# Patient Record
Sex: Male | Born: 1950
Health system: Southern US, Community
[De-identification: ages and names within clinical notes are randomized; demographics above are authoritative.]

## PROBLEM LIST (undated history)

## (undated) DIAGNOSIS — K219 Gastro-esophageal reflux disease without esophagitis: Secondary | ICD-10-CM

## (undated) DIAGNOSIS — R918 Other nonspecific abnormal finding of lung field: Secondary | ICD-10-CM

## (undated) DIAGNOSIS — Z77090 Contact with and (suspected) exposure to asbestos: Secondary | ICD-10-CM

## (undated) DIAGNOSIS — I10 Essential (primary) hypertension: Secondary | ICD-10-CM

## (undated) DIAGNOSIS — J449 Chronic obstructive pulmonary disease, unspecified: Secondary | ICD-10-CM

## (undated) HISTORY — DX: Gastro-esophageal reflux disease without esophagitis: K21.9

---

## 1998-04-10 ENCOUNTER — Ambulatory Visit (HOSPITAL_COMMUNITY): Admission: RE | Admit: 1998-04-10 | Discharge: 1998-04-10 | Payer: Self-pay | Admitting: Cardiology

## 1999-01-20 ENCOUNTER — Ambulatory Visit (HOSPITAL_COMMUNITY): Admission: RE | Admit: 1999-01-20 | Discharge: 1999-01-20 | Payer: Self-pay | Admitting: *Deleted

## 1999-01-20 ENCOUNTER — Encounter: Payer: Self-pay | Admitting: Family Medicine

## 1999-03-08 ENCOUNTER — Ambulatory Visit: Admission: RE | Admit: 1999-03-08 | Discharge: 1999-03-08 | Payer: Self-pay | Admitting: Internal Medicine

## 2007-11-20 ENCOUNTER — Ambulatory Visit: Payer: Self-pay | Admitting: Internal Medicine

## 2007-12-03 ENCOUNTER — Encounter: Payer: Self-pay | Admitting: Internal Medicine

## 2007-12-03 ENCOUNTER — Ambulatory Visit: Payer: Self-pay | Admitting: Internal Medicine

## 2010-12-21 ENCOUNTER — Encounter: Payer: Self-pay | Admitting: Internal Medicine

## 2011-01-20 NOTE — Letter (Signed)
Summary: Colonoscopy Letter  [facility]  [address]   [place], [place] [address]   Phone: [phone]  Fax: [phone]      December 21, 2010 MRN: [id number]   ANGELICA WIX [address] [place]  [address]   Dear Mr. SELSOR,   According to your medical record, it is time for you to schedule a Colonoscopy. The American Cancer Society recommends this procedure as a method to detect early colon cancer. Patients with a family history of colon cancer, or a personal history of colon polyps or inflammatory bowel disease are at increased risk.  This letter has been generated based on the recommendations made at the time of your procedure. If you feel that in your particular situation this may no longer apply, please contact our office.  Please call our office at [phone] to schedule this appointment or to update your records at your earliest convenience.  Thank you for cooperating with [place] to provide you with the very best care possible.   Sincerely,   Stan Head, M.D.  [facility] [phone]

## 2012-09-04 ENCOUNTER — Encounter: Payer: Self-pay | Admitting: Internal Medicine

## 2013-05-07 ENCOUNTER — Other Ambulatory Visit: Payer: Self-pay

## 2013-05-07 MED ORDER — OMEPRAZOLE 40 MG PO CPDR: 40.0000 mg | DELAYED_RELEASE_CAPSULE | Freq: Every day | ORAL | Status: DC

## 2013-05-09 ENCOUNTER — Other Ambulatory Visit: Payer: Self-pay

## 2013-05-09 MED ORDER — OMEPRAZOLE 40 MG PO CPDR: 40.0000 mg | DELAYED_RELEASE_CAPSULE | Freq: Every day | ORAL | Status: DC

## 2013-05-09 NOTE — Telephone Encounter (Signed)
Reorder on 05/07/13 for 30 but pharmacy sending back asking for 90 capsules for insurance to pay.  Patient has not been seen since 11/13

## 2013-12-24 ENCOUNTER — Other Ambulatory Visit: Payer: Self-pay | Admitting: Nurse Practitioner

## 2013-12-31 ENCOUNTER — Other Ambulatory Visit: Payer: Self-pay | Admitting: *Deleted

## 2013-12-31 MED ORDER — HYDROCHLOROTHIAZIDE 12.5 MG PO TABS: 12.5000 mg | ORAL_TABLET | Freq: Every day | ORAL | Status: DC

## 2013-12-31 MED ORDER — OMEPRAZOLE 40 MG PO CPDR: 40.0000 mg | DELAYED_RELEASE_CAPSULE | Freq: Every day | ORAL | Status: DC

## 2014-01-27 ENCOUNTER — Encounter: Payer: Self-pay | Admitting: Family Medicine

## 2014-01-27 ENCOUNTER — Ambulatory Visit (INDEPENDENT_AMBULATORY_CARE_PROVIDER_SITE_OTHER): Payer: 59 | Admitting: Family Medicine

## 2014-01-27 VITALS — BP 137/87 | HR 68 | Temp 97.3°F | Ht 69.5 in | Wt 168.0 lb

## 2014-01-27 DIAGNOSIS — I10 Essential (primary) hypertension: Secondary | ICD-10-CM

## 2014-01-27 LAB — POCT CBC
Granulocyte percent: 58.5 %G (ref 37–80)
HCT, POC: 45.3 % (ref 43.5–53.7)
Hemoglobin: 14.2 g/dL (ref 14.1–18.1)
Lymph, poc: 2.2 (ref 0.6–3.4)
MCH, POC: 26.2 pg — AB (ref 27–31.2)
MCHC: 31.4 g/dL — AB (ref 31.8–35.4)
MCV: 83.3 fL (ref 80–97)
MPV: 9 fL (ref 0–99.8)
POC Granulocyte: 3.2 (ref 2–6.9)
POC LYMPH PERCENT: 39.7 %L (ref 10–50)
Platelet Count, POC: 164 10*3/uL (ref 142–424)
RBC: 5.4 M/uL (ref 4.69–6.13)
RDW, POC: 14 %
WBC: 5.5 10*3/uL (ref 4.6–10.2)

## 2014-01-27 MED ORDER — OMEPRAZOLE 40 MG PO CPDR: 40.0000 mg | DELAYED_RELEASE_CAPSULE | Freq: Every day | ORAL | Status: DC

## 2014-01-27 MED ORDER — HYDROCHLOROTHIAZIDE 12.5 MG PO TABS: 12.5000 mg | ORAL_TABLET | Freq: Every day | ORAL | Status: DC

## 2014-01-27 NOTE — Patient Instructions (Signed)
c Hypertension As your heart beats, it forces blood through your arteries. This force is your blood pressure. If the pressure is too high, it is called hypertension (HTN) or high blood pressure. HTN is dangerous because you may have it and not know it. High blood pressure may mean that your heart has to work harder to pump blood. Your arteries may be narrow or stiff. The extra work puts you at risk for heart disease, stroke, and other problems.  Blood pressure consists of two numbers, a higher number over a lower, 110/72, for example. It is stated as "110 over 72." The ideal is below 120 for the top number (systolic) and under 80 for the bottom (diastolic). Write down your blood pressure today. You should pay close attention to your blood pressure if you have certain conditions such as:  Heart failure.  Prior heart attack.  Diabetes  Chronic kidney disease.  Prior stroke.  Multiple risk factors for heart disease. To see if you have HTN, your blood pressure should be measured while you are seated with your arm held at the level of the heart. It should be measured at least twice. A one-time elevated blood pressure reading (especially in the Emergency Department) does not mean that you need treatment. There may be conditions in which the blood pressure is different between your right and left arms. It is important to see your caregiver soon for a recheck. Most people have essential hypertension which means that there is not a specific cause. This type of high blood pressure may be lowered by changing lifestyle factors such as:  Stress.  Smoking.  Lack of exercise.  Excessive weight.  Drug/tobacco/alcohol use.  Eating less salt. Most people do not have symptoms from high blood pressure until it has caused damage to the body. Effective treatment can often prevent, delay or reduce that damage. TREATMENT  When a cause has been identified, treatment for high blood pressure is directed at the  cause. There are a large number of medications to treat HTN. These fall into several categories, and your caregiver will help you select the medicines that are best for you. Medications may have side effects. You should review side effects with your caregiver. If your blood pressure stays high after you have made lifestyle changes or started on medicines,   Your medication(s) may need to be changed.  Other problems may need to be addressed.  Be certain you understand your prescriptions, and know how and when to take your medicine.  Be sure to follow up with your caregiver within the time frame advised (usually within two weeks) to have your blood pressure rechecked and to review your medications.  If you are taking more than one medicine to lower your blood pressure, make sure you know how and at what times they should be taken. Taking two medicines at the same time can result in blood pressure that is too low. SEEK IMMEDIATE MEDICAL CARE IF:  You develop a severe headache, blurred or changing vision, or confusion.  You have unusual weakness or numbness, or a faint feeling.  You have severe chest or abdominal pain, vomiting, or breathing problems. MAKE SURE YOU:   Understand these instructions.  Will watch your condition.  Will get help right away if you are not doing well or get worse. Document Released: 12/05/2005 Document Revised: 02/27/2012 Document Reviewed: 07/25/2008 [facility] Patient Information 2014 [place], [place].

## 2014-01-27 NOTE — Progress Notes (Signed)
   Subjective:    Patient ID: JACOBEY GURA, male    DOB: [birth year], 63 y.o.   MRN: [id number]  HPI  This 63 y.o. male presents for evaluation of needing refill on meds.  He has hx of GERD and hypertensoin.  Review of Systems No chest pain, SOB, HA, dizziness, vision change, N/V, diarrhea, constipation, dysuria, urinary urgency or frequency, myalgias, arthralgias or rash.     Objective:   Physical Exam  Vital signs noted  Well developed well nourished male.  HEENT - Head atraumatic Normocephalic                Eyes - PERRLA, Conjuctiva - clear Sclera- Clear EOMI                Ears - EAC's Wnl TM's Wnl Gross Hearing WNL                Nose - Nares patent                 Throat - oropharanx wnl Respiratory - Lungs CTA bilateral Cardiac - RRR S1 and S2 without murmur GI - Abdomen soft Nontender and bowel sounds active x 4 Extremities - No edema. Neuro - Grossly intact.      Assessment & Plan:  Essential hypertension, benign - Plan: POCT CBC, CMP14+EGFR, TSH, PSA, total and free, Lipid panel  Lysbeth Penner FNP

## 2014-01-28 LAB — CMP14+EGFR
ALT: 16 IU/L (ref 0–44)
AST: 19 IU/L (ref 0–40)
Albumin/Globulin Ratio: 1.3 (ref 1.1–2.5)
Albumin: 4 g/dL (ref 3.6–4.8)
Alkaline Phosphatase: 72 IU/L (ref 39–117)
BUN/Creatinine Ratio: 10 (ref 10–22)
BUN: 12 mg/dL (ref 8–27)
CO2: 23 mmol/L (ref 18–29)
Calcium: 9.6 mg/dL (ref 8.6–10.2)
Chloride: 100 mmol/L (ref 97–108)
Creatinine, Ser: 1.24 mg/dL (ref 0.76–1.27)
GFR calc Af Amer: 71 mL/min/{1.73_m2} (ref >59)
GFR calc non Af Amer: 61 mL/min/{1.73_m2} (ref >59)
Globulin, Total: 3.1 g/dL (ref 1.5–4.5)
Glucose: 99 mg/dL (ref 65–99)
Potassium: 4.6 mmol/L (ref 3.5–5.2)
Sodium: 141 mmol/L (ref 134–144)
Total Bilirubin: 0.2 mg/dL (ref 0.0–1.2)
Total Protein: 7.1 g/dL (ref 6.0–8.5)

## 2014-01-28 LAB — LIPID PANEL
Chol/HDL Ratio: 3.9 ratio units (ref 0.0–5.0)
Cholesterol, Total: 185 mg/dL (ref 100–199)
HDL: 47 mg/dL (ref >39)
LDL Calculated: 128 mg/dL — ABNORMAL HIGH (ref 0–99)
Triglycerides: 50 mg/dL (ref 0–149)
VLDL Cholesterol Cal: 10 mg/dL (ref 5–40)

## 2014-01-28 LAB — PSA, TOTAL AND FREE
PSA, Free Pct: 31.9 %
PSA, Free: 0.51 ng/mL
PSA: 1.6 ng/mL (ref 0.0–4.0)

## 2014-01-28 LAB — TSH: TSH: 0.852 u[IU]/mL (ref 0.450–4.500)

## 2014-03-19 ENCOUNTER — Other Ambulatory Visit: Payer: Self-pay | Admitting: *Deleted

## 2014-03-19 MED ORDER — HYDROCHLOROTHIAZIDE 12.5 MG PO TABS: 12.5000 mg | ORAL_TABLET | Freq: Every day | ORAL | Status: DC

## 2014-04-26 ENCOUNTER — Other Ambulatory Visit: Payer: Self-pay | Admitting: Family Medicine

## 2014-08-15 ENCOUNTER — Other Ambulatory Visit: Payer: Self-pay | Admitting: Family Medicine

## 2014-11-12 ENCOUNTER — Other Ambulatory Visit: Payer: Self-pay | Admitting: Family Medicine

## 2014-11-12 MED ORDER — OMEPRAZOLE 40 MG PO CPDR
40.0000 mg | DELAYED_RELEASE_CAPSULE | Freq: Every day | ORAL | Status: DC
Start: 2014-11-12 — End: 2015-01-14

## 2014-11-12 MED ORDER — HYDROCHLOROTHIAZIDE 12.5 MG PO TABS: 12.5000 mg | ORAL_TABLET | Freq: Every day | ORAL | Status: DC

## 2014-11-12 NOTE — Telephone Encounter (Signed)
done

## 2015-01-14 ENCOUNTER — Other Ambulatory Visit: Payer: Self-pay | Admitting: *Deleted

## 2015-01-14 MED ORDER — OMEPRAZOLE 40 MG PO CPDR: 40.0000 mg | DELAYED_RELEASE_CAPSULE | Freq: Every day | ORAL | Status: DC

## 2015-01-27 ENCOUNTER — Ambulatory Visit (INDEPENDENT_AMBULATORY_CARE_PROVIDER_SITE_OTHER): Payer: 59 | Admitting: Family Medicine

## 2015-01-27 ENCOUNTER — Encounter: Payer: Self-pay | Admitting: Family Medicine

## 2015-01-27 VITALS — BP 151/90 | HR 104 | Temp 97.8°F | Ht 69.5 in | Wt 169.0 lb

## 2015-01-27 DIAGNOSIS — K2100 Gastro-esophageal reflux disease with esophagitis, without bleeding: Secondary | ICD-10-CM

## 2015-01-27 DIAGNOSIS — I10 Essential (primary) hypertension: Secondary | ICD-10-CM

## 2015-01-27 NOTE — Progress Notes (Signed)
Subjective:  Patient ID: Zachary [name], male    DOB: 1951-07-26  Age: 64 y.o. MRN: [id number]  CC: Hypertension and Gastrophageal Reflux   HPI Zachary [name] presents for Patient in for follow-up of hypertension. Patient has no history of headache chest pain or shortness of breath or recent cough. Patient also denies symptoms of TIA such as numbness weakness lateralizing. Patient checks  blood pressure at home and has not had any elevated readings recently. Patient denies side effects from his medication. States taking it regularly. Patient in for follow-up of GERD. He is currently asymptomatic taking his PPI daily. There is no chest pain or heartburn. He is taking the medication regularly. No dysphagia or choking. Patient apparently has history of elevated cholesterol currently not taking medication. He is not fasting.  History Vicki has no past medical history on file.   He has no past surgical history on file.   His family history is not on file.He reports that he has been smoking Cigarettes.  He started smoking about 41 years ago. He has been smoking about 0.10 packs per day. He does not have any smokeless tobacco history on file. He reports that he does not drink alcohol or use illicit drugs.  Current Outpatient Prescriptions on File Prior to Visit  Medication Sig Dispense Refill  . omeprazole (PRILOSEC) 40 MG capsule Take 1 capsule (40 mg total) by mouth daily. 30 capsule 0   No current facility-administered medications on file prior to visit.    ROS Review of Systems  Constitutional: Negative for fever, chills, diaphoresis and unexpected weight change.  HENT: Negative for congestion, hearing loss, rhinorrhea, sore throat and trouble swallowing.   Respiratory: Negative for cough, chest tightness, shortness of breath and wheezing.   Gastrointestinal: Negative for nausea, vomiting, abdominal pain, diarrhea, constipation and abdominal distention.  Endocrine: Negative for  cold intolerance and heat intolerance.  Genitourinary: Negative for dysuria, hematuria and flank pain.  Musculoskeletal: Negative for joint swelling and arthralgias.  Skin: Negative for rash.  Neurological: Negative for dizziness and headaches.  Psychiatric/Behavioral: Negative for dysphoric mood, decreased concentration and agitation. The patient is not nervous/anxious.     Objective:  BP 151/90 mmHg  Pulse 104  Temp(Src) 97.8 F (36.6 C) (Oral)  Ht 5' 9.5" (1.765 m)  Wt 169 lb (76.658 kg)  BMI 24.61 kg/m2  BP Readings from Last 3 Encounters:  01/27/15 151/90  01/27/14 137/87    Wt Readings from Last 3 Encounters:  01/27/15 169 lb (76.658 kg)  01/27/14 168 lb (76.204 kg)     Physical Exam  Constitutional: He is oriented to person, place, and time. He appears well-developed and well-nourished. No distress.  HENT:  Head: Normocephalic and atraumatic.  Right Ear: External ear normal.  Left Ear: External ear normal.  Nose: Nose normal.  Mouth/Throat: Oropharynx is clear and moist.  Eyes: Conjunctivae and EOM are normal. Pupils are equal, round, and reactive to light.  Neck: Normal range of motion. Neck supple. No thyromegaly present.  Cardiovascular: Normal rate, regular rhythm and normal heart sounds.   No murmur heard. Pulmonary/Chest: Effort normal and breath sounds normal. No respiratory distress. He has no wheezes. He has no rales.  Abdominal: Soft. Bowel sounds are normal. He exhibits no distension. There is no tenderness.  Lymphadenopathy:    He has no cervical adenopathy.  Neurological: He is alert and oriented to person, place, and time. He has normal reflexes.  Skin: Skin is warm and dry.  Psychiatric:  He has a normal mood and affect. His behavior is normal. Judgment and thought content normal.    No results found for: HGBA1C  Lab Results  Component Value Date   WBC 5.5 01/27/2014   HGB 14.2 01/27/2014   HCT 45.3 01/27/2014   GLUCOSE 99 01/27/2014    TRIG 50 01/27/2014   HDL 47 01/27/2014   LDLCALC 128* 01/27/2014   ALT 16 01/27/2014   AST 19 01/27/2014   NA 141 01/27/2014   K 4.6 01/27/2014   CL 100 01/27/2014   CREATININE 1.24 01/27/2014   BUN 12 01/27/2014   CO2 23 01/27/2014   TSH 0.852 01/27/2014   PSA 1.6 01/27/2014    No results found.  Assessment & Plan:   Sohail was seen today for hypertension and gastrophageal reflux.  Diagnoses and associated orders for this visit:  Essential hypertension, benign - CMP14+EGFR  Gastroesophageal reflux disease with esophagitis - CMP14+EGFR   I have discontinued Mr. Patella's hydrochlorothiazide. I am also having him maintain his omeprazole and hydrochlorothiazide.  Meds ordered this encounter  Medications  . hydrochlorothiazide (MICROZIDE) 12.5 MG capsule    Sig: Take 12.5 mg by mouth daily.    Refill:  0    Comments: Continue meds as is. Patient encouraged to follow DASH eating plan. Instructions printed. Monitor blood pressure closely at home. Log the results and bring them back to your follow-up in 6 months.  Follow-up: Return in about 6 months (around 07/28/2015) for CPE, cholesterol.  Claretta Fraise, M.D.

## 2015-01-27 NOTE — Patient Instructions (Signed)
DASH Eating Plan °DASH stands for "Dietary Approaches to Stop Hypertension." The DASH eating plan is a healthy eating plan that has been shown to reduce high blood pressure (hypertension). Additional health benefits may include reducing the risk of type 2 diabetes mellitus, heart disease, and stroke. The DASH eating plan may also help with weight loss. °WHAT DO I NEED TO KNOW ABOUT THE DASH EATING PLAN? °For the DASH eating plan, you will follow these general guidelines: °· Choose foods with a percent daily value for sodium of less than 5% (as listed on the food label). °· Use salt-free seasonings or herbs instead of table salt or sea salt. °· Check with your health care provider or pharmacist before using salt substitutes. °· Eat lower-sodium products, often labeled as "lower sodium" or "no salt added." °· Eat fresh foods. °· Eat more vegetables, fruits, and low-fat dairy products. °· Choose whole grains. Look for the word "whole" as the first word in the ingredient list. °· Choose fish and skinless chicken or turkey more often than red meat. Limit fish, poultry, and meat to 6 oz (170 g) each day. °· Limit sweets, desserts, sugars, and sugary drinks. °· Choose heart-healthy fats. °· Limit cheese to 1 oz (28 g) per day. °· Eat more home-cooked food and less restaurant, buffet, and fast food. °· Limit fried foods. °· Cook foods using methods other than frying. °· Limit canned vegetables. If you do use them, rinse them well to decrease the sodium. °· When eating at a restaurant, ask that your food be prepared with less salt, or no salt if possible. °WHAT FOODS CAN I EAT? °Seek help from a dietitian for individual calorie needs. °Grains °Whole grain or whole wheat bread. Brown rice. Whole grain or whole wheat pasta. Quinoa, bulgur, and whole grain cereals. Low-sodium cereals. Corn or whole wheat flour tortillas. Whole grain cornbread. Whole grain crackers. Low-sodium crackers. °Vegetables °Fresh or frozen vegetables  (raw, steamed, roasted, or grilled). Low-sodium or reduced-sodium tomato and vegetable juices. Low-sodium or reduced-sodium tomato sauce and paste. Low-sodium or reduced-sodium canned vegetables.  °Fruits °All fresh, canned (in natural juice), or frozen fruits. °Meat and Other Protein Products °Ground beef (85% or leaner), grass-fed beef, or beef trimmed of fat. Skinless chicken or turkey. Ground chicken or turkey. Pork trimmed of fat. All fish and seafood. Eggs. Dried beans, peas, or lentils. Unsalted nuts and seeds. Unsalted canned beans. °Dairy °Low-fat dairy products, such as skim or 1% milk, 2% or reduced-fat cheeses, low-fat ricotta or cottage cheese, or plain low-fat yogurt. Low-sodium or reduced-sodium cheeses. °Fats and Oils °Tub margarines without trans fats. Light or reduced-fat mayonnaise and salad dressings (reduced sodium). Avocado. Safflower, olive, or canola oils. Natural peanut or almond butter. °Other °Unsalted popcorn and pretzels. °The items listed above may not be a complete list of recommended foods or beverages. Contact your dietitian for more options. °WHAT FOODS ARE NOT RECOMMENDED? °Grains °White bread. White pasta. White rice. Refined cornbread. Bagels and croissants. Crackers that contain trans fat. °Vegetables °Creamed or fried vegetables. Vegetables in a cheese sauce. Regular canned vegetables. Regular canned tomato sauce and paste. Regular tomato and vegetable juices. °Fruits °Dried fruits. Canned fruit in light or heavy syrup. Fruit juice. °Meat and Other Protein Products °Fatty cuts of meat. Ribs, chicken wings, bacon, sausage, bologna, salami, chitterlings, fatback, hot dogs, bratwurst, and packaged luncheon meats. Salted nuts and seeds. Canned beans with salt. °Dairy °Whole or 2% milk, cream, half-and-half, and cream cheese. Whole-fat or sweetened yogurt. Full-fat   cheeses or blue cheese. Nondairy creamers and whipped toppings. Processed cheese, cheese spreads, or cheese  curds. °Condiments °Onion and garlic salt, seasoned salt, table salt, and sea salt. Canned and packaged gravies. Worcestershire sauce. Tartar sauce. Barbecue sauce. Teriyaki sauce. Soy sauce, including reduced sodium. Steak sauce. Fish sauce. Oyster sauce. Cocktail sauce. Horseradish. Ketchup and mustard. Meat flavorings and tenderizers. Bouillon cubes. Hot sauce. Tabasco sauce. Marinades. Taco seasonings. Relishes. °Fats and Oils °Butter, stick margarine, lard, shortening, ghee, and bacon fat. Coconut, palm kernel, or palm oils. Regular salad dressings. °Other °Pickles and olives. Salted popcorn and pretzels. °The items listed above may not be a complete list of foods and beverages to avoid. Contact your dietitian for more information. °WHERE CAN I FIND MORE INFORMATION? °National Heart, [employer], and [employer] Institute: www.nhlbi.nih.gov/health/health-topics/topics/dash/ °Document Released: 11/24/2011 Document Revised: 04/21/2014 Document Reviewed: 10/09/2013 °ExitCare® Patient Information ©2015 [employer]. This information is not intended to replace advice given to you by your health care provider. Make sure you discuss any questions you have with your health care provider. ° °

## 2015-01-28 LAB — CMP14+EGFR
A/G RATIO: 1.1 (ref 1.1–2.5)
ALT: 12 IU/L (ref 0–44)
AST: 16 IU/L (ref 0–40)
Albumin: 4.1 g/dL (ref 3.6–4.8)
Alkaline Phosphatase: 79 IU/L (ref 39–117)
BILIRUBIN TOTAL: 0.4 mg/dL (ref 0.0–1.2)
BUN / CREAT RATIO: 10 (ref 10–22)
BUN: 13 mg/dL (ref 8–27)
CO2: 26 mmol/L (ref 18–29)
Calcium: 9.5 mg/dL (ref 8.6–10.2)
Chloride: 100 mmol/L (ref 97–108)
Creatinine, Ser: 1.25 mg/dL (ref 0.76–1.27)
GFR calc non Af Amer: 60 mL/min/{1.73_m2} (ref >59)
GFR, EST AFRICAN AMERICAN: 70 mL/min/{1.73_m2} (ref >59)
Globulin, Total: 3.7 g/dL (ref 1.5–4.5)
Glucose: 75 mg/dL (ref 65–99)
Potassium: 4.4 mmol/L (ref 3.5–5.2)
SODIUM: 141 mmol/L (ref 134–144)
TOTAL PROTEIN: 7.8 g/dL (ref 6.0–8.5)

## 2015-01-30 ENCOUNTER — Encounter: Payer: Self-pay | Admitting: Family Medicine

## 2015-01-30 ENCOUNTER — Telehealth: Payer: Self-pay | Admitting: *Deleted

## 2015-01-30 NOTE — Telephone Encounter (Signed)
-----   Message from Mechele Claude, MD sent at 01/29/2015  2:22 PM EST ----- Zachary [name],    Your lab result is normal.Some minor variations that are not significant are commonly marked abnormal, but do not represent any medical problem for you.  Best regards, Mechele Claude, M.D.

## 2015-01-30 NOTE — Telephone Encounter (Signed)
Called pt to go over test results. 

## 2015-02-11 ENCOUNTER — Other Ambulatory Visit: Payer: Self-pay | Admitting: Family Medicine

## 2015-04-02 ENCOUNTER — Other Ambulatory Visit: Payer: Self-pay | Admitting: *Deleted

## 2015-04-02 MED ORDER — OMEPRAZOLE 40 MG PO CPDR: 40.0000 mg | DELAYED_RELEASE_CAPSULE | Freq: Every day | ORAL | Status: DC

## 2015-04-23 ENCOUNTER — Other Ambulatory Visit: Payer: Self-pay

## 2015-04-23 MED ORDER — OMEPRAZOLE 40 MG PO CPDR: 40.0000 mg | DELAYED_RELEASE_CAPSULE | Freq: Every day | ORAL | Status: DC

## 2015-04-28 ENCOUNTER — Other Ambulatory Visit: Payer: Self-pay | Admitting: Family Medicine

## 2015-07-29 ENCOUNTER — Other Ambulatory Visit: Payer: Self-pay | Admitting: Family Medicine

## 2015-09-29 ENCOUNTER — Other Ambulatory Visit: Payer: Self-pay | Admitting: *Deleted

## 2015-09-29 MED ORDER — HYDROCHLOROTHIAZIDE 12.5 MG PO CAPS: 12.5000 mg | ORAL_CAPSULE | Freq: Every day | ORAL | Status: DC

## 2015-09-29 NOTE — Telephone Encounter (Signed)
Last seen 01/2015 

## 2015-10-07 ENCOUNTER — Telehealth: Payer: Self-pay | Admitting: Family Medicine

## 2015-10-28 ENCOUNTER — Encounter: Payer: 59 | Admitting: Family Medicine

## 2015-10-29 ENCOUNTER — Other Ambulatory Visit: Payer: Self-pay | Admitting: Family Medicine

## 2015-11-03 ENCOUNTER — Encounter: Payer: Self-pay | Admitting: Family Medicine

## 2015-11-03 ENCOUNTER — Ambulatory Visit (INDEPENDENT_AMBULATORY_CARE_PROVIDER_SITE_OTHER): Payer: 59 | Admitting: Family Medicine

## 2015-11-03 VITALS — BP 125/87 | HR 87 | Temp 97.1°F | Ht 69.5 in | Wt 159.8 lb

## 2015-11-03 DIAGNOSIS — I10 Essential (primary) hypertension: Secondary | ICD-10-CM

## 2015-11-03 DIAGNOSIS — Z0000 Encounter for general adult medical examination without abnormal findings: Secondary | ICD-10-CM

## 2015-11-03 DIAGNOSIS — K2100 Gastro-esophageal reflux disease with esophagitis, without bleeding: Secondary | ICD-10-CM

## 2015-11-03 MED ORDER — HYDROCHLOROTHIAZIDE 12.5 MG PO CAPS: 12.5000 mg | ORAL_CAPSULE | Freq: Every day | ORAL | Status: DC

## 2015-11-03 MED ORDER — OMEPRAZOLE 40 MG PO CPDR: DELAYED_RELEASE_CAPSULE | ORAL | Status: DC

## 2015-11-03 NOTE — Patient Instructions (Signed)
Your physical condition was excellent on today's exam.  Final evaluation of course is deferred until you're lab work has come back. You should get a phone call in the next few days.

## 2015-11-03 NOTE — Progress Notes (Signed)
Subjective:  Patient ID: Zachary [name], male    DOB: [birth year]  Age: 64 y.o. MRN: [id number]  CC: Annual Exam  Patient in for follow-up of GERD. Currently asymptomatic taking  PPI daily. There is no chest pain or heartburn. No hematemesis and no melena. No dysphagia or choking. Onset is remote. Progression is stable. Complicating factors, none.  follow-up of hypertension. Patient has no history of headache chest pain or shortness of breath or recent cough. Patient also denies symptoms of TIA such as numbness weakness lateralizing. Patient checks  blood pressure at home and has not had any elevated readings recently. Patient denies side effects from his medication. States taking it regularly.   HPI Zachary [name] presents for annual physical exam.  History Zachary [name] has a past medical history of Hypertension and GERD (gastroesophageal reflux disease).   He denies having had any surgery   His family history includes Heart disease in his father.He reports that he has been smoking Cigarettes.  He started smoking about 41 years ago. He has been smoking about 0.10 packs per day. He does not have any smokeless tobacco history on file. He reports that he does not drink alcohol or use illicit drugs.  Outpatient Prescriptions Prior to Visit  Medication Sig Dispense Refill  . hydrochlorothiazide (MICROZIDE) 12.5 MG capsule Take 1 capsule (12.5 mg total) by mouth daily. 30 capsule 0  . omeprazole (PRILOSEC) 40 MG capsule TAKE 1 CAPSULE (40 MG TOTAL) BY MOUTH DAILY. 90 capsule 0   No facility-administered medications prior to visit.    ROS Review of Systems  Constitutional: Negative for fever, chills, diaphoresis, activity change, appetite change, fatigue and unexpected weight change.  HENT: Negative for congestion, ear pain, hearing loss, postnasal drip, rhinorrhea, sore throat, tinnitus and trouble swallowing.   Eyes: Negative for photophobia, pain, discharge and redness.  Respiratory:  Negative for apnea, cough, choking, chest tightness, shortness of breath, wheezing and stridor.   Cardiovascular: Negative for chest pain, palpitations and leg swelling.  Gastrointestinal: Negative for nausea, vomiting, abdominal pain, diarrhea, constipation, blood in stool and abdominal distention.  Endocrine: Negative for cold intolerance, heat intolerance, polydipsia, polyphagia and polyuria.  Genitourinary: Negative for dysuria, urgency, frequency, hematuria, flank pain, enuresis, difficulty urinating and genital sores.  Musculoskeletal: Negative for joint swelling and arthralgias.  Skin: Negative for color change, rash and wound.  Allergic/Immunologic: Negative for immunocompromised state.  Neurological: Negative for dizziness, tremors, seizures, syncope, facial asymmetry, speech difficulty, weakness, light-headedness, numbness and headaches.  Hematological: Does not bruise/bleed easily.  Psychiatric/Behavioral: Negative for suicidal ideas, hallucinations, behavioral problems, confusion, sleep disturbance, dysphoric mood, decreased concentration and agitation. The patient is not nervous/anxious and is not hyperactive.     Objective:  BP 125/87 mmHg  Pulse 87  Temp(Src) 97.1 F (36.2 C) (Oral)  Ht 5' 9.5" (1.765 m)  Wt 159 lb 12.8 oz (72.485 kg)  BMI 23.27 kg/m2  SpO2 95%  BP Readings from Last 3 Encounters:  11/03/15 125/87  01/27/15 151/90  01/27/14 137/87    Wt Readings from Last 3 Encounters:  11/03/15 159 lb 12.8 oz (72.485 kg)  01/27/15 169 lb (76.658 kg)  01/27/14 168 lb (76.204 kg)     Physical Exam  Constitutional: He is oriented to person, place, and time. He appears well-developed and well-nourished.  HENT:  Head: Normocephalic and atraumatic.  Mouth/Throat: Oropharynx is clear and moist.  Eyes: EOM are normal. Pupils are equal, round, and reactive to light.  Neck: Normal range of motion.  No tracheal deviation present. No thyromegaly present.  Cardiovascular:  Normal rate, regular rhythm and normal heart sounds.  Exam reveals no gallop and no friction rub.   No murmur heard. Pulmonary/Chest: Breath sounds normal. He has no wheezes. He has no rales.  Abdominal: Soft. He exhibits no mass. There is no tenderness.  Genitourinary: Rectum normal, prostate normal and penis normal. No penile tenderness.  Musculoskeletal: Normal range of motion. He exhibits no edema.  Neurological: He is alert and oriented to person, place, and time. He has normal reflexes. No cranial nerve deficit. He exhibits normal muscle tone.  Skin: Skin is warm and dry.  Psychiatric: He has a normal mood and affect.    No results found for: HGBA1C  Lab Results  Component Value Date   WBC 4.6 11/03/2015   HGB 14.2 01/27/2014   HCT 44.2 11/03/2015   GLUCOSE 74 11/03/2015   CHOL 192 11/03/2015   TRIG 96 11/03/2015   HDL 43 11/03/2015   LDLCALC 130* 11/03/2015   ALT 14 11/03/2015   AST 19 11/03/2015   NA 140 11/03/2015   K 4.4 11/03/2015   CL 98 11/03/2015   CREATININE 1.19 11/03/2015   BUN 13 11/03/2015   CO2 26 11/03/2015   TSH 1.180 11/03/2015   PSA 2.6 11/03/2015    No results found.  Assessment & Plan:   Zachary [name] was seen today for annual exam.  Diagnoses and all orders for this visit:  Routine general medical examination at a health care facility -     CBC with Differential/Platelet -     Lipid panel -     PSA, total and free -     CMP14+EGFR -     TSH -     VITAMIN D 25 Hydroxy (Vit-D Deficiency, Fractures) -     Cancel: T4, Free  Gastroesophageal reflux disease with esophagitis  Essential hypertension, benign  Other orders -     hydrochlorothiazide (MICROZIDE) 12.5 MG capsule; Take 1 capsule (12.5 mg total) by mouth daily. -     omeprazole (PRILOSEC) 40 MG capsule; TAKE 1 CAPSULE (40 MG TOTAL) BY MOUTH DAILY.   I am having Mr. Azpeitia maintain his hydrochlorothiazide and omeprazole.  Meds ordered this encounter  Medications  .  hydrochlorothiazide (MICROZIDE) 12.5 MG capsule    Sig: Take 1 capsule (12.5 mg total) by mouth daily.    Dispense:  90 capsule    Refill:  4  . omeprazole (PRILOSEC) 40 MG capsule    Sig: TAKE 1 CAPSULE (40 MG TOTAL) BY MOUTH DAILY.    Dispense:  90 capsule    Refill:  4   Patient was counseled on appropriate diet. Low carbohydrate, low sodium, high protein approach. AVoiding alcohol, drugs reviewed. Discussed smoking cessation.  Immunizations - recommended zostavax, pneumovax, prevnar and TDaP  Regular exercise benefit with mix of cardio and resistance training was reviewed.  Reminded to wear seat belt when driving or a passenger.   Follow-up: Return in about 1 year (around 11/02/2016) for CPE.  Claretta Fraise, M.D.

## 2015-11-04 ENCOUNTER — Other Ambulatory Visit: Payer: Self-pay | Admitting: Family Medicine

## 2015-11-04 LAB — CBC WITH DIFFERENTIAL/PLATELET
BASOS ABS: 0 10*3/uL (ref 0.0–0.2)
Basos: 0 %
EOS (ABSOLUTE): 0.2 10*3/uL (ref 0.0–0.4)
Eos: 3 %
Hematocrit: 44.2 % (ref 37.5–51.0)
Hemoglobin: 15.1 g/dL (ref 12.6–17.7)
Immature Grans (Abs): 0 10*3/uL (ref 0.0–0.1)
Immature Granulocytes: 0 %
LYMPHS ABS: 1.9 10*3/uL (ref 0.7–3.1)
Lymphs: 42 %
MCH: 28.7 pg (ref 26.6–33.0)
MCHC: 34.2 g/dL (ref 31.5–35.7)
MCV: 84 fL (ref 79–97)
Monocytes Absolute: 0.4 10*3/uL (ref 0.1–0.9)
Monocytes: 8 %
Neutrophils Absolute: 2.1 10*3/uL (ref 1.4–7.0)
Neutrophils: 47 %
Platelets: 216 10*3/uL (ref 150–379)
RBC: 5.26 x10E6/uL (ref 4.14–5.80)
RDW: 13.7 % (ref 12.3–15.4)
WBC: 4.6 10*3/uL (ref 3.4–10.8)

## 2015-11-04 LAB — CMP14+EGFR
ALT: 14 IU/L (ref 0–44)
AST: 19 IU/L (ref 0–40)
Albumin/Globulin Ratio: 1 — ABNORMAL LOW (ref 1.1–2.5)
Albumin: 4.1 g/dL (ref 3.6–4.8)
Alkaline Phosphatase: 82 IU/L (ref 39–117)
BUN/Creatinine Ratio: 11 (ref 10–22)
BUN: 13 mg/dL (ref 8–27)
Bilirubin Total: 0.4 mg/dL (ref 0.0–1.2)
CALCIUM: 9.7 mg/dL (ref 8.6–10.2)
CO2: 26 mmol/L (ref 18–29)
CREATININE: 1.19 mg/dL (ref 0.76–1.27)
Chloride: 98 mmol/L (ref 97–106)
GFR calc Af Amer: 74 mL/min/{1.73_m2} (ref >59)
GFR, EST NON AFRICAN AMERICAN: 64 mL/min/{1.73_m2} (ref >59)
GLOBULIN, TOTAL: 4.2 g/dL (ref 1.5–4.5)
Glucose: 74 mg/dL (ref 65–99)
Potassium: 4.4 mmol/L (ref 3.5–5.2)
Sodium: 140 mmol/L (ref 136–144)
Total Protein: 8.3 g/dL (ref 6.0–8.5)

## 2015-11-04 LAB — LIPID PANEL
CHOLESTEROL TOTAL: 192 mg/dL (ref 100–199)
Chol/HDL Ratio: 4.5 ratio units (ref 0.0–5.0)
HDL: 43 mg/dL (ref >39)
LDL Calculated: 130 mg/dL — ABNORMAL HIGH (ref 0–99)
Triglycerides: 96 mg/dL (ref 0–149)
VLDL CHOLESTEROL CAL: 19 mg/dL (ref 5–40)

## 2015-11-04 LAB — VITAMIN D 25 HYDROXY (VIT D DEFICIENCY, FRACTURES): VIT D 25 HYDROXY: 22.2 ng/mL — AB (ref 30.0–100.0)

## 2015-11-04 LAB — PSA, TOTAL AND FREE
PSA FREE: 0.67 ng/mL
PSA, Free Pct: 25.8 %
Prostate Specific Ag, Serum: 2.6 ng/mL (ref 0.0–4.0)

## 2015-11-04 LAB — TSH: TSH: 1.18 u[IU]/mL (ref 0.450–4.500)

## 2015-11-05 ENCOUNTER — Other Ambulatory Visit: Payer: Self-pay | Admitting: Family Medicine

## 2015-11-05 MED ORDER — VITAMIN D (ERGOCALCIFEROL) 1.25 MG (50000 UNIT) PO CAPS: 50000.0000 [IU] | ORAL_CAPSULE | ORAL | Status: DC

## 2015-11-08 ENCOUNTER — Encounter: Payer: Self-pay | Admitting: Family Medicine

## 2016-05-21 ENCOUNTER — Ambulatory Visit (INDEPENDENT_AMBULATORY_CARE_PROVIDER_SITE_OTHER): Payer: Self-pay | Admitting: Family

## 2016-05-21 ENCOUNTER — Encounter: Payer: Self-pay | Admitting: Family

## 2016-05-21 VITALS — BP 143/87 | HR 116 | Temp 97.1°F | Ht 70.0 in | Wt 141.4 lb

## 2016-05-21 DIAGNOSIS — J209 Acute bronchitis, unspecified: Secondary | ICD-10-CM

## 2016-05-21 DIAGNOSIS — F17200 Nicotine dependence, unspecified, uncomplicated: Secondary | ICD-10-CM

## 2016-05-21 DIAGNOSIS — Z720 Tobacco use: Secondary | ICD-10-CM

## 2016-05-21 DIAGNOSIS — R062 Wheezing: Secondary | ICD-10-CM

## 2016-05-21 MED ORDER — FLUTICASONE FUROATE-VILANTEROL 100-25 MCG/INH IN AEPB: 1.0000 | INHALATION_SPRAY | Freq: Every day | RESPIRATORY_TRACT | Status: DC

## 2016-05-21 MED ORDER — DOXYCYCLINE HYCLATE 100 MG PO TABS: 100.0000 mg | ORAL_TABLET | Freq: Two times a day (BID) | ORAL | Status: DC

## 2016-05-21 MED ORDER — BENZONATATE 200 MG PO CAPS: 200.0000 mg | ORAL_CAPSULE | Freq: Three times a day (TID) | ORAL | Status: DC | PRN

## 2016-05-21 MED ORDER — PREDNISONE 10 MG (21) PO TBPK: 10.0000 mg | ORAL_TABLET | Freq: Every day | ORAL | Status: DC

## 2016-05-21 NOTE — Patient Instructions (Signed)
Acute Bronchitis Bronchitis is inflammation of the airways that extend from the windpipe into the lungs (bronchi). The inflammation often causes mucus to develop. This leads to a cough, which is the most common symptom of bronchitis.  In acute bronchitis, the condition usually develops suddenly and goes away over time, usually in a couple weeks. Smoking, allergies, and asthma can make bronchitis worse. Repeated episodes of bronchitis may cause further lung problems.  CAUSES Acute bronchitis is most often caused by the same virus that causes a cold. The virus can spread from person to person (contagious) through coughing, sneezing, and touching contaminated objects. SIGNS AND SYMPTOMS   Cough.   Fever.   Coughing up mucus.   Body aches.   Chest congestion.   Chills.   Shortness of breath.   Sore throat.  DIAGNOSIS  Acute bronchitis is usually diagnosed through a physical exam. Your health care provider will also ask you questions about your medical history. Tests, such as chest X-rays, are sometimes done to rule out other conditions.  TREATMENT  Acute bronchitis usually goes away in a couple weeks. Oftentimes, no medical treatment is necessary. Medicines are sometimes given for relief of fever or cough. Antibiotic medicines are usually not needed but may be prescribed in certain situations. In some cases, an inhaler may be recommended to help reduce shortness of breath and control the cough. A cool mist vaporizer may also be used to help thin bronchial secretions and make it easier to clear the chest.  HOME CARE INSTRUCTIONS  Get plenty of rest.   Drink enough fluids to keep your urine clear or pale yellow (unless you have a medical condition that requires fluid restriction). Increasing fluids may help thin your respiratory secretions (sputum) and reduce chest congestion, and it will prevent dehydration.   Take medicines only as directed by your health care provider.  If  you were prescribed an antibiotic medicine, finish it all even if you start to feel better.  Avoid smoking and secondhand smoke. Exposure to cigarette smoke or irritating chemicals will make bronchitis worse. If you are a smoker, consider using nicotine gum or skin patches to help control withdrawal symptoms. Quitting smoking will help your lungs heal faster.   Reduce the chances of another bout of acute bronchitis by washing your hands frequently, avoiding people with cold symptoms, and trying not to touch your hands to your mouth, nose, or eyes.   Keep all follow-up visits as directed by your health care provider.  SEEK MEDICAL CARE IF: Your symptoms do not improve after 1 week of treatment.  SEEK IMMEDIATE MEDICAL CARE IF:  You develop an increased fever or chills.   You have chest pain.   You have severe shortness of breath.  You have bloody sputum.   You develop dehydration.  You faint or repeatedly feel like you are going to pass out.  You develop repeated vomiting.  You develop a severe headache. MAKE SURE YOU:   Understand these instructions.  Will watch your condition.  Will get help right away if you are not doing well or get worse.   This information is not intended to replace advice given to you by your health care provider. Make sure you discuss any questions you have with your health care provider.   Document Released: 01/12/2005 Document Revised: 12/26/2014 Document Reviewed: 05/28/2013 Elsevier Interactive Patient Education 2016 Elsevier Inc.  - Take meds as prescribed - Use a cool mist humidifier  -Use saline nose sprays frequently -Saline   irrigations of the nose can be very helpful if done frequently.  * 4X daily for 1 week*  * Use of a nettie pot can be helpful with this. Follow directions with this* -Force fluids -For any cough or congestion  Use plain Mucinex- regular strength or max strength is fine   * Children- consult with Pharmacist for  dosing -For fever or aces or pains- take tylenol or ibuprofen appropriate for age and weight.  * for fevers greater than 101 orally you may alternate ibuprofen and tylenol every  3 hours. -Throat lozenges if help    Diallo Ponder, FNP  

## 2016-05-21 NOTE — Progress Notes (Signed)
Subjective:    Patient ID: Zachary [name], male    DOB: [birth year], 65 y.o.   MRN: [id number]  Pt presents to the office today with complaints of cough. PT had pneumonia 05/12 and was Levaquin. Pt states  His cough is improved, but continues to have productive thick white cough, night sweats, and fatigue. PT has Asbestosis and is scheduled for another CT scan on 06/12 and has a pulmonologist. Cough The current episode started 1 to 4 weeks ago. The problem has been waxing and waning. The problem occurs every few minutes. The cough is productive of sputum. Associated symptoms include rhinorrhea, shortness of breath, sweats and weight loss. Pertinent negatives include no chills, ear congestion, ear pain, fever, myalgias, nasal congestion, postnasal drip, sore throat or wheezing. The symptoms are aggravated by lying down. Risk factors for lung disease include smoking/tobacco exposure. He has tried rest for the symptoms. The treatment provided mild relief. There is no history of COPD.      Review of Systems  Constitutional: Positive for weight loss. Negative for fever and chills.  HENT: Positive for rhinorrhea. Negative for ear pain, postnasal drip and sore throat.   Respiratory: Positive for cough and shortness of breath. Negative for wheezing.   Musculoskeletal: Negative for myalgias.  All other systems reviewed and are negative.      Objective:   Physical Exam  Constitutional: He is oriented to person, place, and time. He appears well-developed and well-nourished. No distress.  HENT:  Head: Normocephalic.  Right Ear: External ear normal.  Left Ear: External ear normal.  Mouth/Throat: Oropharynx is clear and moist.  Nasal passage erythemas with mild swelling    Eyes: Pupils are equal, round, and reactive to light. Right eye exhibits no discharge. Left eye exhibits no discharge.  Neck: Normal range of motion. Neck supple. No thyromegaly present.  Cardiovascular: Normal rate,  regular rhythm, normal heart sounds and intact distal pulses.   No murmur heard. Pulmonary/Chest: Effort normal. No respiratory distress. He has wheezes.  Abdominal: Soft. Bowel sounds are normal. He exhibits no distension. There is no tenderness.  Musculoskeletal: Normal range of motion. He exhibits no edema or tenderness.  Neurological: He is alert and oriented to person, place, and time.  Skin: Skin is warm and dry. No rash noted. No erythema.  Psychiatric: He has a normal mood and affect. His behavior is normal. Judgment and thought content normal.  Vitals reviewed.   BP 143/87 mmHg  Pulse 116  Temp(Src) 97.1 F (36.2 C) (Oral)  Ht 5\' 10"  (1.778 m)  Wt 141 lb 6.4 oz (64.139 kg)  BMI 20.29 kg/m2       Assessment & Plan:  1. Acute bronchitis, unspecified organism -- Take meds as prescribed - Use a cool mist humidifier  -Use saline nose sprays frequently -Saline irrigations of the nose can be very helpful if done frequently.  * 4X daily for 1 week*  * Use of a nettie pot can be helpful with this. Follow directions with this* -Force fluids -For any cough or congestion  Use plain Mucinex- regular strength or max strength is fine   * Children- consult with Pharmacist for dosing -For fever or aces or pains- take tylenol or ibuprofen appropriate for age and weight.  * for fevers greater than 101 orally you may alternate ibuprofen and tylenol every  3 hours. -Throat lozenges if help - doxycycline (VIBRA-TABS) 100 MG tablet; Take 1 tablet (100 mg total) by mouth 2 (two) times daily.  Dispense: 20 tablet; Refill: 0 - predniSONE (STERAPRED UNI-PAK 21 TAB) 10 MG (21) TBPK tablet; Take 1 tablet (10 mg total) by mouth daily. As directed x 6 days  Dispense: 21 tablet; Refill: 0 - benzonatate (TESSALON) 200 MG capsule; Take 1 capsule (200 mg total) by mouth 3 (three) times daily as needed.  Dispense: 30 capsule; Refill: 1  2. Wheezing -Smoking cessation discussed - predniSONE (STERAPRED  UNI-PAK 21 TAB) 10 MG (21) TBPK tablet; Take 1 tablet (10 mg total) by mouth daily. As directed x 6 days  Dispense: 21 tablet; Refill: 0 - fluticasone furoate-vilanterol (BREO ELLIPTA) 100-25 MCG/INH AEPB; Inhale 1 puff into the lungs daily.  Dispense: 1 each; Refill: 3  3. Current smoker -Smoking cessation discussed - fluticasone furoate-vilanterol (BREO ELLIPTA) 100-25 MCG/INH AEPB; Inhale 1 puff into the lungs daily.  Dispense: 1 each; Refill: 3  Jannifer Rodney, FNP

## 2016-06-07 ENCOUNTER — Ambulatory Visit: Payer: Self-pay | Admitting: Family

## 2016-06-07 ENCOUNTER — Telehealth: Payer: Self-pay | Admitting: Family Medicine

## 2016-06-07 NOTE — Telephone Encounter (Signed)
Pt not any better from 6/2 visit, while on prednisone some better but that was a short term dose. Suggested an appt. Which was made for this evening with the possibility of a CXR being done

## 2016-06-16 ENCOUNTER — Ambulatory Visit: Payer: Self-pay | Admitting: Family

## 2016-06-22 ENCOUNTER — Encounter: Payer: Self-pay | Admitting: Family

## 2016-06-22 ENCOUNTER — Ambulatory Visit (INDEPENDENT_AMBULATORY_CARE_PROVIDER_SITE_OTHER): Payer: Medicare Other | Admitting: Family

## 2016-06-22 VITALS — BP 124/85 | HR 112 | Temp 97.5°F | Ht 70.0 in | Wt 144.6 lb

## 2016-06-22 DIAGNOSIS — R5383 Other fatigue: Secondary | ICD-10-CM

## 2016-06-22 DIAGNOSIS — F17200 Nicotine dependence, unspecified, uncomplicated: Secondary | ICD-10-CM

## 2016-06-22 DIAGNOSIS — Z720 Tobacco use: Secondary | ICD-10-CM

## 2016-06-22 DIAGNOSIS — R062 Wheezing: Secondary | ICD-10-CM

## 2016-06-22 DIAGNOSIS — R634 Abnormal weight loss: Secondary | ICD-10-CM | POA: Diagnosis not present

## 2016-06-22 DIAGNOSIS — J209 Acute bronchitis, unspecified: Secondary | ICD-10-CM | POA: Diagnosis not present

## 2016-06-22 MED ORDER — FLUTICASONE FUROATE-VILANTEROL 100-25 MCG/INH IN AEPB: 1.0000 | INHALATION_SPRAY | Freq: Every day | RESPIRATORY_TRACT | Status: DC

## 2016-06-22 MED ORDER — PREDNISONE 10 MG (21) PO TBPK: 10.0000 mg | ORAL_TABLET | Freq: Every day | ORAL | Status: DC

## 2016-06-22 NOTE — Progress Notes (Signed)
Subjective:    Patient ID: Zachary [name], male    DOB: [birth year], 65 y.o.   MRN: [id number]  PT presents to the office today recheck acute bronchitis. Pt states he is doing much better, but continues to cough and feels weak. Pt states he has gained 3 lbs of the 19 lbs he lost. Pt states he was taking Breo that helped, but needs a new rx.  Cough This is a recurrent problem. The current episode started 1 to 4 weeks ago. The problem has been waxing and waning. The problem occurs every few minutes. The cough is productive of purulent sputum. Associated symptoms include shortness of breath and wheezing. Pertinent negatives include no chills, ear congestion, ear pain, fever, nasal congestion, postnasal drip, rhinorrhea or sore throat. The symptoms are aggravated by lying down. He has tried rest and OTC cough suppressant for the symptoms. The treatment provided mild relief. His past medical history is significant for COPD. There is no history of asthma.      Review of Systems  Constitutional: Negative for fever and chills.  HENT: Negative.  Negative for ear pain, postnasal drip, rhinorrhea and sore throat.   Respiratory: Positive for cough, shortness of breath and wheezing.   Cardiovascular: Negative.   Gastrointestinal: Negative.   Endocrine: Negative.   Genitourinary: Negative.   Musculoskeletal: Negative.   Neurological: Negative.   Hematological: Negative.   Psychiatric/Behavioral: Negative.   All other systems reviewed and are negative.      Objective:   Physical Exam  Constitutional: He is oriented to person, place, and time. He appears well-developed and well-nourished. No distress.  HENT:  Head: Normocephalic.  Right Ear: External ear normal.  Left Ear: External ear normal.  Mouth/Throat: Oropharynx is clear and moist.  Nasal passage erythemas with mild swelling    Eyes: Pupils are equal, round, and reactive to light. Right eye exhibits no discharge. Left eye exhibits  no discharge.  Neck: Normal range of motion. Neck supple. No thyromegaly present.  Cardiovascular: Normal rate, regular rhythm, normal heart sounds and intact distal pulses.   No murmur heard. Pulmonary/Chest: Effort normal and breath sounds normal. No respiratory distress. He has no wheezes.  Abdominal: Soft. Bowel sounds are normal. He exhibits no distension. There is no tenderness.  Musculoskeletal: Normal range of motion. He exhibits no edema or tenderness.  Neurological: He is alert and oriented to person, place, and time.  Skin: Skin is warm and dry. No rash noted. No erythema.  Psychiatric: He has a normal mood and affect. His behavior is normal. Judgment and thought content normal.  Vitals reviewed.     BP 137/91 mmHg  Pulse 118  Temp(Src) 97.5 F (36.4 C) (Oral)  Ht 5\' 10"  (1.778 m)  Wt 144 lb 9.6 oz (65.59 kg)  BMI 20.75 kg/m2     Assessment & Plan:  1. Acute bronchitis, unspecified organism -- Take meds as prescribed - Use a cool mist humidifier  -Use saline nose sprays frequently -Saline irrigations of the nose can be very helpful if done frequently.  * 4X daily for 1 week*  * Use of a nettie pot can be helpful with this. Follow directions with this* -Force fluids -For any cough or congestion  Use plain Mucinex- regular strength or max strength is fine   * Children- consult with Pharmacist for dosing -For fever or aces or pains- take tylenol or ibuprofen appropriate for age and weight.  * for fevers greater than 101 orally you may  alternate ibuprofen and tylenol every  3 hours. -Throat lozenges if help - predniSONE (STERAPRED UNI-PAK 21 TAB) 10 MG (21) TBPK tablet; Take 1 tablet (10 mg total) by mouth daily. As directed x 6 days  Dispense: 21 tablet; Refill: 0  2. Loss of weight - predniSONE (STERAPRED UNI-PAK 21 TAB) 10 MG (21) TBPK tablet; Take 1 tablet (10 mg total) by mouth daily. As directed x 6 days  Dispense: 21 tablet; Refill: 0  3. Other fatigue -  predniSONE (STERAPRED UNI-PAK 21 TAB) 10 MG (21) TBPK tablet; Take 1 tablet (10 mg total) by mouth daily. As directed x 6 days  Dispense: 21 tablet; Refill: 0  Jannifer Rodney, FNP

## 2016-06-22 NOTE — Patient Instructions (Signed)

## 2016-08-13 ENCOUNTER — Other Ambulatory Visit: Payer: Self-pay | Admitting: Family

## 2016-08-13 DIAGNOSIS — R5383 Other fatigue: Secondary | ICD-10-CM

## 2016-08-13 DIAGNOSIS — R634 Abnormal weight loss: Secondary | ICD-10-CM

## 2016-08-13 DIAGNOSIS — J209 Acute bronchitis, unspecified: Secondary | ICD-10-CM

## 2016-11-14 ENCOUNTER — Other Ambulatory Visit: Payer: Self-pay | Admitting: Family Medicine

## 2016-11-28 ENCOUNTER — Ambulatory Visit (INDEPENDENT_AMBULATORY_CARE_PROVIDER_SITE_OTHER)
Admission: RE | Admit: 2016-11-28 | Discharge: 2016-11-28 | Disposition: A | Discharge status: Home / Self Care | Payer: Worker's Compensation | Source: Ambulatory Visit | Attending: Internal Medicine | Admitting: Internal Medicine

## 2016-11-28 ENCOUNTER — Other Ambulatory Visit (INDEPENDENT_AMBULATORY_CARE_PROVIDER_SITE_OTHER): Payer: Worker's Compensation

## 2016-11-28 ENCOUNTER — Encounter: Payer: Self-pay | Admitting: Internal Medicine

## 2016-11-28 ENCOUNTER — Ambulatory Visit (INDEPENDENT_AMBULATORY_CARE_PROVIDER_SITE_OTHER): Payer: Medicare Other | Admitting: Internal Medicine

## 2016-11-28 VITALS — BP 126/84 | HR 94 | Ht 70.5 in | Wt 156.8 lb

## 2016-11-28 DIAGNOSIS — R918 Other nonspecific abnormal finding of lung field: Secondary | ICD-10-CM

## 2016-11-28 DIAGNOSIS — J449 Chronic obstructive pulmonary disease, unspecified: Secondary | ICD-10-CM

## 2016-11-28 DIAGNOSIS — J441 Chronic obstructive pulmonary disease with (acute) exacerbation: Secondary | ICD-10-CM | POA: Insufficient documentation

## 2016-11-28 DIAGNOSIS — F17210 Nicotine dependence, cigarettes, uncomplicated: Secondary | ICD-10-CM | POA: Diagnosis not present

## 2016-11-28 LAB — CBC WITH DIFFERENTIAL/PLATELET
BASOS PCT: 0.3 % (ref 0.0–3.0)
Basophils Absolute: 0 10*3/uL (ref 0.0–0.1)
EOS ABS: 0.3 10*3/uL (ref 0.0–0.7)
Eosinophils Relative: 4.3 % (ref 0.0–5.0)
HEMATOCRIT: 43.1 % (ref 39.0–52.0)
HEMOGLOBIN: 14.4 g/dL (ref 13.0–17.0)
LYMPHS PCT: 24.4 % (ref 12.0–46.0)
Lymphs Abs: 1.7 10*3/uL (ref 0.7–4.0)
MCHC: 33.4 g/dL (ref 30.0–36.0)
MCV: 83.4 fl (ref 78.0–100.0)
Monocytes Absolute: 0.5 10*3/uL (ref 0.1–1.0)
Monocytes Relative: 7.2 % (ref 3.0–12.0)
Neutro Abs: 4.5 10*3/uL (ref 1.4–7.7)
Neutrophils Relative %: 63.8 % (ref 43.0–77.0)
Platelets: 225 10*3/uL (ref 150.0–400.0)
RBC: 5.17 Mil/uL (ref 4.22–5.81)
RDW: 14.8 % (ref 11.5–15.5)
WBC: 7.1 10*3/uL (ref 4.0–10.5)

## 2016-11-28 LAB — BASIC METABOLIC PANEL
BUN: 13 mg/dL (ref 6–23)
CHLORIDE: 101 meq/L (ref 96–112)
CO2: 29 meq/L (ref 19–32)
Calcium: 9.6 mg/dL (ref 8.4–10.5)
Creatinine, Ser: 1.25 mg/dL (ref 0.40–1.50)
GFR: 74.18 mL/min (ref >60.00)
GLUCOSE: 91 mg/dL (ref 70–99)
POTASSIUM: 3.7 meq/L (ref 3.5–5.1)
SODIUM: 139 meq/L (ref 135–145)

## 2016-11-28 LAB — SEDIMENTATION RATE: Sed Rate: 47 mm/hr — ABNORMAL HIGH (ref 0–20)

## 2016-11-28 NOTE — Progress Notes (Signed)
Subjective:     Patient ID: Zachary [name], male   DOB: [birth year],    MRN: [id number]  HPI   56 yobm active smoker exposed to asbestos in 80's working inside Games developer at [employer] with ? Dx of asbestos by Dr Zachary [name] with new R ant cp since May 2017 with dx of pna but still coughing with persistent changes on serial CT so referred to pulmonary clinic 11/28/2016 by Dr   Zachary [name]   11/28/2016 1st Zachary [name] Pulmonary office visit/ Zachary [name]   Chief Complaint  Patient presents with  . Pulmonary Consult    Referred by Dr. Geronimo [name]. He states he had PNA in May 2017 and he has had SOB and cough since then. His cough is prod with white, thick, foamy sputum. He has also had some wt loss unintentionally.   Pt very difficult historian as focuses on his imaging, not his symptoms, but records indicate an acute illness early May 2017 eval by [facility] 04/29/16 with bad sweats, cough productive of yellow mucus rx x 7 days levaquin and then later developed ant R CP but eventually this resolved but cough did not/ TB skins test neg per pt  Cough is worse but not more productive in am's and is clear mucus maybe a couple of tbsp 5-6 x per day vs 10/hour and much greater volume before abx Last abx of September Last cxr in Nov 2017  On BREO can't tell it helps    No obvious day to day or daytime variability or assoc  mucus plugs or hemoptysis or cp or chest tightness, subjective wheeze or overt sinus or hb symptoms. No unusual exp hx or h/o childhood pna/ asthma or knowledge of premature birth.  Sleeping ok without nocturnal  or early am exacerbation  of respiratory  c/o's or need for noct saba. Also denies any obvious fluctuation of symptoms with weather or environmental changes or other aggravating or alleviating factors except as outlined above   Current Medications, Allergies, Complete Past Medical History, Past Surgical History, Family History, and Social History were reviewed in [facility] record.  ROS  The following are not active complaints unless bolded sore throat, dysphagia, dental problems, itching, sneezing,  nasal congestion or excess/ purulent secretions, ear ache,   fever, chills, sweats, unintended wt loss now improving, classically pleuritic or exertional cp,  orthopnea pnd or leg swelling, presyncope, palpitations, abdominal pain, anorexia, nausea, vomiting, diarrhea  or change in bowel or bladder habits, change in stools or urine, dysuria,hematuria,  rash, arthralgias, visual complaints, headache, numbness, weakness or ataxia or problems with walking or coordination,  change in mood/affect or memory.          Review of Systems     Objective:   Physical Exam amb bm nad  Wt Readings from Last 3 Encounters:  11/28/16 156 lb 12.8 oz (71.1 kg)  06/22/16 144 lb 9.6 oz (65.6 kg)  05/21/16 141 lb 6.4 oz (64.1 kg)    Vital signs reviewed - Note on arrival 02 sats  94% on RA     HEENT: nl dentition, turbinates, and oropharynx. Nl external ear canals without cough reflex   NECK :  without JVD/Nodes/TM/ nl carotid upstrokes bilaterally   LUNGS: no acc muscle use,  Nl contour chest which is clear to A and P bilaterally without cough on insp or exp maneuvers   CV:  RRR  no s3 or murmur or increase in P2, nad no edema  ABD:  soft and nontender with nl inspiratory excursion in the supine position. No bruits or organomegaly appreciated, bowel sounds nl  MS:  Nl gait/ ext warm without deformities, calf tenderness, cyanosis or clubbing No obvious joint restrictions   SKIN: warm and dry without lesions    NEURO:  alert, approp, nl sensorium with  no motor or cerebellar deficits apparent.     CXR PA and Lateral:   11/28/2016 :    I personally reviewed images and agree with radiology impression as follows:    Right apical airspace disease and overlying pleural thickening. Differential considerations would include pneumonia, scarring  or Pancoast type tumor. Recommend further evaluation with chest CT with IV contrast.  COPD.  Probable fibrosis in the lung bases.   Labs 11/28/2016   WBC  7.1 with Eos 0.3/ ESR 47       Assessment:

## 2016-11-28 NOTE — Patient Instructions (Addendum)
Try off BREO   The key is to stop smoking completely before smoking completely stops you!   Please remember to go to the lab and x-ray department downstairs for your tests - we will call you with the results when they are available.     Come to outpatient registration at [facility] (behind the ER) at 715 am Wednesday  12/07/16 with nothing to eat or drink after midnight Tuesday .for Bronchoscopy

## 2016-11-29 DIAGNOSIS — F17210 Nicotine dependence, cigarettes, uncomplicated: Secondary | ICD-10-CM | POA: Insufficient documentation

## 2016-11-29 NOTE — Progress Notes (Signed)
Spoke with pt and notified of results per Dr. Wert. Pt verbalized understanding and denied any questions. 

## 2016-11-29 NOTE — Assessment & Plan Note (Addendum)
Spirometry 11/28/2016  FEV1 2.29 (75%)  Ratio 55  On BREO  He is convinced the breo doesn't help and at fev1 > 2 liters he's probably right as his clinical is most c/w  GOLD Group A     so reasonable to try off and work on smoking cessation as the primary treatment (see separate a/p)

## 2016-11-29 NOTE — Assessment & Plan Note (Signed)

## 2016-11-29 NOTE — Assessment & Plan Note (Addendum)
-   CT chest 07/12/16 Pos air bronchograms RUL dense infiltrate new from 08/28/14  - Ct  Chest 0?25/17 No significant change Onset clinically early May 2017 with severe sweats/ copious sputum and transient pleuritic cp better p levaquin -  CXR 11/28/2016 persistent AS dz R apex   The air bronchograms on outside CT are not c/w a post obst process and the clinical hx certainly supports cap so we may just be seeing organized pna here but reasonable to exclude granumatous dz, lung ca esp BAC and Pancoast tumor, all in DDX  Discussed in detail all the  indications, usual  risks and alternatives  relative to the benefits with patient/wife who agree  to proceed with bronchoscopy with biopsy on 12/07/16   Total time devoted to counseling  > 50 % of 60 office visit:  review case with pt/wife discussion of options/alternatives/ personally creating written customized instructions  in presence of pt  then going over those specific  Instructions directly with the pt including how to use all of the meds but in particular covering each new medication in detail and the difference between the maintenance/automatic meds and the prns using an action plan format for the latter.  Please see AVS from this visit for a full list of these instructions

## 2016-12-01 ENCOUNTER — Other Ambulatory Visit: Payer: Self-pay | Admitting: Family

## 2016-12-01 DIAGNOSIS — R062 Wheezing: Secondary | ICD-10-CM

## 2016-12-01 DIAGNOSIS — F17200 Nicotine dependence, unspecified, uncomplicated: Secondary | ICD-10-CM

## 2016-12-01 NOTE — Telephone Encounter (Signed)
I do not see on patients medication list. Please advise  

## 2016-12-06 ENCOUNTER — Encounter (HOSPITAL_COMMUNITY): Payer: Self-pay

## 2016-12-07 ENCOUNTER — Ambulatory Visit (HOSPITAL_COMMUNITY): Payer: Worker's Compensation

## 2016-12-07 ENCOUNTER — Encounter (HOSPITAL_COMMUNITY)
Admission: RE | Disposition: A | Discharge status: Home / Self Care | Payer: Self-pay | Source: Ambulatory Visit | Attending: Internal Medicine

## 2016-12-07 ENCOUNTER — Ambulatory Visit (HOSPITAL_COMMUNITY)
Admission: RE | Admit: 2016-12-07 | Discharge: 2016-12-07 | Disposition: A | Discharge status: Home / Self Care | Payer: Worker's Compensation | Source: Ambulatory Visit | Attending: Internal Medicine | Admitting: Internal Medicine

## 2016-12-07 DIAGNOSIS — Z8249 Family history of ischemic heart disease and other diseases of the circulatory system: Secondary | ICD-10-CM | POA: Diagnosis not present

## 2016-12-07 DIAGNOSIS — Z7951 Long term (current) use of inhaled steroids: Secondary | ICD-10-CM | POA: Insufficient documentation

## 2016-12-07 DIAGNOSIS — F17210 Nicotine dependence, cigarettes, uncomplicated: Secondary | ICD-10-CM | POA: Insufficient documentation

## 2016-12-07 DIAGNOSIS — Z791 Long term (current) use of non-steroidal anti-inflammatories (NSAID): Secondary | ICD-10-CM | POA: Diagnosis not present

## 2016-12-07 DIAGNOSIS — K210 Gastro-esophageal reflux disease with esophagitis: Secondary | ICD-10-CM | POA: Diagnosis not present

## 2016-12-07 DIAGNOSIS — J439 Emphysema, unspecified: Secondary | ICD-10-CM | POA: Diagnosis not present

## 2016-12-07 DIAGNOSIS — Z8701 Personal history of pneumonia (recurrent): Secondary | ICD-10-CM | POA: Diagnosis not present

## 2016-12-07 DIAGNOSIS — Z79899 Other long term (current) drug therapy: Secondary | ICD-10-CM | POA: Insufficient documentation

## 2016-12-07 DIAGNOSIS — Z9889 Other specified postprocedural states: Secondary | ICD-10-CM

## 2016-12-07 DIAGNOSIS — I10 Essential (primary) hypertension: Secondary | ICD-10-CM | POA: Insufficient documentation

## 2016-12-07 DIAGNOSIS — Z77090 Contact with and (suspected) exposure to asbestos: Secondary | ICD-10-CM | POA: Insufficient documentation

## 2016-12-07 DIAGNOSIS — R918 Other nonspecific abnormal finding of lung field: Secondary | ICD-10-CM | POA: Diagnosis present

## 2016-12-07 HISTORY — PX: VIDEO BRONCHOSCOPY: SHX5072

## 2016-12-07 SURGERY — BRONCHOSCOPY, WITH FLUOROSCOPY
Anesthesia: Moderate Sedation | Laterality: Bilateral

## 2016-12-07 MED ORDER — SODIUM CHLORIDE 0.9 % IV SOLN
INTRAVENOUS | Status: DC
  Administered 2016-12-07: 08:00:00 via INTRAVENOUS

## 2016-12-07 MED ORDER — MIDAZOLAM HCL 5 MG/ML IJ SOLN
INTRAMUSCULAR | Status: AC
  Filled 2016-12-07: qty 2

## 2016-12-07 MED ORDER — PHENYLEPHRINE HCL 0.25 % NA SOLN
NASAL | Status: DC | PRN
  Administered 2016-12-07: 2 via NASAL

## 2016-12-07 MED ORDER — LIDOCAINE HCL 2 % EX GEL
CUTANEOUS | Status: DC | PRN
  Administered 2016-12-07: 1

## 2016-12-07 MED ORDER — MIDAZOLAM HCL 5 MG/ML IJ SOLN: 1.0000 mg | Freq: Once | INTRAMUSCULAR | Status: DC

## 2016-12-07 MED ORDER — MIDAZOLAM HCL 10 MG/2ML IJ SOLN
INTRAMUSCULAR | Status: DC | PRN
  Administered 2016-12-07 (×2): 2.5 mg via INTRAVENOUS

## 2016-12-07 MED ORDER — PHENYLEPHRINE HCL 0.25 % NA SOLN: 1.0000 | Freq: Four times a day (QID) | NASAL | Status: DC | PRN

## 2016-12-07 MED ORDER — MEPERIDINE HCL 25 MG/ML IJ SOLN
INTRAMUSCULAR | Status: DC | PRN
  Administered 2016-12-07: 50 mg via INTRAVENOUS

## 2016-12-07 MED ORDER — MEPERIDINE HCL 100 MG/ML IJ SOLN
INTRAMUSCULAR | Status: AC
  Filled 2016-12-07: qty 2

## 2016-12-07 MED ORDER — MEPERIDINE HCL 100 MG/ML IJ SOLN: 100.0000 mg | Freq: Once | INTRAMUSCULAR | Status: DC

## 2016-12-07 MED ORDER — LIDOCAINE HCL 2 % EX GEL: 1.0000 "application " | Freq: Once | CUTANEOUS | Status: DC

## 2016-12-07 MED ORDER — LIDOCAINE HCL 1 % IJ SOLN
INTRAMUSCULAR | Status: DC | PRN
  Administered 2016-12-07: 6 mL via RESPIRATORY_TRACT

## 2016-12-07 NOTE — Discharge Instructions (Signed)
Flexible Bronchoscopy, Care After These instructions give you information on caring for yourself after your procedure. Your doctor may also give you more specific instructions. Call your doctor if you have any problems or questions after your procedure. Follow these instructions at home:  Do not eat or drink anything for 2 hours after your procedure. If you try to eat or drink before the medicine wears off, food or drink could go into your lungs. You could also burn yourself.  After 2 hours have passed and when you can cough and gag normally, you may eat soft food and drink liquids slowly.  The day after the test, you may eat your normal diet.  You may do your normal activities.  Keep all doctor visits. Get help right away if:  You get more and more short of breath.  You get light-headed.  You feel like you are going to pass out (faint).  You have chest pain.  You have new problems that worry you.  You cough up more than a little blood.  You cough up more blood than before. This information is not intended to replace advice given to you by your health care provider. Make sure you discuss any questions you have with your health care provider. Document Released: 10/02/2009 Document Revised: 05/12/2016 Document Reviewed: 08/09/2013 Elsevier Interactive Patient Education  2017 Elsevier Inc.  Do not eat or drink until after 1030 today 12/07/16

## 2016-12-07 NOTE — Progress Notes (Signed)
Video bronchoscopy performed Intervention bronchial washings Intervention bronchial biopsies Pt tolerated well  Haiden Clucas Quandre RRT  

## 2016-12-07 NOTE — Op Note (Addendum)
Bronchoscopy Procedure Note  Date of Operation: 12/07/2016   Pre-op Diagnosis: persistent infiltrate RUl  Post-op Diagnosis: sam  Surgeon: Sandrea Hughs  Anesthesia: Monitored Local Anesthesia with Sedation Time Started: 0814 Time Stopped:  0932  Operation: Video Flexible fiberoptic bronchoscopy, diagnostic   Findings: mp secretions RUL  Specimen: BAL rul/ Tbbx RUL  Estimated Blood Loss: min  Complications: none  Indications and History: See updated H and P same date. The risks, benefits, complications, treatment options and expected outcomes were discussed with the patient.  The possibilities of reaction to medication, pulmonary aspiration, perforation of a viscus, bleeding, failure to diagnose a condition and creating a complication requiring transfusion or operation were discussed with the patient who freely signed the consent.    Description of Procedure: The patient was re-examined in the bronchoscopy suite and the site of surgery properly noted/marked.  The patient was identified  and the procedure verified as Flexible Fiberoptic Bronchoscopy.  A Time Out was held and the above information confirmed.   After the induction of topical nasopharyngeal anesthesia, the patient was positioned  and the bronchoscope was passed through the R naris. The vocal cords were visualized and  1% buffered lidocaine 5 ml was topically placed onto the cords. The cords were nl. The scope was then passed into the trachea.  1% buffered lidocaine given topically. Airways inspected bilaterally to the subsegmental level with the following findings:  All airways nl to the subsegmental level except the RUL with MP secretions    Interventions:  BAL RUL Tbbx x 4 RUL by fluoro    Will be recovered in bronch room due to remote possibility of TB.  Attestation: I performed the procedure.  Sandrea Hughs, MD Pulmonary and Critical Care Medicine [facility] Cell [phone] After 5:30 PM or  weekends, call [phone]

## 2016-12-07 NOTE — H&P (Signed)
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 Dx of asbestos by Dr Pearlie Oyster with new R ant cp since May 2017 with dx of pna but still coughing with persistent changes on serial CT so referred to pulmonary clinic 11/28/2016 by Dr   Pearlie Oyster   11/28/2016 1st [facility] office visit/ Jameson Tormey       Chief Complaint  Patient presents with  . Pulmonary Consult    Referred by Dr. Geronimo Boot. He states he had PNA in May 2017 and he has had SOB and cough since then. His cough is prod with white, thick, foamy sputum. He has also had some wt loss unintentionally.   Pt very difficult historian as focuses on his imaging, not his symptoms, but records indicate an acute illness early May 2017 eval by [facility] 04/29/16 with bad sweats, cough productive of yellow mucus rx x 7 days levaquin and then later developed ant R CP but eventually this resolved but cough did not/ TB skins test neg per pt  Cough is worse but not more productive in am's and is clear mucus maybe a couple of tbsp 5-6 x per day vs 10/hour and much greater volume before abx Last abx of September Last cxr in Nov 2017  On BREO can't tell it helps    No obvious day to day or daytime variability or assoc  mucus plugs or hemoptysis or cp or chest tightness, subjective wheeze or overt sinus or hb symptoms. No unusual exp hx or h/o childhood pna/ asthma or knowledge of premature birth.  Sleeping ok without nocturnal  or early am exacerbation  of respiratory  c/o's or need for noct saba. Also denies any obvious fluctuation of symptoms with weather or environmental changes or other aggravating or alleviating factors except as outlined above   Current Medications, Allergies, Complete Past Medical History, Past Surgical History, Family History, and Social History were reviewed in [employer] record.  ROS  The following are not active complaints unless  bolded sore throat, dysphagia, dental problems, itching, sneezing,  nasal congestion or excess/ purulent secretions, ear ache,   fever, chills, sweats, unintended wt loss now improving, classically pleuritic or exertional cp,  orthopnea pnd or leg swelling, presyncope, palpitations, abdominal pain, anorexia, nausea, vomiting, diarrhea  or change in bowel or bladder habits, change in stools or urine, dysuria,hematuria,  rash, arthralgias, visual complaints, headache, numbness, weakness or ataxia or problems with walking or coordination,  change in mood/affect or memory.          Review of Systems     Objective:   Physical Exam amb bm nad     Wt Readings from Last 3 Encounters:  11/28/16 156 lb 12.8 oz (71.1 kg)  06/22/16 144 lb 9.6 oz (65.6 kg)  05/21/16 141 lb 6.4 oz (64.1 kg)    Vital signs reviewed - Note on arrival 02 sats  94% on RA     HEENT: nl dentition, turbinates, and oropharynx. Nl external ear canals without cough reflex   NECK :  without JVD/Nodes/TM/ nl carotid upstrokes bilaterally   LUNGS: no acc muscle use,  Nl contour chest which is clear to A and P bilaterally without cough on insp or exp maneuvers   CV:  RRR  no s3 or murmur or increase in P2, nad no edema   ABD:  soft and nontender with nl inspiratory excursion in the supine position.  No bruits or organomegaly appreciated, bowel sounds nl  MS:  Nl gait/ ext warm without deformities, calf tenderness, cyanosis or clubbing No obvious joint restrictions   SKIN: warm and dry without lesions    NEURO:  alert, approp, nl sensorium with  no motor or cerebellar deficits apparent.     CXR PA and Lateral:   11/28/2016 :    I personally reviewed images and agree with radiology impression as follows:    Right apical airspace disease and overlying pleural thickening. Differential considerations would include pneumonia, scarring or Pancoast type tumor. Recommend further evaluation with chest CT  with IV contrast.  COPD. Probable fibrosis in the lung bases.   Labs 11/28/2016   WBC  7.1 with Eos 0.3/ ESR 47       Assessment:              Assessment & Plan Note by Tanda Rockers, MD at 11/29/2016 5:48 AM   Author: Tanda Rockers, MD Author Type: Physician Filed: 11/29/2016 6:03 AM  Note Status: Bernell List: Cosign Not Required Encounter Date: 11/29/2016 5:48 AM  Problem: Pulmonary infiltrates  Editor: Tanda Rockers, MD (Physician)  Prior Versions: 1. Tanda Rockers, MD (Physician) at 11/29/2016 5:50 AM - Written    - CT chest 07/12/16 Pos air bronchograms RUL dense infiltrate new from 08/28/14  - Ct  Chest 0?25/17 No significant change Onset clinically early May 2017 with severe sweats/ copious sputum and transient pleuritic cp better p levaquin -  CXR 11/28/2016 persistent AS dz R apex   The air bronchograms on outside CT are not c/w a post obst process and the clinical hx certainly supports cap so we may just be seeing organized pna here but reasonable to exclude granumatous dz, lung ca esp BAC and Pancoast tumor, all in DDX  Discussed in detail all the  indications, usual  risks and alternatives  relative to the benefits with patient/wife who agree  to proceed with bronchoscopy with biopsy on 12/07/16   Total time devoted to counseling  > 50 % of 60 office visit:  review case with pt/wife discussion of options/alternatives/ personally creating written customized instructions  in presence of pt  then going over those specific  Instructions directly with the pt including how to use all of the meds but in particular covering each new medication in detail and the difference between the maintenance/automatic meds and the prns using an action plan format for the latter.  Please see AVS from this visit for a full list of these instructions     Assessment & Plan Note by Tanda Rockers, MD at 11/29/2016 5:59 AM   Author: Tanda Rockers, MD Author Type: Physician  Filed: 11/29/2016 5:59 AM  Note Status: Written Cosign: Cosign Not Required Encounter Date: 11/29/2016 5:59 AM  Problem: Cigarette smoker  Editor: Tanda Rockers, MD (Physician)    > 3 min discussion I reviewed the Fletcher curve with the patient that basically indicates  if you quit smoking when your best day FEV1 is still well preserved (as is clearly  the case here)  it is highly unlikely you will progress to severe disease and informed the patient there was  no medication on the market that has proven to alter the curve/ its downward trajectory  or the likelihood of progression of their disease(unlike other chronic medical conditions such as atheroclerosis where we do think we can change the natural hx with risk reducing meds)  Therefore stopping smoking and maintaining abstinence is the most important aspect of care, not choice of inhalers or for that matter, doctors.      Assessment & Plan Note by Tanda Rockers, MD at 11/29/2016 5:51 AM   Author: Tanda Rockers, MD Author Type: Physician Filed: 11/29/2016 5:58 AM  Note Status: Bernell List: Cosign Not Required Encounter Date: 11/29/2016 5:51 AM  Problem: COPD GOLD II  Editor: Tanda Rockers, MD (Physician)  Prior Versions: 1. Tanda Rockers, MD (Physician) at 11/29/2016 5:56 AM - Written    Spirometry 11/28/2016  FEV1 2.29 (75%)  Ratio 55  On BREO  He is convinced the breo doesn't help and at fev1 > 2 liters he's probably right as his clinical is most c/w  GOLD Group A     so reasonable to try off and work on smoking cessation as the primary treatment (see separate a/p)       Patient Instructions by Tanda Rockers, MD at 11/28/2016 9:30 AM   Author: Tanda Rockers, MD Author Type: Physician Filed: 11/28/2016 10:25 AM  Note Status: Addendum Cosign: Cosign Not Required Encounter Date: 11/28/2016 9:30 AM  Editor: Tanda Rockers, MD (Physician)  Prior Versions: 1. Tanda Rockers, MD (Physician) at 11/28/2016 10:22 AM -  Signed    Try off BREO   The key is to stop smoking completely before smoking completely stops you!   Please remember to go to the lab and x-ray department downstairs for your tests - we will call you with the results when they are available.     Come to outpatient registration at [facility] (behind the ER) at 715 am Wednesday  12/07/16 with nothing to eat or drink after midnight Tuesday .for Bronchoscopy    .12/07/2016 day of FOB no change in hx or exam    Christinia Gully, MD Pulmonary and [place] [phone] After 5:30 PM or weekends, call [phone]

## 2016-12-08 ENCOUNTER — Encounter (HOSPITAL_COMMUNITY): Payer: Self-pay | Admitting: Internal Medicine

## 2016-12-09 LAB — CULTURE, RESPIRATORY W GRAM STAIN: Culture: NORMAL

## 2016-12-09 LAB — CULTURE, RESPIRATORY: SPECIAL REQUESTS: NORMAL

## 2016-12-13 LAB — ACID FAST SMEAR (AFB): ACID FAST SMEAR - AFSCU2: NEGATIVE

## 2016-12-16 ENCOUNTER — Telehealth: Payer: Self-pay | Admitting: Internal Medicine

## 2016-12-16 DIAGNOSIS — R918 Other nonspecific abnormal finding of lung field: Secondary | ICD-10-CM

## 2016-12-16 NOTE — Telephone Encounter (Signed)
Called and spoke with pt and he is aware of this message being sent to [facility] for recs of results.  MW please advise. Thanks  Allergies  Allergen Reactions  . Aspirin Other (See Comments)    Stomach burning

## 2016-12-17 NOTE — Telephone Encounter (Signed)
The preliminaries are all neg but the finals take weeks to come back Set him up for f/u with cxr in 2 weeks to see me to regroup and go over what we have so far and where to go next

## 2016-12-20 NOTE — Telephone Encounter (Signed)
[id number] x1 

## 2016-12-20 NOTE — Telephone Encounter (Signed)
Called and spoke with pt and he is aware of results.  appt scheduled with MW on 1-16 and pt is aware to do the cxr prior to his appt.

## 2016-12-20 NOTE — Telephone Encounter (Signed)
Patient returning call - He can be reached at [phone] -pr

## 2017-01-03 ENCOUNTER — Ambulatory Visit (INDEPENDENT_AMBULATORY_CARE_PROVIDER_SITE_OTHER)
Admission: RE | Admit: 2017-01-03 | Discharge: 2017-01-03 | Disposition: A | Discharge status: Home / Self Care | Payer: Worker's Compensation | Source: Ambulatory Visit | Attending: Internal Medicine | Admitting: Internal Medicine

## 2017-01-03 ENCOUNTER — Other Ambulatory Visit: Payer: Self-pay | Admitting: Internal Medicine

## 2017-01-03 ENCOUNTER — Encounter: Payer: Self-pay | Admitting: Internal Medicine

## 2017-01-03 ENCOUNTER — Ambulatory Visit (INDEPENDENT_AMBULATORY_CARE_PROVIDER_SITE_OTHER): Payer: Worker's Compensation | Admitting: Internal Medicine

## 2017-01-03 VITALS — BP 150/90 | HR 103 | Ht 70.5 in | Wt 159.6 lb

## 2017-01-03 DIAGNOSIS — R918 Other nonspecific abnormal finding of lung field: Secondary | ICD-10-CM

## 2017-01-03 DIAGNOSIS — F17210 Nicotine dependence, cigarettes, uncomplicated: Secondary | ICD-10-CM

## 2017-01-03 DIAGNOSIS — J449 Chronic obstructive pulmonary disease, unspecified: Secondary | ICD-10-CM | POA: Diagnosis not present

## 2017-01-03 DIAGNOSIS — I10 Essential (primary) hypertension: Secondary | ICD-10-CM

## 2017-01-03 NOTE — Progress Notes (Signed)
Subjective:     Patient ID: Zachary [name], male   DOB: August 29, 1951,    MRN: [id number]    Brief patient profile:  23 yobm active smoker exposed to asbestos in 80's working inside Surveyor, minerals at [employer] with ? Dx of asbestos by Dr Valorie Roosevelt with new R ant cp since May 2017 with dx of pna but still coughing with persistent changes on serial CT so referred to pulmonary clinic 11/28/2016 by Dr   Valorie Roosevelt   History of Present Illness  11/28/2016 1st  Pulmonary office visit/ Zachary [name]   Chief Complaint  Patient presents with  . Pulmonary Consult    Referred by Dr. Maree Erie. He states he had PNA in May 2017 and he has had SOB and cough since then. His cough is prod with white, thick, foamy sputum. He has also had some wt loss unintentionally.   Pt very difficult historian as focuses on his imaging, not his symptoms, but records indicate an acute illness early May 2017 eval by [facility] 04/29/16 with bad sweats, cough productive of yellow mucus rx x 7 days levaquin and then later developed ant R CP but eventually this resolved but cough did not/ TB skins test neg per pt  Cough is worse but not more productive in am's and is clear mucus maybe a couple of tbsp 5-6 x per day vs 10/hour and much greater volume before abx Last abx of September Last cxr in Nov 2017  On BREO can't tell it helps  rec Try off BREO The key is to stop smoking completely before smoking completely stops you!  Please remember to go to the lab and x-ray department downstairs for your tests - we will call you with the results when they are available.     Come to outpatient registration at [facility] (behind the ER) at 715 am Wednesday  12/07/16 with nothing to eat or drink after midnight Tuesday for Bronchoscopy > nl airway / bal/tbbx some atypial      01/03/2017  f/u ov/Pecolia Marando re:  Pulmonary infiltrates / still smoking Chief Complaint  Patient presents with  . Follow-up    Pt is here for bronchoscopy  and chest xray results.    no cp/ no longer cough at all/ now off BREO daily and no change doe  = MMRC1 = can walk nl pace, flat grade, can't hurry or go uphills or steps s sob      No obvious day to day or daytime variability or assoc  mucus plugs or hemoptysis or cp or chest tightness, subjective wheeze or overt sinus or hb symptoms. No unusual exp hx or h/o childhood pna/ asthma or knowledge of premature birth.  Sleeping ok without nocturnal  or early am exacerbation  of respiratory  c/o's or need for noct saba. Also denies any obvious fluctuation of symptoms with weather or environmental changes or other aggravating or alleviating factors except as outlined above   Current Medications, Allergies, Complete Past Medical History, Past Surgical History, Family History, and Social History were reviewed in [employer] record.  ROS  The following are not active complaints unless bolded sore throat, dysphagia, dental problems, itching, sneezing,  nasal congestion or excess/ purulent secretions, ear ache,   fever, chills, sweats, unintended wt loss now improving, classically pleuritic or exertional cp,  orthopnea pnd or leg swelling, presyncope, palpitations, abdominal pain, anorexia, nausea, vomiting, diarrhea  or change in bowel or bladder habits, change in stools or  urine, dysuria,hematuria,  rash, arthralgias, visual complaints, headache, numbness, weakness or ataxia or problems with walking or coordination,  change in mood/affect or memory.               Objective:   Physical Exam amb bm nad  01/03/2017        159   11/28/16 156 lb 12.8 oz (71.1 kg)  06/22/16 144 lb 9.6 oz (65.6 kg)  05/21/16 141 lb 6.4 oz (64.1 kg)    Vital signs reviewed - Note on arrival 02 sats  98 % on RA and BP 150/90      HEENT: nl dentition, turbinates, and oropharynx. Nl external ear canals without cough reflex   NECK :  without JVD/Nodes/TM/ nl carotid upstrokes bilaterally   LUNGS:  no acc muscle use,  Nl contour chest which is clear to A and P bilaterally without cough on insp or exp maneuvers   CV:  RRR  no s3 or murmur or increase in P2, nad no edema   ABD:  soft and nontender with nl inspiratory excursion in the supine position. No bruits or organomegaly appreciated, bowel sounds nl  MS:  Nl gait/ ext warm without deformities, calf tenderness, cyanosis or clubbing No obvious joint restrictions   SKIN: warm and dry without lesions    NEURO:  alert, approp, nl sensorium with  no motor or cerebellar deficits apparent.      CXR PA and Lateral:   01/03/2017 :    I personally reviewed images and agree with radiology impression as follows:    COPD changes with chronic bibasilar interstitial disease.  Large area of irregular opacity and pleuroparenchymal thickening and scarring in the RIGHT upper lobe, question persistent infection, postinflammatory process or neoplasm ; CT chest with contrast recommended for further assessment       Assessment:

## 2017-01-03 NOTE — Patient Instructions (Addendum)
We will call you to arrange follow up once the radiologist has given me his impression of your cxr now vs the ones you've done before and decide then on whether you need a PET scan  In meantime, work hard on not smoking - it's the most important aspect of your care

## 2017-01-04 NOTE — Assessment & Plan Note (Signed)
Spirometry 11/28/2016  FEV1 2.29 (75%)  Ratio 55  On BREO> no change symptoms 01/03/2017 off breo    I reviewed the Fletcher curve with the patient that basically indicates  if you quit smoking when your best day FEV1 is still well preserved (as is clearly  the case here)  it is highly unlikely you will progress to severe disease and informed the patient there was  no medication on the market that has proven to alter the curve/ its downward trajectory  or the likelihood of progression of their disease(unlike other chronic medical conditions such as atheroclerosis where we do think we can change the natural hx with risk reducing meds)    Therefore stopping smoking and maintaining abstinence is the most important aspect of care, not choice of inhalers or for that matter, doctors.   No need for rx unless limiting symptoms or tendency to flares  Could tol RULobectomy easily

## 2017-01-04 NOTE — Assessment & Plan Note (Signed)
Onset clinically early May 2017 with severe sweats/ copious sputum and transient pleuritic cp better p levaquin - CT chest 07/12/16 Pos air bronchograms RUL dense infiltrate  new from 08/28/14  - Ct  Chest 0?25/17 No significant change  -  CXR 11/28/2016 persistent AS dz R apex with esr = 47 - FOB 12/07/2016  MP secretions from RUL > bal/tbbx >>> cyt neg/ bx focal atypia    At this point PET and consideration for RULobectomy best option   Discussed in detail all the  indications, usual  risks and alternatives  relative to the benefits with patient who agrees to proceed with conservative w/u as outlined

## 2017-01-04 NOTE — Assessment & Plan Note (Signed)
>   3 min Discussed the risks and costs (both direct and indirect)  of smoking relative to the benefits of quitting but patient unwilling to commit at this point to a specific quit date.       

## 2017-01-04 NOTE — Assessment & Plan Note (Signed)
Not optimally controlled on present regimen. I reviewed this with the patient and emphasized importance of follow-up with primary care/ in meantime take meds and avoid salt

## 2017-01-17 LAB — FUNGUS CULTURE WITH STAIN

## 2017-01-17 LAB — FUNGAL ORGANISM REFLEX

## 2017-01-17 LAB — FUNGUS CULTURE RESULT

## 2017-01-19 ENCOUNTER — Ambulatory Visit (HOSPITAL_COMMUNITY)
Admission: RE | Admit: 2017-01-19 | Discharge: 2017-01-19 | Disposition: A | Discharge status: Home / Self Care | Payer: Worker's Compensation | Source: Ambulatory Visit | Attending: Internal Medicine | Admitting: Internal Medicine

## 2017-01-19 DIAGNOSIS — J439 Emphysema, unspecified: Secondary | ICD-10-CM | POA: Diagnosis not present

## 2017-01-19 DIAGNOSIS — J984 Other disorders of lung: Secondary | ICD-10-CM | POA: Diagnosis not present

## 2017-01-19 DIAGNOSIS — R918 Other nonspecific abnormal finding of lung field: Secondary | ICD-10-CM | POA: Diagnosis not present

## 2017-01-19 LAB — GLUCOSE, CAPILLARY: GLUCOSE-CAPILLARY: 100 mg/dL — AB (ref 65–99)

## 2017-01-19 MED ORDER — FLUDEOXYGLUCOSE F - 18 (FDG) INJECTION
7.9000 | Freq: Once | INTRAVENOUS | Status: AC | PRN
  Administered 2017-01-19: 7.9 via INTRAVENOUS

## 2017-01-23 ENCOUNTER — Other Ambulatory Visit: Payer: Self-pay | Admitting: Internal Medicine

## 2017-01-23 DIAGNOSIS — R918 Other nonspecific abnormal finding of lung field: Secondary | ICD-10-CM

## 2017-01-23 NOTE — Progress Notes (Signed)
Referral was sent.

## 2017-01-24 ENCOUNTER — Telehealth: Payer: Self-pay | Admitting: Internal Medicine

## 2017-01-24 LAB — ACID FAST CULTURE WITH REFLEXED SENSITIVITIES (MYCOBACTERIA)

## 2017-01-24 LAB — ACID FAST CULTURE WITH REFLEXED SENSITIVITIES: ACID FAST CULTURE - AFSCU3: NEGATIVE

## 2017-01-24 NOTE — Telephone Encounter (Signed)
I spoke with pt.  He spoke with Dr Sherene Sires yesterday 01/23/17 and Dr Sherene Sires advised that we dont need to send updates to [facility] comp until pt sees the surgeon and a definite plan of care is made.  PT verbalized understanding and will await for appt with surgeon.  Nothing further needed at this time.

## 2017-01-30 ENCOUNTER — Other Ambulatory Visit: Payer: Self-pay | Admitting: Family Medicine

## 2017-01-30 ENCOUNTER — Encounter: Payer: Self-pay | Admitting: Thoracic Surgery (Cardiothoracic Vascular Surgery)

## 2017-01-30 ENCOUNTER — Institutional Professional Consult (permissible substitution) (INDEPENDENT_AMBULATORY_CARE_PROVIDER_SITE_OTHER): Payer: Worker's Compensation | Admitting: Thoracic Surgery (Cardiothoracic Vascular Surgery)

## 2017-01-30 ENCOUNTER — Other Ambulatory Visit: Payer: Self-pay | Admitting: *Deleted

## 2017-01-30 VITALS — BP 131/90 | Resp 16 | Ht 71.5 in | Wt 159.0 lb

## 2017-01-30 DIAGNOSIS — R918 Other nonspecific abnormal finding of lung field: Secondary | ICD-10-CM

## 2017-01-30 DIAGNOSIS — J181 Lobar pneumonia, unspecified organism: Secondary | ICD-10-CM

## 2017-01-30 DIAGNOSIS — J439 Emphysema, unspecified: Secondary | ICD-10-CM | POA: Insufficient documentation

## 2017-01-30 DIAGNOSIS — Z8709 Personal history of other diseases of the respiratory system: Secondary | ICD-10-CM | POA: Diagnosis not present

## 2017-01-30 NOTE — Progress Notes (Signed)
PCP is Mechele Claude, MD Referring Provider is Nyoka Cowden, MD  Chief Complaint  Patient presents with  . Referral    for pulmonary infiltrates, h/o asbestosis.Marland KitchenMarland KitchenPET 01/19/17...only SPIROMETY done 11/18/16    HPI: 66 year old man sent for consultation regarding a chronic right upper lobe infiltrate.  Mr. Kluth is a 66 year old gentleman with a history of hypertension and gastroesophageal reflux. He also has a long history of tobacco abuse. He says he currently is down to about 4 cigarettes a day. Back in May 2017 he developed a productive cough, fevers, diaphoresis and general malaise. He lost about 19 pounds He was treated for pneumonia with Levaquin and says he also received steroids. He says he was told the "lung was all filled up." He had symptomatic improvement, but still had a persistent cough with white mucus. He had a CT which showed some persistent changes in the right upper lobe. 2 months later the CT was repeated and there were still changes. (I do not have access to those CT scans)  He was referred to Dr. Sherene Sires. He had bronchoscopy on 12/07/2016 which showed no endobronchial lesions. Transbronchial biopsies and bronchoalveolar lavage were performed. AFB and fungal cultures were negative. Respiratory culture showed normal flora. BAL showed inflammation. Transbronchial biopsy showed inflammation and focal atypical cells.  A PET CT was done. It showed extensive right upper lobe pleural thickening and adjacent lung disease. Pleural disease was hypermetabolic. There were some hypermetabolic mediastinal lymph nodes which were felt to be reactive. He also had severe emphysema and pulmonary scarring.  Zubrod Score: At the time of surgery this patient's most appropriate activity status/level should be described as: []     0    Normal activity, no symptoms [x]     1    Restricted in physical strenuous activity but ambulatory, able to do out light work []     2    Ambulatory and capable of  self care, unable to do work activities, up and about >50 % of waking hours                              []     3    Only limited self care, in bed greater than 50% of waking hours []     4    Completely disabled, no self care, confined to bed or chair []     5    Moribund    Past Medical History:  Diagnosis Date  . GERD (gastroesophageal reflux disease)   . Hypertension     Past Surgical History:  Procedure Laterality Date  . VIDEO BRONCHOSCOPY Bilateral 12/07/2016   Procedure: VIDEO BRONCHOSCOPY WITH FLUORO;  Surgeon: Nyoka Cowden, MD;  Location: [facility];  Service: Cardiopulmonary;  Laterality: Bilateral;    Family History  Problem Relation Age of Onset  . Heart disease Father     Social History Social History  Substance Use Topics  . Smoking status: Current Some Day Smoker    Packs/day: 0.10    Types: Cigarettes    Start date: 01/27/1974  . Smokeless tobacco: Never Used  . Alcohol use No    Current Outpatient Prescriptions  Medication Sig Dispense Refill  . Cholecalciferol (VITAMIN D) 2000 units CAPS Take 2,000 Units by mouth 2 (two) times a week.    . Naproxen Sod-Diphenhydramine (ALEVE PM) 220-25 MG TABS Take 0.5 tablets by mouth at bedtime as needed (sleep).    . naproxen sodium (ANAPROX) 220  MG tablet Take 220 mg by mouth daily as needed (pain).    Marland Kitchen omeprazole (PRILOSEC) 40 MG capsule TAKE 1 CAPSULE (40 MG TOTAL) BY MOUTH DAILY. 90 capsule 0  . hydrochlorothiazide (MICROZIDE) 12.5 MG capsule TAKE 1 CAPSULE (12.5 MG TOTAL) BY MOUTH DAILY. 30 capsule 0   No current facility-administered medications for this visit.     Allergies  Allergen Reactions  . Aspirin Other (See Comments)    Stomach burning     Review of Systems  Constitutional: Positive for activity change, fatigue and unexpected weight change (lost 19 pounds in 5/17, regained 18). Negative for fever.  HENT: Positive for trouble swallowing. Negative for dental problem and voice change.   Eyes:  Negative for visual disturbance.  Respiratory: Positive for cough (white phlegm) and shortness of breath. Negative for chest tightness.   Cardiovascular: Negative for chest pain.  Gastrointestinal: Negative for abdominal pain and blood in stool.  Genitourinary: Negative for difficulty urinating and dysuria.  Musculoskeletal: Negative for arthralgias and joint swelling.  Neurological: Negative for seizures and syncope.  Hematological: Negative for adenopathy. Does not bruise/bleed easily.  All other systems reviewed and are negative.   BP 131/90 (BP Location: Left Arm, Patient Position: Bed low/side rails up, Cuff Size: Large)   Resp 16   Ht 5' 11.5" (1.816 m)   Wt 159 lb (72.1 kg)   SpO2 91% Comment: ON RA  BMI 21.87 kg/m  Physical Exam  Constitutional: He is oriented to person, place, and time. He appears well-developed and well-nourished. No distress.  HENT:  Head: Normocephalic and atraumatic.  Eyes: Conjunctivae and EOM are normal. Pupils are equal, round, and reactive to light. No scleral icterus.  Neck: Neck supple. No thyromegaly present.  Cardiovascular: Normal rate, regular rhythm, normal heart sounds and intact distal pulses.  Exam reveals no gallop and no friction rub.   No murmur heard. Pulmonary/Chest: Effort normal. No respiratory distress. He has no wheezes.  Diminished BS bilaterally  Abdominal: Soft. He exhibits no distension. There is no tenderness.  Musculoskeletal: He exhibits no edema.  Lymphadenopathy:    He has no cervical adenopathy.  Neurological: He is alert and oriented to person, place, and time. No cranial nerve deficit.  Motor grossly intact  Skin: Skin is warm and dry.  Vitals reviewed.    Diagnostic Tests: NUCLEAR MEDICINE PET SKULL BASE TO THIGH  TECHNIQUE: 7.9 mCi F-18 FDG was injected intravenously. Full-ring PET imaging was performed from the skull base to thigh after the radiotracer. CT data was obtained and used for attenuation  correction and anatomic localization.  FASTING BLOOD GLUCOSE:  Value: 99 mg/dl  COMPARISON:  Chest x-ray 01/03/2017  FINDINGS: NECK  No hypermetabolic lymph nodes in the neck. Hypermetabolism in the left thyroid lobe without discrete nodule on the CT scan. This is more likely inflammation.  CHEST  Marked right-sided pleural thickening and adjacent severe lung disease. I do not see a discrete mass but the pleural thickening is hypermetabolic with SUV max of 12.0. Could not exclude the possibility of pleural tumor. Biopsy is suggested. New line severe emphysematous changes and pulmonary scarring. No discrete pulmonary lesion or worrisome pulmonary nodule.  Small but hypermetabolic mediastinal lymph nodes. 6.5 mm right paratracheal node has an SUV max of 4.0. New line smaller precarinal lymph nodes or also mild hypermetabolic with SUV max of 4.0. 10 mm subcarinal node is also hypermetabolic with SUV max of 4.8.  ABDOMEN/PELVIS  No abnormal hypermetabolic activity within the liver, pancreas,  adrenal glands, or spleen. No hypermetabolic lymph nodes in the abdomen or pelvis.  SKELETON  No focal hypermetabolic activity to suggest skeletal metastasis.  IMPRESSION: 1. Extensive right upper lobe pleural thickening and adjacent severe lung disease. The pleural disease is hypermetabolic. This could reflect tumor or severe chronic inflammation. Biopsy may be necessary. 2. Mildly hypermetabolic right-sided mediastinal lymph nodes more likely reactive/inflammatory than metastatic disease/neoplasm. 3. Severe emphysema and pulmonary scarring changes. 4. No significant findings in abdomen/pelvis.   Electronically Signed   By: Rudie Meyer M.D.   On: 01/19/2017 14:28 I personally reviewed the PET CT images and concur with the findings noted above  Impression: 66 year old man with a history of tobacco abuse and exposure to asbestos who has chronic lung disease and  pleural thickening in his right apex following pneumonia back in May 2017. He had 2 CTs, which I don't have access to both the showed right upper lobe airspace disease and pleural thickening. Those findings were also present on his recent PET/CT. The PET did show some hypermetabolic activity in the area of the pleural thickening. There is no discrete mass. Also notable on the CT images of the PET is severe emphysematous changes throughout both lungs although his FEV1 was 2.29 (75% of predicted) on spirometry.  This is a difficult situation as I think surgery will be difficult and likely complicated. I strongly suspect the area of pleural thickening will be scarred into the chest wall and difficult to manage. Even obtaining a biopsy of the involved area may be extremely difficult. If this does turn out to be cancer, which I think is probably not the case, he would not be resectable for cure.  I discussed the case with Dr. Lowella Dandy from interventional radiology. He thinks he may be able to do a CT-guided needle biopsy in the apex. There will be a risk for pneumothorax and hemoptysis. If either of those occur he may end up needing surgery. I would favor that as an initial first step if possible.  Plan: Check formal PFTs with him without bronchodilators and with diffusion capacity.  CT-guided needle biopsy right apex  Return in 2 weeks to discuss results  Loreli Slot, MD [facility] [phone]

## 2017-02-03 ENCOUNTER — Other Ambulatory Visit: Payer: Self-pay | Admitting: Radiology

## 2017-02-06 ENCOUNTER — Ambulatory Visit (HOSPITAL_COMMUNITY)
Admission: RE | Admit: 2017-02-06 | Discharge: 2017-02-06 | Disposition: A | Discharge status: Home / Self Care | Payer: Worker's Compensation | Source: Ambulatory Visit | Attending: Thoracic Surgery (Cardiothoracic Vascular Surgery) | Admitting: Thoracic Surgery (Cardiothoracic Vascular Surgery)

## 2017-02-06 DIAGNOSIS — J439 Emphysema, unspecified: Secondary | ICD-10-CM | POA: Diagnosis not present

## 2017-02-06 DIAGNOSIS — I10 Essential (primary) hypertension: Secondary | ICD-10-CM | POA: Insufficient documentation

## 2017-02-06 DIAGNOSIS — J181 Lobar pneumonia, unspecified organism: Secondary | ICD-10-CM | POA: Diagnosis not present

## 2017-02-06 DIAGNOSIS — F17210 Nicotine dependence, cigarettes, uncomplicated: Secondary | ICD-10-CM | POA: Diagnosis not present

## 2017-02-06 DIAGNOSIS — R61 Generalized hyperhidrosis: Secondary | ICD-10-CM | POA: Insufficient documentation

## 2017-02-06 DIAGNOSIS — K219 Gastro-esophageal reflux disease without esophagitis: Secondary | ICD-10-CM | POA: Insufficient documentation

## 2017-02-06 DIAGNOSIS — R918 Other nonspecific abnormal finding of lung field: Secondary | ICD-10-CM | POA: Diagnosis present

## 2017-02-06 DIAGNOSIS — Z9889 Other specified postprocedural states: Secondary | ICD-10-CM | POA: Insufficient documentation

## 2017-02-06 DIAGNOSIS — J984 Other disorders of lung: Secondary | ICD-10-CM | POA: Insufficient documentation

## 2017-02-06 LAB — APTT: aPTT: 34 seconds (ref 24–36)

## 2017-02-06 LAB — CBC
HCT: 40.5 % (ref 39.0–52.0)
Hemoglobin: 13.4 g/dL (ref 13.0–17.0)
MCH: 27.6 pg (ref 26.0–34.0)
MCHC: 33.1 g/dL (ref 30.0–36.0)
MCV: 83.5 fL (ref 78.0–100.0)
PLATELETS: 204 10*3/uL (ref 150–400)
RBC: 4.85 MIL/uL (ref 4.22–5.81)
RDW: 13.7 % (ref 11.5–15.5)
WBC: 4.8 10*3/uL (ref 4.0–10.5)

## 2017-02-06 LAB — PROTIME-INR
INR: 1.18
PROTHROMBIN TIME: 15.1 s (ref 11.4–15.2)

## 2017-02-06 MED ORDER — HYDROCODONE-ACETAMINOPHEN 5-325 MG PO TABS
1.0000 | ORAL_TABLET | ORAL | Status: DC | PRN
  Filled 2017-02-06: qty 2

## 2017-02-06 MED ORDER — MIDAZOLAM HCL 2 MG/2ML IJ SOLN
INTRAMUSCULAR | Status: AC
  Filled 2017-02-06: qty 6

## 2017-02-06 MED ORDER — SODIUM CHLORIDE 0.9 % IV SOLN: INTRAVENOUS | Status: DC

## 2017-02-06 MED ORDER — LIDOCAINE HCL 1 % IJ SOLN
INTRAMUSCULAR | Status: AC
  Filled 2017-02-06: qty 20

## 2017-02-06 MED ORDER — FENTANYL CITRATE (PF) 100 MCG/2ML IJ SOLN
INTRAMUSCULAR | Status: AC
Start: 2017-02-06 — End: 2017-02-06
  Filled 2017-02-06: qty 4

## 2017-02-06 MED ORDER — FENTANYL CITRATE (PF) 100 MCG/2ML IJ SOLN
INTRAMUSCULAR | Status: AC | PRN
Start: 2017-02-06 — End: 2017-02-06
  Administered 2017-02-06: 50 ug via INTRAVENOUS
  Administered 2017-02-06 (×2): 25 ug via INTRAVENOUS

## 2017-02-06 MED ORDER — MIDAZOLAM HCL 2 MG/2ML IJ SOLN
INTRAMUSCULAR | Status: AC | PRN
  Administered 2017-02-06: 0.5 mg via INTRAVENOUS
  Administered 2017-02-06: 1 mg via INTRAVENOUS
  Administered 2017-02-06 (×2): 0.5 mg via INTRAVENOUS

## 2017-02-06 NOTE — Sedation Documentation (Signed)
Patient is resting comfortably. 

## 2017-02-06 NOTE — Procedures (Signed)
CT guided FNA of pleural thickening in right upper lung.  Total of 5 FNAs obtained.  No immediate complication.  Minimal blood loss.  Plan for CXR one hour after biopsy.

## 2017-02-06 NOTE — Sedation Documentation (Addendum)
Bandaid R upper chest intact  

## 2017-02-06 NOTE — H&P (Signed)
Chief Complaint: Lung Mass  Referring Physician(s): Hendrickson,Steven C  Supervising Physician: Richarda Overlie  Patient Status: [facility] - Out-pt  History of Present Illness: Zachary [name] is a 66 y.o. male with a history of hypertension and gastroesophageal reflux.   He also has a long history of tobacco abuse. He says he currently is down to about 4 cigarettes a day.   Back in May 2017 he developed a productive cough, fevers, diaphoresis and general malaise. He lost about 19 pounds.  He was treated for pneumonia with Levaquin and says he also received steroids.   He had symptomatic improvement, but still had a persistent cough with white mucus.   He had a CT which showed some persistent changes in the right upper lobe.   2 months later the CT was repeated and there were still changes.   He was referred to Dr. Sherene Sires. He had bronchoscopy on 12/07/2016 which showed no endobronchial lesions.   Transbronchial biopsies and bronchoalveolar lavage were performed. AFB and fungal cultures were negative. Respiratory culture showed normal flora. BAL showed inflammation. Transbronchial biopsy showed inflammation and focal atypical cells.  A PET CT was done. It showed extensive right upper lobe pleural thickening and adjacent lung disease. Pleural disease was hypermetabolic. There were some hypermetabolic mediastinal lymph nodes which were felt to be reactive. He also had severe emphysema and pulmonary scarring.  He is here today for image guided biopsy.  He feels ok today, no fever/chills. He is NPO.  He does not take blood thinners.    Past Medical History:  Diagnosis Date  . Emphysema of lung (HCC) 01/30/2017  . GERD (gastroesophageal reflux disease)   . Hypertension     Past Surgical History:  Procedure Laterality Date  . VIDEO BRONCHOSCOPY Bilateral 12/07/2016   Procedure: VIDEO BRONCHOSCOPY WITH FLUORO;  Surgeon: Nyoka Cowden, MD;  Location: [facility];  Service:  Cardiopulmonary;  Laterality: Bilateral;    Allergies: Aspirin  Medications: Prior to Admission medications   Medication Sig Start Date End Date Taking? Authorizing Provider  Cholecalciferol (VITAMIN D) 2000 units CAPS Take 2,000 Units by mouth 2 (two) times a week.   Yes Historical Provider, MD  hydrochlorothiazide (MICROZIDE) 12.5 MG capsule TAKE 1 CAPSULE (12.5 MG TOTAL) BY MOUTH DAILY. 01/30/17  Yes Mechele Claude, MD  Naproxen Sod-Diphenhydramine (ALEVE PM) 220-25 MG TABS Take 0.5 tablets by mouth at bedtime as needed (sleep).   Yes Historical Provider, MD  naproxen sodium (ANAPROX) 220 MG tablet Take 220 mg by mouth daily as needed (pain).   Yes Historical Provider, MD  omeprazole (PRILOSEC) 40 MG capsule TAKE 1 CAPSULE (40 MG TOTAL) BY MOUTH DAILY. 11/14/16  Yes Junie Spencer, FNP     Family History  Problem Relation Age of Onset  . Heart disease Father     Social History   Social History  . Marital status: Married    Spouse name: N/A  . Number of children: N/A  . Years of education: N/A   Social History Main Topics  . Smoking status: Current Some Day Smoker    Packs/day: 0.10    Types: Cigarettes    Start date: 01/27/1974  . Smokeless tobacco: Never Used  . Alcohol use No  . Drug use: No  . Sexual activity: Not on file   Other Topics Concern  . Not on file   Social History Narrative  . No narrative on file    Review of Systems: A 12 point ROS discussed  Review of Systems  Constitutional: Positive for activity change, fatigue and unexpected weight change.  HENT: Negative.   Respiratory: Positive for cough.   Cardiovascular: Negative.   Gastrointestinal: Negative.   Genitourinary: Negative.   Musculoskeletal: Negative.   Skin: Negative.   Neurological: Negative.   Hematological: Negative.   Psychiatric/Behavioral: Negative.     Vital Signs: BP 137/89   Pulse 84   Temp 98.3 F (36.8 C)   Resp 20   Ht 5\' 10"  (1.778 m)   Wt 160 lb (72.6 kg)   SpO2  97%   BMI 22.96 kg/m   Physical Exam  Constitutional: He is oriented to person, place, and time. He appears well-developed.  HENT:  Head: Normocephalic and atraumatic.  Eyes: EOM are normal.  Neck: Normal range of motion.  Cardiovascular: Normal rate, regular rhythm and normal heart sounds.   No murmur heard. Pulmonary/Chest: Effort normal. No respiratory distress.  Breath sounds diminished bilaterally  Abdominal: Soft. He exhibits no distension. There is no tenderness.  Musculoskeletal: Normal range of motion.  Neurological: He is alert and oriented to person, place, and time.  Skin: Skin is warm and dry.  Psychiatric: He has a normal mood and affect. His behavior is normal. Judgment and thought content normal.  Vitals reviewed.   Mallampati Score:  MD Evaluation Airway: WNL Heart: WNL Abdomen: WNL Chest/ Lungs: WNL ASA  Classification: 2 Mallampati/Airway Score: Two  Imaging: Nm Pet Image Initial (pi) Skull Base To Thigh  Result Date: 01/19/2017 CLINICAL DATA:  Initial treatment strategy for pulmonary nodules. EXAM: NUCLEAR MEDICINE PET SKULL BASE TO THIGH TECHNIQUE: 7.9 mCi F-18 FDG was injected intravenously. Full-ring PET imaging was performed from the skull base to thigh after the radiotracer. CT data was obtained and used for attenuation correction and anatomic localization. FASTING BLOOD GLUCOSE:  Value: 99 mg/dl COMPARISON:  Chest x-ray 01/03/2017 FINDINGS: NECK No hypermetabolic lymph nodes in the neck. Hypermetabolism in the left thyroid lobe without discrete nodule on the CT scan. This is more likely inflammation. CHEST Marked right-sided pleural thickening and adjacent severe lung disease. I do not see a discrete mass but the pleural thickening is hypermetabolic with SUV max of 12.0. Could not exclude the possibility of pleural tumor. Biopsy is suggested. New line severe emphysematous changes and pulmonary scarring. No discrete pulmonary lesion or worrisome pulmonary  nodule. Small but hypermetabolic mediastinal lymph nodes. 6.5 mm right paratracheal node has an SUV max of 4.0. New line smaller precarinal lymph nodes or also mild hypermetabolic with SUV max of 4.0. 10 mm subcarinal node is also hypermetabolic with SUV max of 4.8. ABDOMEN/PELVIS No abnormal hypermetabolic activity within the liver, pancreas, adrenal glands, or spleen. No hypermetabolic lymph nodes in the abdomen or pelvis. SKELETON No focal hypermetabolic activity to suggest skeletal metastasis. IMPRESSION: 1. Extensive right upper lobe pleural thickening and adjacent severe lung disease. The pleural disease is hypermetabolic. This could reflect tumor or severe chronic inflammation. Biopsy may be necessary. 2. Mildly hypermetabolic right-sided mediastinal lymph nodes more likely reactive/inflammatory than metastatic disease/neoplasm. 3. Severe emphysema and pulmonary scarring changes. 4. No significant findings in abdomen/pelvis. Electronically Signed   By: Rudie Meyer M.D.   On: 01/19/2017 14:28    Labs:  CBC:  Recent Labs  11/28/16 1104  WBC 7.1  HGB 14.4  HCT 43.1  PLT 225.0    COAGS: No results for input(s): INR, APTT in the last 8760 hours.  BMP:  Recent Labs  11/28/16 1104  NA 139  K 3.7  CL 101  CO2 29  GLUCOSE 91  BUN 13  CALCIUM 9.6  CREATININE 1.25    LIVER FUNCTION TESTS: No results for input(s): BILITOT, AST, ALT, ALKPHOS, PROT, ALBUMIN in the last 8760 hours.  TUMOR MARKERS: No results for input(s): AFPTM, CEA, CA199, CHROMGRNA in the last 8760 hours.  Assessment and Plan:  Extensive hypermetabolic right upper lobe pleural thickening  Will proceed with image guided biopsy today by Dr. Lowella Dandy.  Patient understands he is high risk given his lung disease and smoking status.  Risks and Benefits were discussed with the patient including, but not limited to bleeding, hemoptysis, respiratory failure requiring intubation, infection, pneumothorax requiring chest  tube placement, stroke from air embolism or even death.  All of the patient's questions were answered, patient is agreeable to proceed. Consent signed and in chart.  Thank you for this interesting consult.  I greatly enjoyed meeting Zachary [name] and look forward to participating in their care.  A copy of this report was sent to the requesting provider on this date.  Electronically Signed: Gwynneth Macleod PA-C 02/06/2017, 9:56 AM   I spent a total of  30 Minutes in face to face in clinical consultation, greater than 50% of which was counseling/coordinating care for lung biopsy

## 2017-02-06 NOTE — Sedation Documentation (Addendum)
Patient awake

## 2017-02-06 NOTE — Discharge Instructions (Signed)
°  Needle Biopsy of Lung, Care After °Refer to this sheet in the next few weeks. These instructions provide you with information on caring for yourself after your procedure. Your health care provider may also give you more specific instructions. Your treatment has been planned according to current medical practices, but problems sometimes occur. Call your health care provider if you have any problems or questions after your procedure. °WHAT TO EXPECT AFTER THE PROCEDURE °· A bandage will be applied over the area where the needle was inserted. You may be asked to apply pressure to the bandage for several minutes to ensure there is minimal bleeding. °· In most cases, you can leave when your needle biopsy procedure is completed. Do not drive yourself home. Someone else should take you home. °· If you received an IV sedative or general anesthetic, you will be taken to a comfortable place to relax while the medicine wears off. °· If you have upcoming travel scheduled, talk to your health care provider about when it is safe to travel by air after the procedure. °HOME CARE INSTRUCTIONS °· Expect to take it easy for the rest of the day. °· Protect the area where you received the needle biopsy by keeping the bandage in place for as long as instructed. °· You may feel some mild pain or discomfort in the area, but this should stop in a day or two. °· Take medicines only as directed by your health care provider. °SEEK MEDICAL CARE IF:  °· You have pain at the biopsy site that worsens or is not helped by medicine. °· You have swelling or drainage at the needle biopsy site. °· You have a fever. °SEEK IMMEDIATE MEDICAL CARE IF:  °· You have new or worsening shortness of breath. °· You have chest pain. °· You are coughing up blood. °· You have bleeding that does not stop with pressure or a bandage. °· You develop light-headedness or fainting. °This information is not intended to replace advice given to you by your health care  provider. Make sure you discuss any questions you have with your health care provider. °Document Released: 10/02/2007 Document Revised: 12/26/2014 Document Reviewed: 04/29/2013 °Elsevier Interactive Patient Education © 2017 [employer] ° °

## 2017-02-09 ENCOUNTER — Ambulatory Visit (HOSPITAL_COMMUNITY)
Admission: RE | Admit: 2017-02-09 | Discharge: 2017-02-09 | Disposition: A | Discharge status: Home / Self Care | Payer: Worker's Compensation | Source: Ambulatory Visit | Attending: Thoracic Surgery (Cardiothoracic Vascular Surgery) | Admitting: Thoracic Surgery (Cardiothoracic Vascular Surgery)

## 2017-02-09 DIAGNOSIS — R918 Other nonspecific abnormal finding of lung field: Secondary | ICD-10-CM | POA: Diagnosis not present

## 2017-02-09 LAB — PULMONARY FUNCTION TEST
DL/VA % PRED: 32 %
DL/VA: 1.49 ml/min/mmHg/L
DLCO COR % PRED: 21 %
DLCO COR: 7.21 ml/min/mmHg
DLCO UNC: 7.21 ml/min/mmHg
DLCO unc % pred: 21 %
FEF 25-75 POST: 1.14 L/s
FEF 25-75 PRE: 0.84 L/s
FEF2575-%CHANGE-POST: 35 %
FEF2575-%PRED-POST: 42 %
FEF2575-%PRED-PRE: 31 %
FEV1-%Change-Post: 10 %
FEV1-%PRED-POST: 76 %
FEV1-%Pred-Pre: 69 %
FEV1-PRE: 2.1 L
FEV1-Post: 2.33 L
FEV1FVC-%CHANGE-POST: 5 %
FEV1FVC-%PRED-PRE: 69 %
FEV6-%CHANGE-POST: 5 %
FEV6-%PRED-POST: 106 %
FEV6-%Pred-Pre: 100 %
FEV6-PRE: 3.84 L
FEV6-Post: 4.05 L
FEV6FVC-%CHANGE-POST: 0 %
FEV6FVC-%Pred-Post: 102 %
FEV6FVC-%Pred-Pre: 102 %
FVC-%Change-Post: 4 %
FVC-%Pred-Post: 103 %
FVC-%Pred-Pre: 98 %
FVC-Post: 4.09 L
FVC-Pre: 3.9 L
POST FEV1/FVC RATIO: 57 %
Post FEV6/FVC ratio: 99 %
Pre FEV1/FVC ratio: 54 %
Pre FEV6/FVC Ratio: 98 %
RV % PRED: 72 %
RV: 1.75 L
TLC % pred: 74 %
TLC: 5.32 L

## 2017-02-09 MED ORDER — ALBUTEROL SULFATE (2.5 MG/3ML) 0.083% IN NEBU
2.5000 mg | INHALATION_SOLUTION | Freq: Once | RESPIRATORY_TRACT | Status: AC
  Administered 2017-02-09: 2.5 mg via RESPIRATORY_TRACT

## 2017-02-12 LAB — AEROBIC/ANAEROBIC CULTURE W GRAM STAIN (SURGICAL/DEEP WOUND): Culture: NO GROWTH

## 2017-02-12 LAB — AEROBIC/ANAEROBIC CULTURE (SURGICAL/DEEP WOUND): GRAM STAIN: NONE SEEN

## 2017-02-14 ENCOUNTER — Encounter: Payer: Self-pay | Admitting: Thoracic Surgery (Cardiothoracic Vascular Surgery)

## 2017-02-14 ENCOUNTER — Ambulatory Visit (INDEPENDENT_AMBULATORY_CARE_PROVIDER_SITE_OTHER): Payer: Worker's Compensation | Admitting: Thoracic Surgery (Cardiothoracic Vascular Surgery)

## 2017-02-14 ENCOUNTER — Other Ambulatory Visit: Payer: Self-pay | Admitting: Family

## 2017-02-14 VITALS — BP 142/90 | HR 100 | Resp 16 | Ht 71.5 in | Wt 159.0 lb

## 2017-02-14 DIAGNOSIS — J439 Emphysema, unspecified: Secondary | ICD-10-CM

## 2017-02-14 DIAGNOSIS — Z8709 Personal history of other diseases of the respiratory system: Secondary | ICD-10-CM

## 2017-02-14 DIAGNOSIS — R918 Other nonspecific abnormal finding of lung field: Secondary | ICD-10-CM | POA: Diagnosis not present

## 2017-02-14 NOTE — Progress Notes (Signed)
[address]       [place] [address]             [phone]    HPI: Mr. Zachary [name] returns for follow-up after his recent biopsy.  Mr. Robicheau is a 66 year old man with past medical history of hypertension, reflux, and tobacco abuse. He is still smoking although less than a half a pack a day at this point. He developed a Dr. cough back in May 2017. He was treated for pneumonia. He improved symptomatically but had a persistent cough. A CT showed persistent changes in the right upper lobe, which were still present 2 months later. He was referred to Dr. Sherene Sires and had bronchoscopy which showed some atypical cells in the setting of inflammation. A PET CT showed pleural thickening and adjacent lung disease. Pleural disease was hypermetabolic. He did have severe diffuse emphysematous changes in both lungs on CT scan.  I saw him on February 12. I recommended a needle biopsy which was done on 02/06/2017. Biopsy showed atypical cells.  In the interim Mr. Brier says he feels like his breathing is better. His cough has become less frequent and less severe. He denies any wheezing.  Past Medical History:  Diagnosis Date  . Emphysema of lung (HCC) 01/30/2017  . GERD (gastroesophageal reflux disease)   . Hypertension     Current Outpatient Prescriptions  Medication Sig Dispense Refill  . Cholecalciferol (VITAMIN D) 2000 units CAPS Take 2,000 Units by mouth 2 (two) times a week.    . hydrochlorothiazide (MICROZIDE) 12.5 MG capsule TAKE 1 CAPSULE (12.5 MG TOTAL) BY MOUTH DAILY. 30 capsule 0  . Naproxen Sod-Diphenhydramine (ALEVE PM) 220-25 MG TABS Take 0.5 tablets by mouth at bedtime as needed (sleep).    . naproxen sodium (ANAPROX) 220 MG tablet Take 220 mg by mouth daily as needed (pain).    Marland Kitchen omeprazole (PRILOSEC) 40 MG capsule TAKE 1 CAPSULE (40 MG TOTAL) BY MOUTH DAILY. 90 capsule 0   No current facility-administered medications for this visit.     Physical Exam BP  (!) 142/90 (BP Location: Right Arm, Patient Position: Sitting, Cuff Size: Large)   Pulse 100   Resp 16   Ht 5' 11.5" (1.816 m)   Wt 159 lb (72.1 kg)   SpO2 90% Comment: ON RA  BMI 21.8 kg/m  66 year old man in no acute distress Lungs diminished breath sounds bilaterally, no wheezing  Diagnostic Tests: Diagnosis LUNG, FINE NEEDLE ASPIRATION, RUL (SPECIMEN 02/06/17): ATYPICAL CELLS PRESENT. Preliminary Diagnosis PREDOMINANTLY BLOOD SMALL CLUSTERS OF CRUSHED CELLS ARE PRESENT BUT ARE NON-DIAGNOSTIC (MJ) Marlena Clipper MD Pathologist, Electronic Signature  Pulmonary function testing FVC 3.90 (98%) FEV1 2.10 (69%) FEV1 2.33 (76%) DLCO 17.21 (21%)  Impression: 66 year old man with a history of tobacco abuse which is ongoing and COPD who had pneumonia back about 9 months ago. He's had persistent radiographic changes since then. Most recently he had a PET/CT which showed hypermetabolic activity in the setting of pleural thickening and consolidation of the right upper lobe. He's had bronchoscopic and needle biopsies both of which showed atypical cells but neither of which demonstrated definitive cancer. He would be an extremely high-risk surgical candidate due to his diffusion capacity of only 21% predicted.  I discussed the options of continued radiographic follow-up versus surgical biopsy with Mr. Avilla. Surgical biopsy would be high risk. He is not a candidate for surgical resection. He is not interested in having a surgical biopsy at this time.  I'll plan to see him back in 2 months with a repeat CT scan.  Plan: Return in 2 months with CT  I spent 15 minutes with Mr. Golob the majority of the time was in discussion of the results and planning. Loreli Slot, MD [facility] [phone]

## 2017-02-23 ENCOUNTER — Other Ambulatory Visit: Payer: Self-pay | Admitting: Thoracic Surgery (Cardiothoracic Vascular Surgery)

## 2017-02-23 DIAGNOSIS — R918 Other nonspecific abnormal finding of lung field: Secondary | ICD-10-CM

## 2017-03-06 IMAGING — DX DG CHEST 2V
2 series · 2 of 2 positions shown · non-contrast
Comparison: 12/07/2016

CLINICAL DATA: Follow pneumonia, hypertension, smoker, GERD

EXAM:
CHEST  2 VIEW

[chest pa]
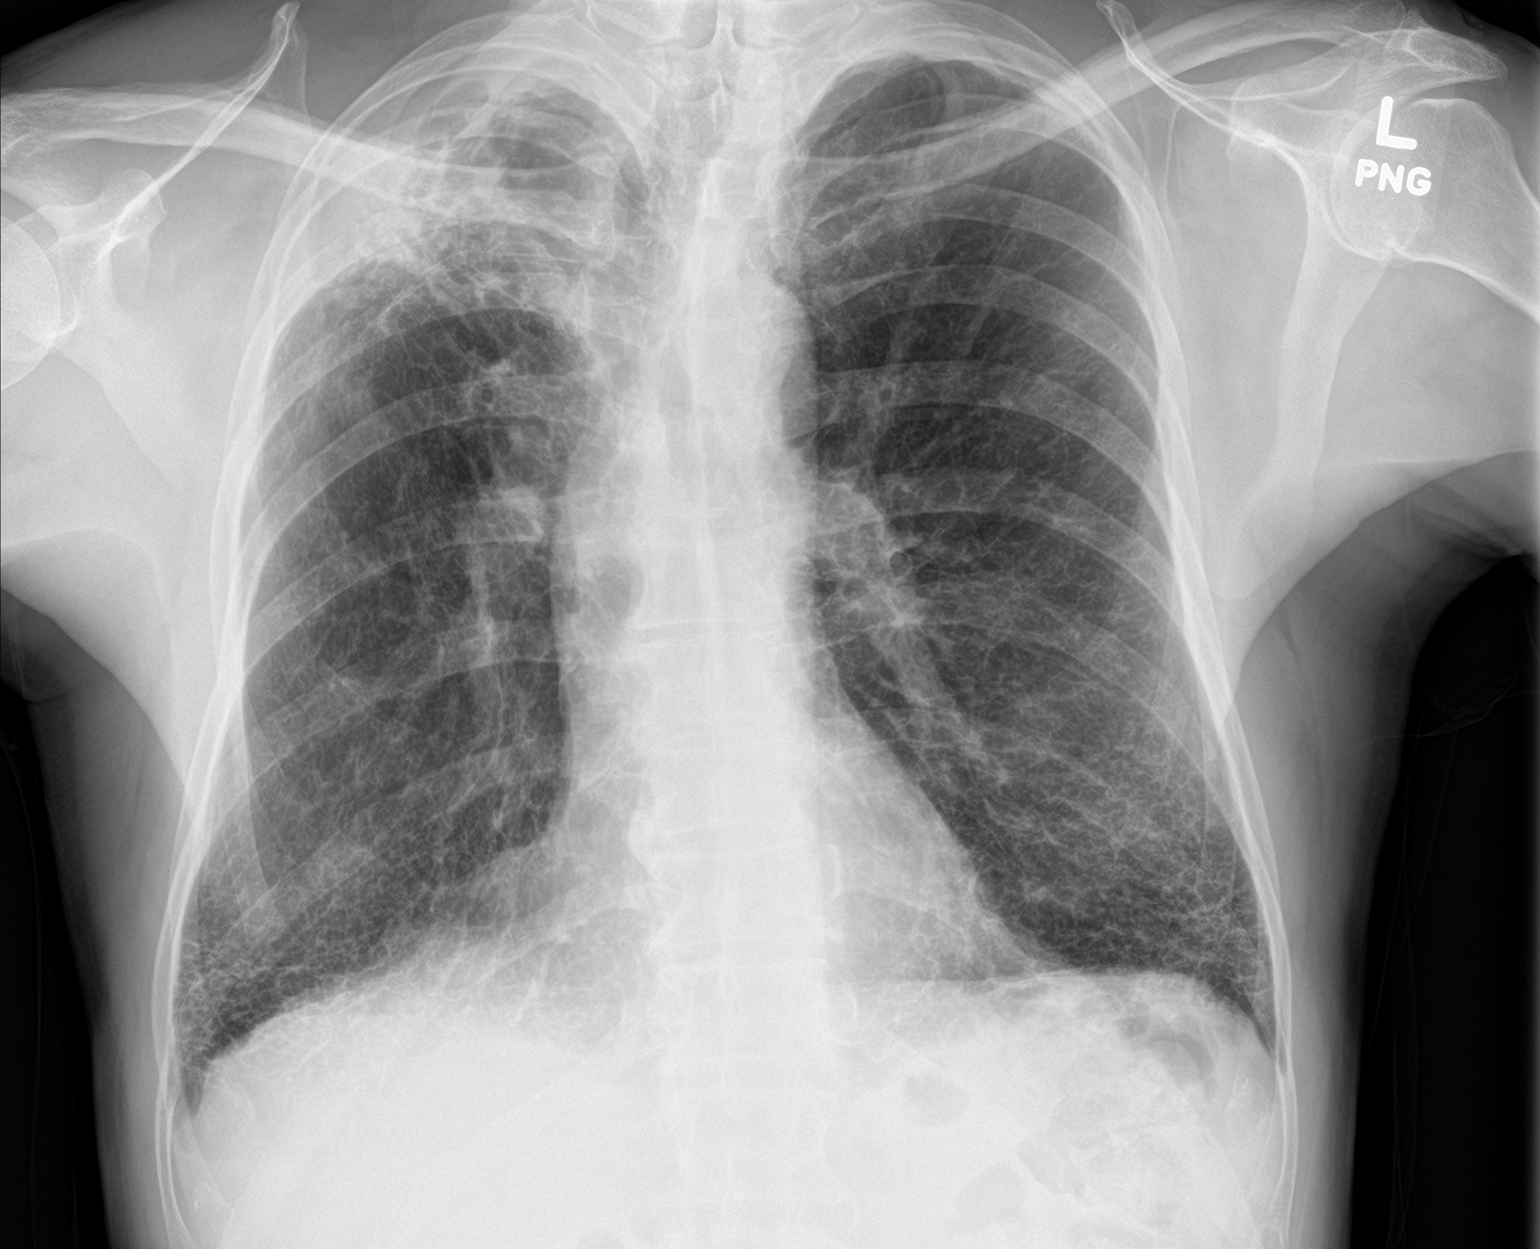

[chest lat]
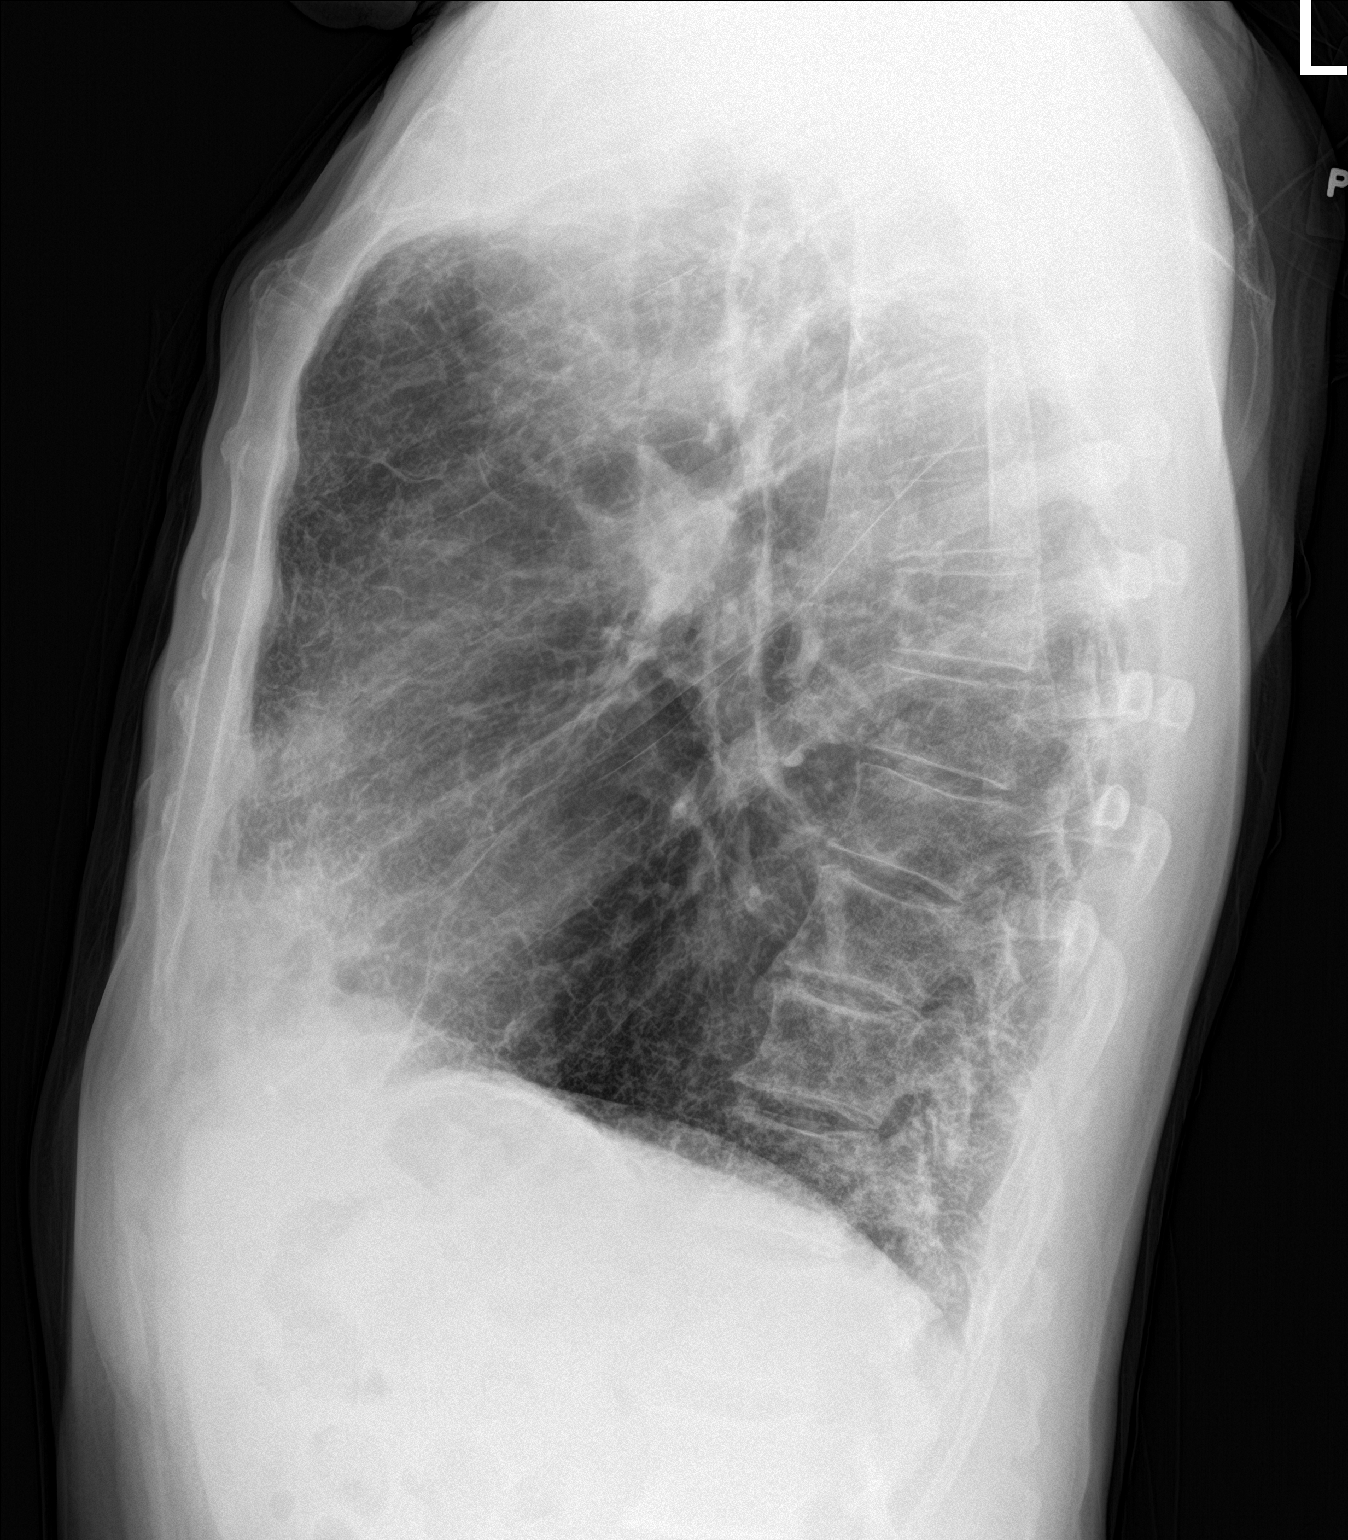

[2 of 2 positions shown; findings below may reference images not displayed]

FINDINGS: Normal heart size, mediastinal contours, and pulmonary vascularity.

Large irregular opacity identified at the lateral aspect of the
RIGHT upper hemithorax appears unchanged, consisting of RIGHT upper
lobe scarring and irregular pleural thickening.

This could represent persistent infection, postinflammatory changes
or neoplasm.

Underlying emphysematous and bronchitic changes consistent with
COPD.

Chronic basilar interstitial changes appear stable.

No new areas of consolidation, pleural effusion or pneumothorax.

Bones unremarkable.
IMPRESSION: COPD changes with chronic bibasilar interstitial disease.

Large area of irregular opacity and pleuroparenchymal thickening and
scarring in the RIGHT upper lobe, question persistent infection,
postinflammatory process or neoplasm ; CT chest with contrast
recommended for further assessment.

## 2017-04-09 IMAGING — CT CT BIOPSY
1 of 2 series · 12 of 29 positions shown, 15 images · non-contrast
Comparison: none

INDICATION: 65-year-old with pleural thickening and hypermetabolic activity in
the right upper lobe.

[Series 2: i-spiral 5.0 b40f · axial · 0.80mm/px · z∈[+1370,+1475]mm · 12 of 37 slices shown, 15 images]
[im 4/37  mediastinal]
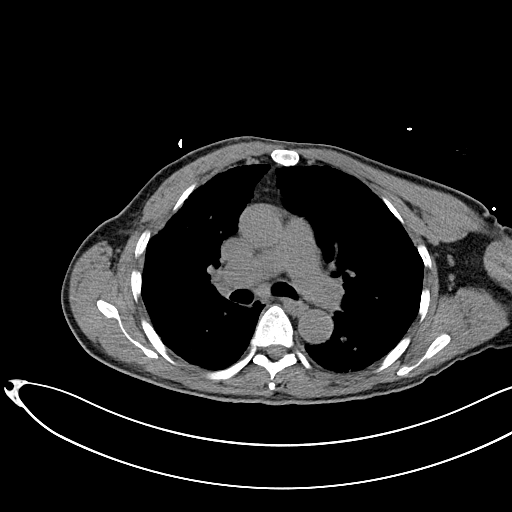
[im 4/37  lung]
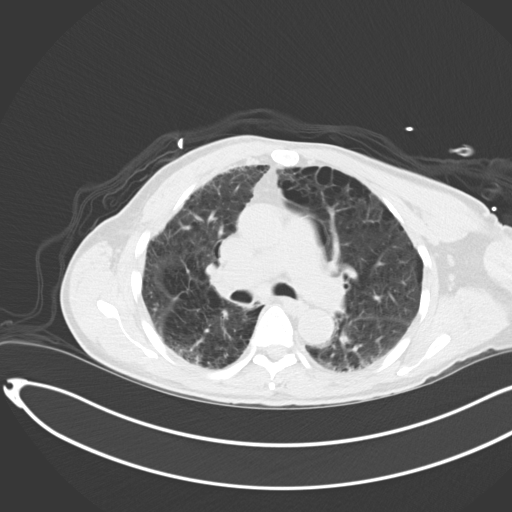
[im 7/37  lung]
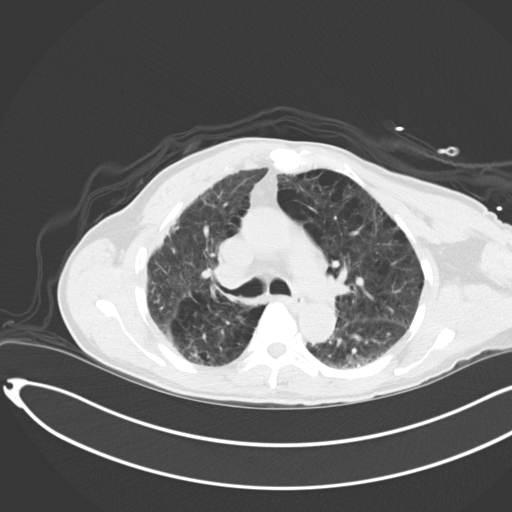
[im 10/37  lung]
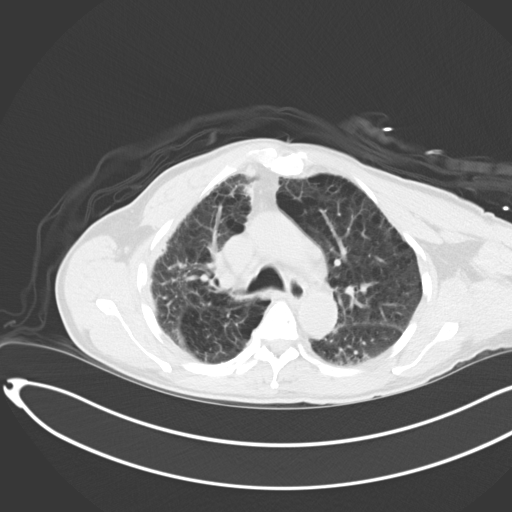
[im 13/37  lung]
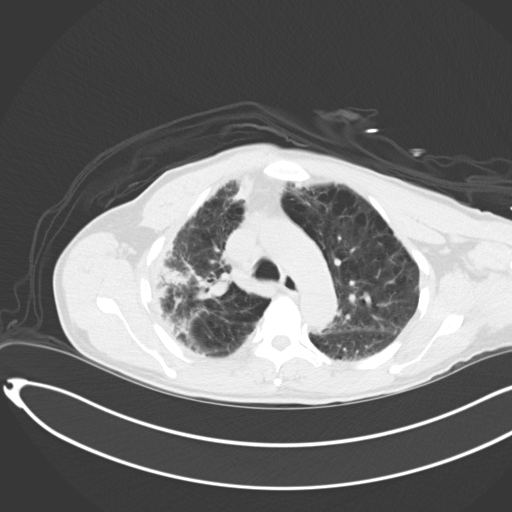
[im 16/37  mediastinal]
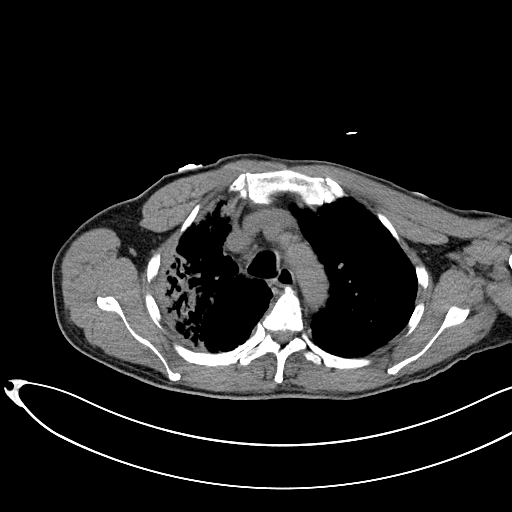
[im 16/37  lung]
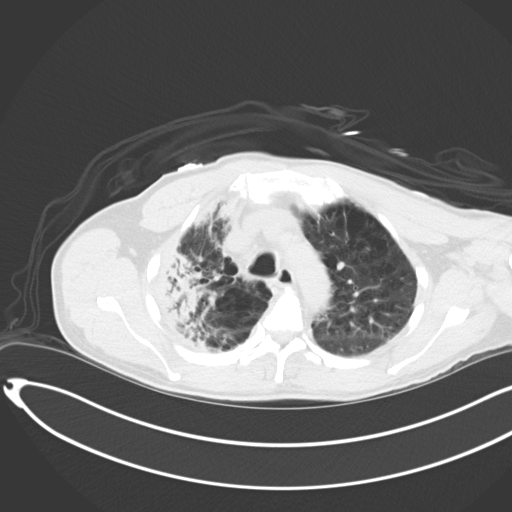
[im 17/37  lung]
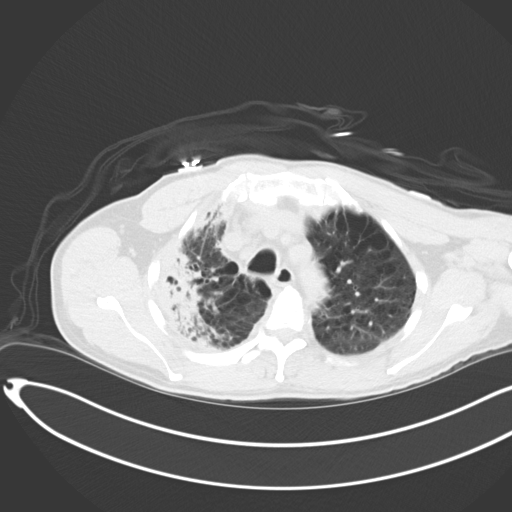
[im 19/37  lung]
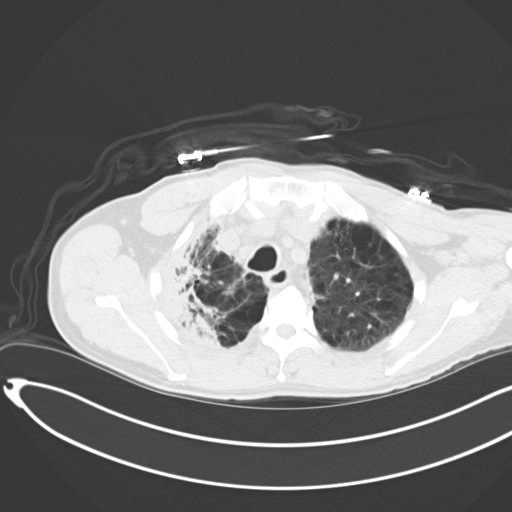
[im 22/37  lung]
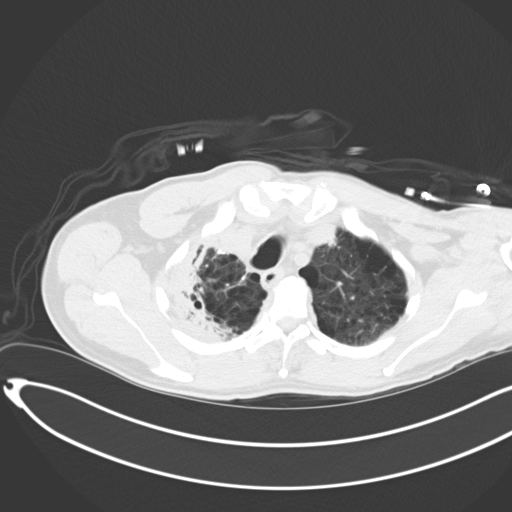
[im 25/37  mediastinal]
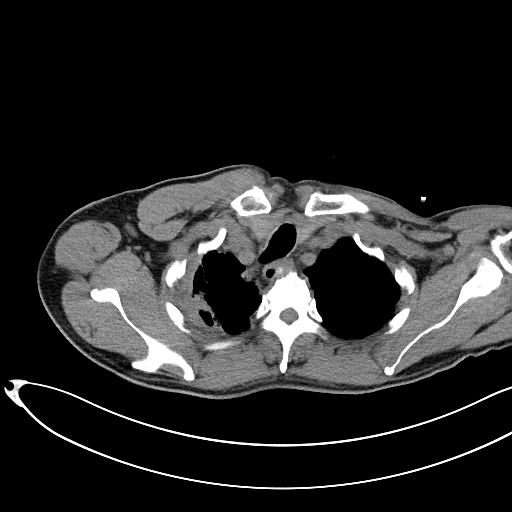
[im 25/37  lung]
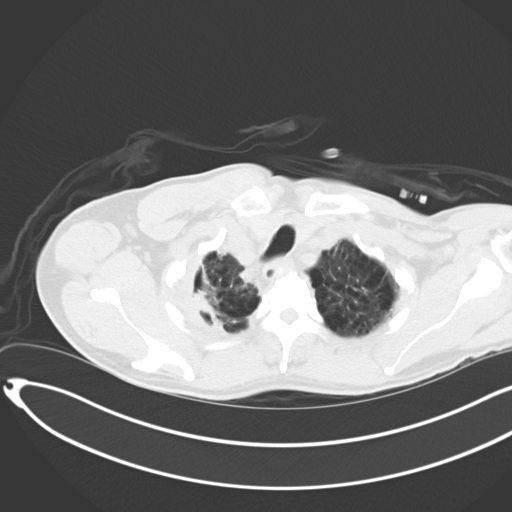
[im 28/37  lung]
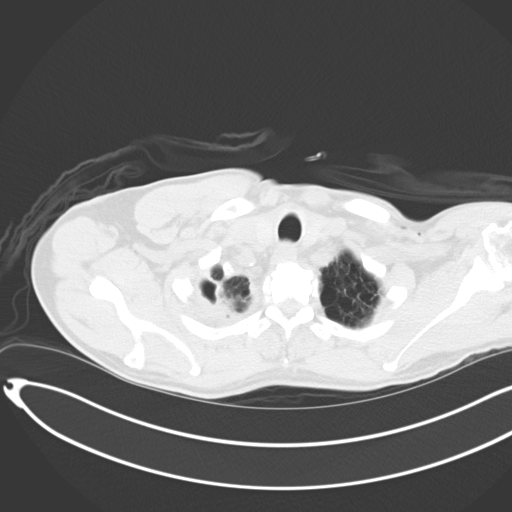
[im 31/37  lung]
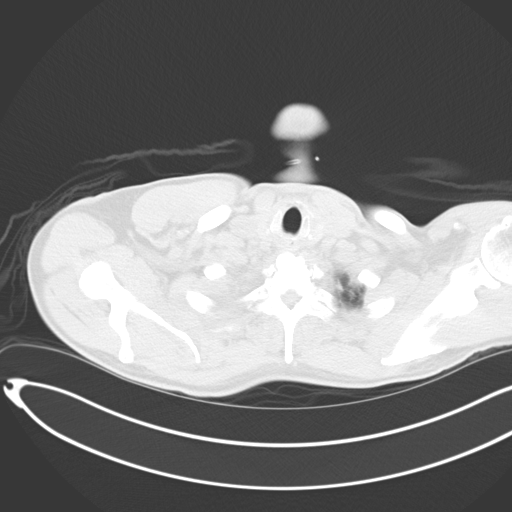
[im 34/37  lung]
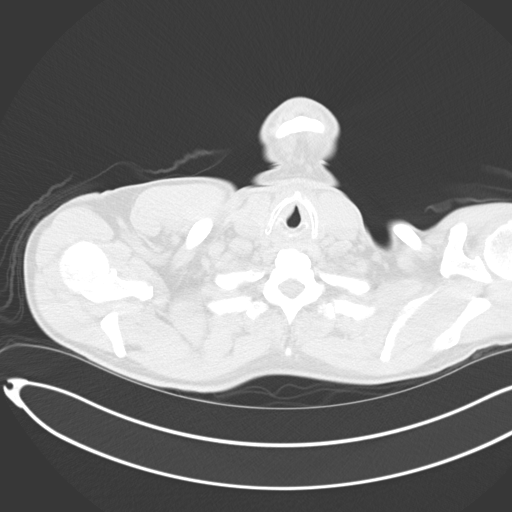

[12 of 29 positions shown; findings below may reference images not displayed]

EXAM:
CT-GUIDED BIOPSY OF RIGHT UPPER LOBE

MEDICATIONS:
None.

ANESTHESIA/SEDATION:
Moderate (conscious) sedation was employed during this procedure. A
total of Versed 2.5 mg and Fentanyl 100 mcg was administered
intravenously.

Moderate Sedation Time: 30 minutes. The patient's level of
consciousness and vital signs were monitored continuously by
radiology nursing throughout the procedure under my direct
supervision.

FLUOROSCOPY TIME:  None

COMPLICATIONS:
None immediate.

PROCEDURE:
Informed written consent was obtained from the patient after a
thorough discussion of the procedural risks, benefits and
alternatives. All questions were addressed. Maximal Sterile Barrier
Technique was utilized including caps, mask, sterile gowns, sterile
gloves, sterile drape, hand hygiene and skin antiseptic. A timeout
was performed prior to the initiation of the procedure.

Patient was placed supine on the CT scanner. Images through the
upper chest were obtained. Right upper chest was prepped with
chlorhexidine and sterile field was created. Pleural thickening
along the lateral right upper lung was targeted for biopsy. 17 gauge
needle was directed into the area of pleural thickening with CT
guidance. 5 fine-needle aspirations were obtained with 20 and 22
gauge needles. One FNA was submitted for culture. 17 gauge needle
was removed without complication. Bandage placed over the puncture
site.
FINDINGS: Again noted is severe pleural-parenchymal disease in the periphery
of the right upper lung. Biopsies obtained in the area of pleural
thickening.
IMPRESSION: CT-guided fine-needle aspirations of the pleural-parenchymal disease
in the right upper lung.

## 2017-04-11 ENCOUNTER — Encounter: Payer: Self-pay | Admitting: Thoracic Surgery (Cardiothoracic Vascular Surgery)

## 2017-04-11 ENCOUNTER — Ambulatory Visit (INDEPENDENT_AMBULATORY_CARE_PROVIDER_SITE_OTHER): Payer: Worker's Compensation | Admitting: Thoracic Surgery (Cardiothoracic Vascular Surgery)

## 2017-04-11 ENCOUNTER — Ambulatory Visit
Admission: RE | Admit: 2017-04-11 | Discharge: 2017-04-11 | Disposition: A | Discharge status: Home / Self Care | Payer: Worker's Compensation | Source: Ambulatory Visit | Attending: Thoracic Surgery (Cardiothoracic Vascular Surgery) | Admitting: Thoracic Surgery (Cardiothoracic Vascular Surgery)

## 2017-04-11 VITALS — BP 139/91 | HR 96 | Resp 16 | Ht 70.0 in | Wt 156.0 lb

## 2017-04-11 DIAGNOSIS — R918 Other nonspecific abnormal finding of lung field: Secondary | ICD-10-CM | POA: Diagnosis not present

## 2017-04-11 DIAGNOSIS — J439 Emphysema, unspecified: Secondary | ICD-10-CM | POA: Diagnosis not present

## 2017-04-11 DIAGNOSIS — Z8709 Personal history of other diseases of the respiratory system: Secondary | ICD-10-CM

## 2017-04-11 NOTE — Progress Notes (Signed)
[address]       [name] [address]             [phone]    HPI: Zachary [name] returns today for a scheduled follow-up visit.  He is a 66 year old man with a history of tobacco abuse and COPD. He also has a history of hypertension and gastroesophageal reflux. He developed a cough back in May 2017. He was treated for pneumonia. He improved symptomatically but his cough persisted. A CT showed persistent changes in the right upper lobe. Those changes were still present 2 months later. He was referred to Dr. Sherene Sires and had bronchoscopy. It showed some atypical cells in the setting of inflammation. A PET CT showed pleural thickening which was hypermetabolic.  I saw him in consultation and we did a needle biopsy of the area that was hypermetabolic on PET/CT. It showed atypical cells but no definite cancer.  He has severe emphysema and is not a good surgical candidate due to a diffusion capacity of only 21% predicted. We discussed doing a surgical biopsy that he preferred to follow the lesion radiographically.  He has been feeling well. He is not having any acute changes and shortness of breath. He did have small amounts of hemoptysis after his needle biopsy, but that has resolved. His appetite is fair, but unchanged. He denies any significant weight loss.  Past Medical History:  Diagnosis Date  . Emphysema of lung (HCC) 01/30/2017  . GERD (gastroesophageal reflux disease)   . Hypertension     Current Outpatient Prescriptions  Medication Sig Dispense Refill  . Cholecalciferol (VITAMIN D) 2000 units CAPS Take 2,000 Units by mouth 2 (two) times a week.    . hydrochlorothiazide (MICROZIDE) 12.5 MG capsule TAKE 1 CAPSULE (12.5 MG TOTAL) BY MOUTH DAILY. 30 capsule 0  . Naproxen Sod-Diphenhydramine (ALEVE PM) 220-25 MG TABS Take 0.5 tablets by mouth at bedtime as needed (sleep).    . naproxen sodium (ANAPROX) 220 MG tablet Take 220 mg by mouth daily as needed (pain).    Marland Kitchen  omeprazole (PRILOSEC) 40 MG capsule TAKE 1 CAPSULE (40 MG TOTAL) BY MOUTH DAILY. 90 capsule 0   No current facility-administered medications for this visit.     Physical Exam BP (!) 139/91 (BP Location: Left Arm, Patient Position: Sitting, Cuff Size: Large)   Pulse 96   Resp 16   Ht 5\' 10"  (1.778 m)   Wt 156 lb (70.8 kg)   SpO2 94% Comment: RA  BMI 22.54 kg/m  66 year old man in no acute distress Alert and oriented 3 with no focal deficits No cervical or supraclavicular adenopathy Lungs with diminished breath sounds bilaterally, no wheezing Cardiac regular rate and rhythm  Diagnostic Tests: CT CHEST WITHOUT CONTRAST  TECHNIQUE: Multidetector CT imaging of the chest was performed following the standard protocol without IV contrast.  COMPARISON:  01/19/2017 PET-CT.  02/06/2017 chest radiograph.  FINDINGS: Cardiovascular: Normal heart size. No significant pericardial fluid/thickening. Left anterior descending coronary atherosclerosis. Atherosclerotic nonaneurysmal thoracic aorta. Stable dilated main pulmonary artery (main pulmonary artery diameter 3.5 cm).  Mediastinum/Nodes: No discrete thyroid nodules. Unremarkable esophagus. No axillary adenopathy. Mildly enlarged 1.0 cm subcarinal node (series 3/ image 55), stable since 01/27/2017 PET-CT, where it was FDG avid. No additional pathologically enlarged mediastinal or gross hilar nodes on this noncontrast scan.  Lungs/Pleura: No pneumothorax. No pleural effusion. Severe centrilobular emphysema and diffuse bronchial wall thickening. There is a somewhat bandlike 8.5 x 2.4 cm pleural based  focus of consolidation in the peripheral right upper lobe (series 4/ image 27) with associated significant volume loss, distortion and bronchiectasis, unchanged in size and appearance since 01/19/2017 PET-CT and unchanged on chest radiographs back to 11/28/2016, although not present on 11/12/2012 chest radiograph. No  acute consolidative airspace disease or significant pulmonary nodules.  Upper abdomen: Partially visualized simple 1.6 cm inferior right liver lobe cyst.  Musculoskeletal: No aggressive appearing focal osseous lesions. Mild thoracic spondylosis.  IMPRESSION: 1. Bandlike peripheral right upper lobe consolidation with associated volume loss, distortion and bronchiectasis, stable since 01/19/2017 PET-CT and stable on radiographs since 11/28/2016, more suggestive of postinfectious/postinflammatory scarring than neoplasm. Continued chest CT surveillance recommended in 6 months. 2. Severe emphysema and diffuse bronchial wall thickening, suggesting COPD . 3. Stable nonspecific mild subcarinal lymphadenopathy. No new no thoracic adenopathy . 4. Aortic atherosclerosis.  One vessel coronary atherosclerosis. 5. Stable dilated main pulmonary artery, suggesting chronic pulmonary arterial hypertension.   Electronically Signed   By: Delbert Phenix M.D.   On: 04/11/2017 12:45 I personally reviewed the CT chest and concur with the findings noted above.  Impression: Zachary [name] is a 66 year old gentleman with a history of heavy tobacco abuse and COPD who has a persistent opacity in the right upper lobe. He hasn't been biopsied both bronchoscopically and CT-guided. Some atypical cells were seen with both biopsies but no definitive cancer cells. He is a poor surgical candidate so we've elected to follow this radiographically.  Since his last visit, he is feeling better. He still has a cough but is not as frequent or severe as it was previously. He has not had any significant weight loss.  The right upper lobe abnormality is occult to assess because of different techniques used for his scans between the CT and the CT-guided biopsy and the PET/CT. Overall I think is stable to slightly improved between his PET and today's CT. Again our options would be a surgical biopsy versus continued radiographic  follow-up. I do not see anything on today's scan to push for a biopsy at this point.  Plan: Return in 3 months with CT chest to follow-up right upper lobe opacity.  Loreli Slot, MD [facility] [phone]

## 2017-05-31 ENCOUNTER — Other Ambulatory Visit: Payer: Self-pay | Admitting: Thoracic Surgery (Cardiothoracic Vascular Surgery)

## 2017-05-31 DIAGNOSIS — R918 Other nonspecific abnormal finding of lung field: Secondary | ICD-10-CM

## 2017-06-27 ENCOUNTER — Ambulatory Visit (INDEPENDENT_AMBULATORY_CARE_PROVIDER_SITE_OTHER): Payer: Worker's Compensation | Admitting: Thoracic Surgery (Cardiothoracic Vascular Surgery)

## 2017-06-27 ENCOUNTER — Encounter: Payer: Self-pay | Admitting: Thoracic Surgery (Cardiothoracic Vascular Surgery)

## 2017-06-27 ENCOUNTER — Ambulatory Visit
Admission: RE | Admit: 2017-06-27 | Discharge: 2017-06-27 | Disposition: A | Discharge status: Home / Self Care | Payer: Worker's Compensation | Source: Ambulatory Visit | Attending: Thoracic Surgery (Cardiothoracic Vascular Surgery) | Admitting: Thoracic Surgery (Cardiothoracic Vascular Surgery)

## 2017-06-27 VITALS — BP 139/92 | HR 96 | Resp 20 | Ht 70.0 in | Wt 151.0 lb

## 2017-06-27 DIAGNOSIS — R918 Other nonspecific abnormal finding of lung field: Secondary | ICD-10-CM | POA: Diagnosis not present

## 2017-06-27 NOTE — Progress Notes (Signed)
[address]       [place] [address]             [phone]    HPI: Mr. Guadagnoli returns for a scheduled follow-up visit  Mr. Moody is a 66 year old gentleman with a history of tobacco abuse and severe COPD. He also has a history of reflux and hypertension. He developed a cough in May 2017 and was treated for pneumonia. His cough persisted and a CT showed persistent changes in his right upper lobe. Follow-up CT 2 months later showed no difference. Dr. work performed bronchoscopy which showed atypical cells in the setting the inflammation. A PET CT showed pleural thickening which was hypermetabolic. A CT-guided needle biopsy again showed atypical cells but no definite cancer. Of note the area had not worsened in the interval from the scans.Marland Kitchen  He is not a surgical candidate with a diffusing capacity of 21% predicted and severe bullous emphysema.  He complains of lack of energy and 5 pound weight loss over the past 3 months. His appetite is poor. He has not had any fevers or chills nor any exacerbations of his COPD.  He quit smoking in the interval since his last visit. He says he was keeping a few hidden from his wife and sneaking out to smoke them but has stopped that as well.  Past Medical History:  Diagnosis Date  . Emphysema of lung (HCC) 01/30/2017  . GERD (gastroesophageal reflux disease)   . Hypertension     Current Outpatient Prescriptions  Medication Sig Dispense Refill  . Cholecalciferol (VITAMIN D) 2000 units CAPS Take 2,000 Units by mouth 2 (two) times a week.    . hydrochlorothiazide (MICROZIDE) 12.5 MG capsule TAKE 1 CAPSULE (12.5 MG TOTAL) BY MOUTH DAILY. 30 capsule 0  . Naproxen Sod-Diphenhydramine (ALEVE PM) 220-25 MG TABS Take 0.5 tablets by mouth at bedtime as needed (sleep).    . naproxen sodium (ANAPROX) 220 MG tablet Take 220 mg by mouth daily as needed (pain).    Marland Kitchen omeprazole (PRILOSEC) 40 MG capsule TAKE 1 CAPSULE (40 MG TOTAL) BY  MOUTH DAILY. 90 capsule 0   No current facility-administered medications for this visit.     Physical Exam BP (!) 139/92 (BP Location: Left Arm, Cuff Size: Normal)   Pulse 96   Resp 20   Ht 5\' 10"  (1.778 m)   Wt 151 lb (68.5 kg)   SpO2 93% Comment: RA  BMI 21.73 kg/m  66 year old man in no acute distress Well-developed well-nourished No cervical or supraclavicular adenopathy Lungs with diminished breath sounds bilaterally, no wheezing Cardiac regular rate and rhythm  Diagnostic Tests: CT CHEST WITHOUT CONTRAST  TECHNIQUE: Multidetector CT imaging of the chest was performed following the standard protocol without IV contrast.  COMPARISON:  Chest CT dated 04/11/2017, a CT lung biopsy dated 02/06/2017 and PET-CT dated 01/19/2017.  FINDINGS: Cardiovascular: Heart size is normal. No pericardial effusion. No aortic aneurysm.  Mediastinum/Nodes: Scattered small lymph nodes within the mediastinum and perihilar regions, stable compared to previous exam and none pathologic by CT size criteria, but small hypermetabolic mediastinal lymph nodes were identified on the earlier PET-CT.  Lungs/Pleura: The right apical pleural based consolidation appears stable in size and extent, again with associated volume loss and bronchiectasis.  Also again noted is severe bilateral centrilobular emphysema. No new lung findings.  Upper Abdomen: No acute findings. Hypodense lesion incompletely imaged within the right liver lobe, described as a benign cyst on previous  studies.  Musculoskeletal: No acute or suspicious osseous finding. Mild degenerative spurring again noted within the upper thoracic spine.  IMPRESSION: 1. Stable pleural based consolidation within the right upper lobe, with associated volume loss and bronchiectasis, not appreciably changed compared to the previous chest CT of 04/11/2017 or PET-CT of 01/19/2017. Findings again most suggestive of  post infectious/postinflammatory scarring, with neoplastic process considered less likely. Recommend additional follow-up chest CT in 6 months or PET-CT to ensure 1 year stability compared to the PET-CT of 01/19/2017. 2. Scattered small lymph nodes within the mediastinum and perihilar regions, none of which are pathologic by CT size criteria, none of which appear to have enlarged compared to the previous exams, but several appeared mildly hypermetabolic on earlier PET-CT (PET-CT report describing these as more likely reactive/inflammatory than metastatic disease/neoplastic). 3. Severe emphysema. 4. No new findings.  Emphysema (ICD10-J43.9).   Electronically Signed   By: Bary Richard M.D.   On: 06/27/2017 13:16 I personally reviewed the CT chest compared to the CT from April. There's been no significant change.  Impression: Mr. Boudoin is a 66 year old gentleman with a history of tobacco abuse and COPD with a persistent opacity in the right apex. This developed in the setting of an acute respiratory infection. He's been biopsied twice and once bronchoscopically and once CT-guided. Both showed marked inflammation and also some atypical cells. This area has not changed over the past 6 months and likely is an area of chronic consolidation, i.e. BOOP. There still exist the possibility of underlying malignancy so we need to keep an eye on this area. I recommended that we repeat a CT in 6 months.  Tobacco abuse- quit in early June. Smoking cessation instruction/counseling given:  commended patient for quitting and reviewed strategies for preventing relapses   Plan: Return in 6 months with CT chest  Loreli Slot, MD [facility] [phone]

## 2017-07-11 ENCOUNTER — Ambulatory Visit: Payer: Worker's Compensation | Admitting: Thoracic Surgery (Cardiothoracic Vascular Surgery)

## 2017-07-19 ENCOUNTER — Other Ambulatory Visit: Payer: Self-pay | Admitting: Family Medicine

## 2017-07-19 ENCOUNTER — Ambulatory Visit (INDEPENDENT_AMBULATORY_CARE_PROVIDER_SITE_OTHER): Payer: Medicare Other | Admitting: Family Medicine

## 2017-07-19 ENCOUNTER — Encounter: Payer: Self-pay | Admitting: Family Medicine

## 2017-07-19 ENCOUNTER — Other Ambulatory Visit: Payer: Self-pay | Admitting: Family

## 2017-07-19 VITALS — BP 132/87 | HR 107 | Temp 97.1°F | Ht 69.0 in | Wt 147.0 lb

## 2017-07-19 DIAGNOSIS — R630 Anorexia: Secondary | ICD-10-CM

## 2017-07-19 DIAGNOSIS — S39012A Strain of muscle, fascia and tendon of lower back, initial encounter: Secondary | ICD-10-CM

## 2017-07-19 DIAGNOSIS — R634 Abnormal weight loss: Secondary | ICD-10-CM

## 2017-07-19 MED ORDER — MIRTAZAPINE 15 MG PO TABS: 15.0000 mg | ORAL_TABLET | Freq: Every day | ORAL | 1 refills | Status: DC

## 2017-07-19 MED ORDER — CYCLOBENZAPRINE HCL 10 MG PO TABS: 10.0000 mg | ORAL_TABLET | Freq: Three times a day (TID) | ORAL | 0 refills | Status: DC | PRN

## 2017-07-19 NOTE — Progress Notes (Signed)
BP 132/87   Pulse (!) 107   Temp (!) 97.1 F (36.2 C) (Oral)   Ht 5\' 9"  (1.753 m)   Wt 147 lb (66.7 kg)   BMI 21.71 kg/m    Subjective:    Patient ID: Zachary [name], male    DOB: Apr 02, 1951, 66 y.o.   MRN: [id number]  HPI: Zachary [name] is a 66 y.o. male presenting on 07/19/2017 for Hip Pain (right )   HPI Hip pain/low back pain Patient comes in today complaining of low back pain/back of his hip pain that is been bothering him over the past week. He has had this previously and used Flexeril and it improved it. He does not have any currently and was coming in to see if he could get some. He says the pain is on that right lower side and does not radiate anywhere else. He denies any numbness or weakness down his leg or any pain shooting anywhere else up his back.   Weight loss and decreased appetite Patient is also complaining of weight loss and decreased appetite that he has been struggling with since he has had pneumonia and infiltrates in his lung and just does not feel like eating as much. He denies any fevers or chills. He does say that he gets a little short of breath on exertion. He has seen a pulmonologist who is monitoring and managing his lung disease. He is just concerned because he is starting to lose weight and does not have that much weight to lose.  Relevant past medical, surgical, family and social history reviewed and updated as indicated. Interim medical history since our last visit reviewed. Allergies and medications reviewed and updated.  Review of Systems  Constitutional: Positive for appetite change and unexpected weight change. Negative for chills and fever.  Eyes: Negative for discharge.  Respiratory: Negative for shortness of breath and wheezing.   Cardiovascular: Negative for chest pain and leg swelling.  Musculoskeletal: Positive for arthralgias and back pain. Negative for gait problem.  Skin: Negative for rash.  All other systems reviewed and are  negative.   Per HPI unless specifically indicated above   Allergies as of 07/19/2017      Reactions   Aspirin Other (See Comments)   Stomach burning       Medication List       Accurate as of 07/19/17 11:47 AM. Always use your most recent med list.          ALEVE PM 220-25 MG Tabs Generic drug:  Naproxen Sod-Diphenhydramine Take 0.5 tablets by mouth at bedtime as needed (sleep).   cyclobenzaprine 10 MG tablet Commonly known as:  FLEXERIL Take 1 tablet (10 mg total) by mouth 3 (three) times daily as needed for muscle spasms.   hydrochlorothiazide 12.5 MG capsule Commonly known as:  MICROZIDE TAKE 1 CAPSULE (12.5 MG TOTAL) BY MOUTH DAILY.   mirtazapine 15 MG tablet Commonly known as:  REMERON Take 1 tablet (15 mg total) by mouth at bedtime.   omeprazole 40 MG capsule Commonly known as:  PRILOSEC TAKE 1 CAPSULE (40 MG TOTAL) BY MOUTH DAILY.   Vitamin D 2000 units Caps Take 2,000 Units by mouth 2 (two) times a week.          Objective:    BP 132/87   Pulse (!) 107   Temp (!) 97.1 F (36.2 C) (Oral)   Ht 5\' 9"  (1.753 m)   Wt 147 lb (66.7 kg)   BMI 21.71  kg/m   Wt Readings from Last 3 Encounters:  07/19/17 147 lb (66.7 kg)  06/27/17 151 lb (68.5 kg)  04/11/17 156 lb (70.8 kg)    Physical Exam  Constitutional: He is oriented to person, place, and time. He appears well-developed and well-nourished. No distress.  Eyes: Conjunctivae are normal. No scleral icterus.  Neck: Neck supple. No thyromegaly present.  Cardiovascular: Normal rate, regular rhythm, normal heart sounds and intact distal pulses.   No murmur heard. Pulmonary/Chest: Effort normal and breath sounds normal. No respiratory distress. He has no wheezes. He has no rales.  Abdominal: Soft. Bowel sounds are normal. He exhibits no distension. There is no tenderness. There is no rebound and no guarding.  Musculoskeletal: Normal range of motion. He exhibits no edema.       Lumbar back: He exhibits  tenderness. He exhibits normal range of motion, no swelling, no deformity and normal pulse.       Back:  Lymphadenopathy:    He has no cervical adenopathy.  Neurological: He is alert and oriented to person, place, and time. Coordination normal.  Skin: Skin is warm and dry. No rash noted. He is not diaphoretic.  Psychiatric: He has a normal mood and affect. His behavior is normal.  Nursing note and vitals reviewed.       Assessment & Plan:   Problem List Items Addressed This Visit    None    Visit Diagnoses    Lumbar strain, initial encounter    -  Primary   Patient has had previously, has used Flexeril and it worked great, will send flexor   Relevant Medications   cyclobenzaprine (FLEXERIL) 10 MG tablet   Weight loss       Relevant Medications   mirtazapine (REMERON) 15 MG tablet   Decreased appetite       Relevant Medications   mirtazapine (REMERON) 15 MG tablet       Follow up plan: Return in about 4 weeks (around 08/16/2017), or if symptoms worsen or fail to improve, for Follow-up in 4 weeks on weight and appetite and hypertension.  Counseling provided for all of the vaccine components No orders of the defined types were placed in this encounter.   Arville Care, MD  [facility] 07/19/2017, 11:47 AM

## 2017-08-16 ENCOUNTER — Ambulatory Visit (INDEPENDENT_AMBULATORY_CARE_PROVIDER_SITE_OTHER): Payer: Medicare Other | Admitting: Family Medicine

## 2017-08-16 ENCOUNTER — Encounter: Payer: Self-pay | Admitting: Family Medicine

## 2017-08-16 VITALS — BP 134/86 | HR 96 | Temp 98.0°F | Ht 69.0 in | Wt 147.0 lb

## 2017-08-16 DIAGNOSIS — R634 Abnormal weight loss: Secondary | ICD-10-CM | POA: Diagnosis not present

## 2017-08-16 DIAGNOSIS — Z1159 Encounter for screening for other viral diseases: Secondary | ICD-10-CM

## 2017-08-16 DIAGNOSIS — R630 Anorexia: Secondary | ICD-10-CM

## 2017-08-16 DIAGNOSIS — I10 Essential (primary) hypertension: Secondary | ICD-10-CM

## 2017-08-16 MED ORDER — DRONABINOL 2.5 MG PO CAPS: 2.5000 mg | ORAL_CAPSULE | Freq: Two times a day (BID) | ORAL | 5 refills | Status: DC

## 2017-08-16 NOTE — Progress Notes (Signed)
BP 134/86   Pulse 96   Temp 98 F (36.7 C) (Oral)   Ht '5\' 9"'$  (1.753 m)   Wt 147 lb (66.7 kg)   BMI 21.71 kg/m    Subjective:    Patient ID: Zachary [name], male    DOB: 02-17-1951, 66 y.o.   MRN: [id number]  HPI: Zachary [name] is a 66 y.o. male presenting on 08/16/2017 for Hypertension (4 wk followup) and Weight loss, decreased appetite (4 wk followup)   HPI Hypertension Patient is currently on HCl, and their blood pressure today is 134/86. Patient denies any lightheadedness or dizziness. Patient denies headaches, blurred vision, chest pains, shortness of breath, or weakness. Denies any side effects from medication and is content with current medication.   Weight loss and decreased appetite Patient is continuing to have issues with decreased appetite due to pulmonary issues and breathing issues because of the lesions in his lung. He tried the Remeron but said it made him too sleepy and he couldn't get up in the morning and would like to try something different if possible. He is also not to overly concerned about his weight and it's drop but is willing to try something if it will help keep him healthy.  Relevant past medical, surgical, family and social history reviewed and updated as indicated. Interim medical history since our last visit reviewed. Allergies and medications reviewed and updated.  Review of Systems  Constitutional: Positive for unexpected weight change. Negative for chills and fever.  Respiratory: Negative for shortness of breath and wheezing.   Cardiovascular: Negative for chest pain and leg swelling.  Musculoskeletal: Negative for back pain and gait problem.  Skin: Negative for rash.  Neurological: Negative for dizziness.  All other systems reviewed and are negative.   Per HPI unless specifically indicated above     Objective:    BP 134/86   Pulse 96   Temp 98 F (36.7 C) (Oral)   Ht '5\' 9"'$  (1.753 m)   Wt 147 lb (66.7 kg)   BMI 21.71 kg/m     Wt Readings from Last 3 Encounters:  08/16/17 147 lb (66.7 kg)  07/19/17 147 lb (66.7 kg)  06/27/17 151 lb (68.5 kg)    Physical Exam  Constitutional: He is oriented to person, place, and time. He appears well-developed and well-nourished. No distress.  Eyes: Conjunctivae are normal. No scleral icterus.  Neck: Neck supple. No thyromegaly present.  Cardiovascular: Normal rate, regular rhythm, normal heart sounds and intact distal pulses.   No murmur heard. Pulmonary/Chest: Effort normal and breath sounds normal. No respiratory distress. He has no wheezes. He has no rales.  Musculoskeletal: Normal range of motion. He exhibits no edema.  Lymphadenopathy:    He has no cervical adenopathy.  Neurological: He is alert and oriented to person, place, and time. Coordination normal.  Skin: Skin is warm and dry. No rash noted. He is not diaphoretic.  Psychiatric: He has a normal mood and affect. His behavior is normal.  Nursing note and vitals reviewed.     Assessment & Plan:   Problem List Items Addressed This Visit      Cardiovascular and Mediastinum   Essential hypertension, benign - Primary   Relevant Orders   CMP14+EGFR   Lipid panel    Other Visit Diagnoses    Weight loss       Relevant Orders   Lipid panel   Decreased appetite       Relevant Medications   dronabinol (  MARINOL) 2.5 MG capsule   Need for hepatitis C screening test       Relevant Orders   Hepatitis C antibody       Follow up plan: Return in about 6 months (around 02/15/2018), or if symptoms worsen or fail to improve.  Counseling provided for all of the vaccine components Orders Placed This Encounter  Procedures  . CMP14+EGFR  . Lipid panel  . Hepatitis C antibody    Caryl Pina, MD [facility] 08/16/2017, 1:36 PM

## 2017-08-17 LAB — LIPID PANEL
CHOL/HDL RATIO: 3.6 ratio (ref 0.0–5.0)
Cholesterol, Total: 143 mg/dL (ref 100–199)
HDL: 40 mg/dL (ref >39)
LDL Calculated: 88 mg/dL (ref 0–99)
Triglycerides: 75 mg/dL (ref 0–149)
VLDL CHOLESTEROL CAL: 15 mg/dL (ref 5–40)

## 2017-08-17 LAB — CMP14+EGFR
A/G RATIO: 0.9 — AB (ref 1.2–2.2)
ALBUMIN: 4 g/dL (ref 3.6–4.8)
ALK PHOS: 81 IU/L (ref 39–117)
ALT: 11 IU/L (ref 0–44)
AST: 18 IU/L (ref 0–40)
BILIRUBIN TOTAL: 0.4 mg/dL (ref 0.0–1.2)
BUN/Creatinine Ratio: 9 — ABNORMAL LOW (ref 10–24)
BUN: 10 mg/dL (ref 8–27)
CO2: 23 mmol/L (ref 20–29)
Calcium: 9.5 mg/dL (ref 8.6–10.2)
Chloride: 100 mmol/L (ref 96–106)
Creatinine, Ser: 1.16 mg/dL (ref 0.76–1.27)
GFR calc non Af Amer: 65 mL/min/{1.73_m2} (ref >59)
GFR, EST AFRICAN AMERICAN: 75 mL/min/{1.73_m2} (ref >59)
Globulin, Total: 4.5 g/dL (ref 1.5–4.5)
Glucose: 77 mg/dL (ref 65–99)
Potassium: 4.5 mmol/L (ref 3.5–5.2)
Sodium: 140 mmol/L (ref 134–144)
TOTAL PROTEIN: 8.5 g/dL (ref 6.0–8.5)

## 2017-08-17 LAB — HEPATITIS C ANTIBODY

## 2017-11-03 ENCOUNTER — Encounter: Payer: Self-pay | Admitting: Family Medicine

## 2017-11-03 ENCOUNTER — Ambulatory Visit (INDEPENDENT_AMBULATORY_CARE_PROVIDER_SITE_OTHER): Payer: Medicare Other | Admitting: Family Medicine

## 2017-11-03 VITALS — BP 137/83 | HR 102 | Temp 97.8°F | Ht 69.0 in | Wt 143.0 lb

## 2017-11-03 DIAGNOSIS — M7581 Other shoulder lesions, right shoulder: Secondary | ICD-10-CM | POA: Diagnosis not present

## 2017-11-03 DIAGNOSIS — M25532 Pain in left wrist: Secondary | ICD-10-CM

## 2017-11-03 DIAGNOSIS — M25531 Pain in right wrist: Secondary | ICD-10-CM

## 2017-11-03 MED ORDER — PREDNISONE 20 MG PO TABS: ORAL_TABLET | ORAL | 0 refills | Status: DC

## 2017-11-03 MED ORDER — OMEPRAZOLE 40 MG PO CPDR: DELAYED_RELEASE_CAPSULE | ORAL | 1 refills | Status: AC

## 2017-11-03 MED ORDER — HYDROCHLOROTHIAZIDE 12.5 MG PO CAPS: 12.5000 mg | ORAL_CAPSULE | Freq: Every day | ORAL | 1 refills | Status: DC

## 2017-11-03 NOTE — Progress Notes (Signed)
BP 137/83   Pulse (!) 102   Temp 97.8 F (36.6 C) (Oral)   Ht 5\' 9"  (1.753 m)   Wt 143 lb (64.9 kg)   BMI 21.12 kg/m    Subjective:    Patient ID: Zachary [name], male    DOB: 08/27/1951, 66 y.o.   MRN: [id number]  HPI: Zachary [name] is a 67 y.o. male presenting on 11/03/2017 for Right shoulder pain (moved two months ago and has been doing a lot around the house) and Bilateral wrist pain (some numbness in fingertips, swelling in fingers)   HPI Shoulder pain and wrist pain Patient comes in complaining of shoulder pain in his right shoulder and wrist pain in both of his wrists that has been irritating and bothering him more over the past 2 months since he has been doing a lot around the house.  He also feels like he gets some swelling and occasionally some tingling in his hands and numbness in his fingertips associated with his wrist pain.  The shoulder definitely hurts more with overhead range of motion and the overall in general just feels like it is more irritated.  He denies any fevers or chills or redness or warmth.  He denies any loss of strength or range of motion but just has the pain.  He rates the pain is mild to moderate and worse with motion although it does hurt his shoulder when he rolls over on it and lays on it during the nighttime.  Relevant past medical, surgical, family and social history reviewed and updated as indicated. Interim medical history since our last visit reviewed. Allergies and medications reviewed and updated.  Review of Systems  Constitutional: Negative for chills and fever.  Respiratory: Negative for shortness of breath and wheezing.   Cardiovascular: Negative for chest pain and leg swelling.  Musculoskeletal: Positive for arthralgias and joint swelling. Negative for back pain and gait problem.  Skin: Negative for rash.  All other systems reviewed and are negative.   Per HPI unless specifically indicated above     Objective:    BP  137/83   Pulse (!) 102   Temp 97.8 F (36.6 C) (Oral)   Ht 5\' 9"  (1.753 m)   Wt 143 lb (64.9 kg)   BMI 21.12 kg/m   Wt Readings from Last 3 Encounters:  11/03/17 143 lb (64.9 kg)  08/16/17 147 lb (66.7 kg)  07/19/17 147 lb (66.7 kg)    Physical Exam  Constitutional: He is oriented to person, place, and time. He appears well-developed and well-nourished. No distress.  Eyes: Conjunctivae are normal. No scleral icterus.  Cardiovascular: Normal rate, regular rhythm, normal heart sounds and intact distal pulses.  No murmur heard. Pulmonary/Chest: Effort normal and breath sounds normal. No respiratory distress. He has no wheezes. He has no rales.  Musculoskeletal: Normal range of motion. He exhibits no edema.       Right shoulder: He exhibits tenderness and pain (Pain with empty can test and internal rotation). He exhibits normal range of motion, no bony tenderness, no swelling, no deformity and normal strength.  Tenderness in both wrists worse with flexion and extension, consistent with osteoarthritis with a mild amount of swelling.  No erythema or warmth.  Neurological: He is alert and oriented to person, place, and time. Coordination normal.  Skin: Skin is warm and dry. No rash noted. He is not diaphoretic.  Psychiatric: He has a normal mood and affect. His behavior is normal.  Nursing  note and vitals reviewed.     Assessment & Plan:   Problem List Items Addressed This Visit    None    Visit Diagnoses    Right rotator cuff tendinitis    -  Primary   Relevant Medications   predniSONE (DELTASONE) 20 MG tablet   Bilateral wrist pain       Likely arthritis from overuse in both wrists   Relevant Medications   predniSONE (DELTASONE) 20 MG tablet       Follow up plan: Return if symptoms worsen or fail to improve.  Counseling provided for all of the vaccine components No orders of the defined types were placed in this encounter.   Arville CareJoshua Mackenzy Eisenberg, MD [facility] 11/03/2017, 1:52 PM

## 2017-11-25 ENCOUNTER — Other Ambulatory Visit: Payer: Self-pay | Admitting: Family Medicine

## 2017-11-25 DIAGNOSIS — M7581 Other shoulder lesions, right shoulder: Secondary | ICD-10-CM

## 2017-11-25 DIAGNOSIS — M25532 Pain in left wrist: Secondary | ICD-10-CM

## 2017-11-25 DIAGNOSIS — M25531 Pain in right wrist: Secondary | ICD-10-CM

## 2017-11-30 ENCOUNTER — Other Ambulatory Visit: Payer: Self-pay | Admitting: *Deleted

## 2017-11-30 DIAGNOSIS — R918 Other nonspecific abnormal finding of lung field: Secondary | ICD-10-CM

## 2017-11-30 DIAGNOSIS — J441 Chronic obstructive pulmonary disease with (acute) exacerbation: Secondary | ICD-10-CM

## 2017-12-18 ENCOUNTER — Other Ambulatory Visit: Payer: Self-pay | Admitting: Family Medicine

## 2017-12-18 DIAGNOSIS — M25531 Pain in right wrist: Secondary | ICD-10-CM

## 2017-12-18 DIAGNOSIS — M25532 Pain in left wrist: Secondary | ICD-10-CM

## 2017-12-18 DIAGNOSIS — M7581 Other shoulder lesions, right shoulder: Secondary | ICD-10-CM

## 2018-01-02 ENCOUNTER — Ambulatory Visit (INDEPENDENT_AMBULATORY_CARE_PROVIDER_SITE_OTHER): Payer: Medicare Other | Admitting: Thoracic Surgery (Cardiothoracic Vascular Surgery)

## 2018-01-02 ENCOUNTER — Ambulatory Visit
Admission: RE | Admit: 2018-01-02 | Discharge: 2018-01-02 | Disposition: A | Discharge status: Home / Self Care | Payer: Worker's Compensation | Source: Ambulatory Visit | Attending: Thoracic Surgery (Cardiothoracic Vascular Surgery) | Admitting: Thoracic Surgery (Cardiothoracic Vascular Surgery)

## 2018-01-02 ENCOUNTER — Encounter: Payer: Self-pay | Admitting: Thoracic Surgery (Cardiothoracic Vascular Surgery)

## 2018-01-02 VITALS — BP 155/98 | HR 98 | Resp 20 | Ht 69.0 in | Wt 147.0 lb

## 2018-01-02 DIAGNOSIS — R918 Other nonspecific abnormal finding of lung field: Secondary | ICD-10-CM | POA: Diagnosis not present

## 2018-01-02 DIAGNOSIS — J441 Chronic obstructive pulmonary disease with (acute) exacerbation: Secondary | ICD-10-CM

## 2018-01-02 DIAGNOSIS — J439 Emphysema, unspecified: Secondary | ICD-10-CM | POA: Diagnosis not present

## 2018-01-02 NOTE — Progress Notes (Signed)
[address]       Zachary [name] [address]             [phone]       HPI: Zachary [name] returns for a scheduled follow-up visit  Zachary [name] is a 67 year old [name] with a history of tobacco abuse and COPD.  He also has a history of gastroesophageal reflux and hypertension.  He developed a cough back in May 2017 and was treated for pneumonia.  His cough persisted.  A CT of the chest showed opacity in the right upper lobe.  A follow-up CT 2 months later showed no change.  Dr. Sherene Sires performed bronchoscopy.  There were atypical cells in the setting of inflammation on pathology.  No tumor was seen.  A PET/CT was done which showed the pleural thickening was hypermetabolic.  CT-guided needle biopsy showed no evidence of cancer.  He has severe COPD and a diffusion capacity of Zachary% of predicted.  There is severe bullous emphysematous changes in the lungs on CT.  He is not a surgical candidate.  Therefore we elected to continue to follow him radiographically.  Most recently I saw him in July.  His CT was unchanged at that time.  He was doing well otherwise.  In the interim since his last visit he saw Dr. Louanne Skye regarding some right shoulder pain in November.  This was felt to be rotator cuff tendinitis.  He also had some osteoarthritis.  Shoulder pain has improved since then.  Today he has no complaints of pain or shortness of breath.  He denies any unusual cough.  He does have a chronic nonproductive cough that is unchanged.  He has not had any change in appetite or weight loss.  Past Medical History:  Diagnosis Date  . Emphysema of lung (HCC) 01/30/2017  . GERD (gastroesophageal reflux disease)   . Hypertension     Current Outpatient Medications  Medication Sig Dispense Refill  . Cholecalciferol (VITAMIN D) 2000 units CAPS Take 2,000 Units by mouth 2 (two) times a week.    . hydrochlorothiazide (MICROZIDE) 12.5 MG capsule Take 1 capsule (12.5 mg total) daily by mouth. 90  capsule 1  . Naproxen Sod-Diphenhydramine (ALEVE PM) 220-25 MG TABS Take 0.5 tablets by mouth at bedtime as needed (sleep).    Marland Kitchen omeprazole (PRILOSEC) 40 MG capsule TAKE 1 CAPSULE (40 MG TOTAL) BY MOUTH DAILY. 90 capsule 1  . predniSONE (DELTASONE) 20 MG tablet TAKE 3 TABS DAILY X 1 WEEK, 2 TABS DAILY X 1 WEEK, 1 TAB DAILY FOR 1 WEEK. 42 tablet 0   No current facility-administered medications for this visit.     Physical Exam BP (!) 155/98   Pulse 98   Resp 20   Ht 5\' 9"  (1.753 m)   Wt 147 lb (66.7 kg)   SpO2 96% Comment: RA  BMI Zachary.60 kg/m  Zachary [name] in no acute distress Alert and oriented x3 with no focal deficits Lungs with rhonchi anteriorly, diminished throughout Cardiac regular rate and rhythm normal S1-S2  Diagnostic Tests: CT CHEST WITHOUT CONTRAST  TECHNIQUE: Multidetector CT imaging of the chest was performed following the standard protocol without IV contrast.  COMPARISON:  06/27/2017  FINDINGS: Cardiovascular: The heart size is normal. Similar appearance trace pericardial thickening or fluid. Coronary artery calcification is evident. Atherosclerotic calcification is noted in the wall of the thoracic aorta.  Mediastinum/Nodes: Similar appearance of the small mediastinal lymph nodes without mediastinal lymphadenopathy. No evidence for gross hilar  lymphadenopathy although assessment is limited by the lack of intravenous contrast on today's study. The esophagus has normal imaging features. There is no axillary lymphadenopathy.  Lungs/Pleura: Marked changes of emphysema noted bilaterally. Bronchiectasis and confluent airspace opacity in the peripheral right upper lung towards the a plaques is similar to prior study. This was measured at 8.5 x 2.5 cm on axial imaging previously. Measuring at a similar level today, this lesion measures 8.3 x 2.4 cm. Appearance is relatively stable comparing coronal reformations between the 2 studies. Volume loss  associated with the lesion generates cranial retraction of the right hilum, similar to prior. No new pulmonary nodule or mass. No new focal airspace consolidation. No pleural effusion.  Upper Abdomen: 14 mm hypoattenuating lesion in the inferior liver has been incompletely visualized but demonstrated no hypermetabolism on a PET-CT from 1 year ago.  Musculoskeletal: Bone windows reveal no worrisome lytic or sclerotic osseous lesions.  IMPRESSION: 1. Stable appearance of the pleural-parenchymal disease in the peripheral right upper lobe with volume loss and retraction of the right hilum. While interval stability is reassuring, continued surveillance may be warranted. 2. Advanced changes of emphysema with bullous disease and areas of architectural distortion. 3. Similar appearance of the scattered small mediastinal lymph nodes as described previously. These have reportedly shown low level hypermetabolism on previous PET imaging.   Electronically Signed   By: Kennith Center M.D.   On: 01/02/2018 15:08 I personally reviewed the CT images and compared them to the CT from July.  I concur with the findings noted above.  Impression: Zachary [name] is a 67 year old gentleman who has a persistent masslike opacity in the right upper lobe near the apex.  This has been present dating back to May 2017.  It has been biopsied twice, once bronchoscopically and once CT-guided.  Both biopsy showed atypical cells but no definite tumor.  Differential diagnosis includes a malignancy with surrounding inflammation or more likely a chronic inflammatory process, i.e. BOOP.  He is not a surgical candidate.  Given the stability of the lesion over time I think continued radiographic follow-up is warranted.  We will plan to CT him again in 6 months and that will be over 2 years since the lesion was first discovered.  Smoking cessation instruction/counseling given:  counseled patient on the dangers of  tobacco use, advised patient to stop smoking, and reviewed strategies to maximize success.  He quit smoking back in June 2018.  He says he does "cheat" from time to time.  I emphasized the importance of absolute cessation given the severity of his emphysema.  Plan: Return in 6 months with CT chest  Loreli Slot, MD [facility] [phone]

## 2018-01-09 ENCOUNTER — Other Ambulatory Visit: Payer: Self-pay | Admitting: Family Medicine

## 2018-01-09 DIAGNOSIS — M7581 Other shoulder lesions, right shoulder: Secondary | ICD-10-CM

## 2018-01-09 DIAGNOSIS — M25531 Pain in right wrist: Secondary | ICD-10-CM

## 2018-01-09 DIAGNOSIS — M25532 Pain in left wrist: Secondary | ICD-10-CM

## 2018-01-09 NOTE — Telephone Encounter (Signed)
Last seen 11/03/17  Dr D

## 2018-02-08 ENCOUNTER — Encounter: Payer: Self-pay | Admitting: Family Medicine

## 2018-02-08 ENCOUNTER — Ambulatory Visit (INDEPENDENT_AMBULATORY_CARE_PROVIDER_SITE_OTHER): Payer: Medicare Other | Admitting: Family Medicine

## 2018-02-08 VITALS — BP 134/81 | HR 105 | Temp 96.7°F | Ht 69.0 in | Wt 146.0 lb

## 2018-02-08 DIAGNOSIS — K2100 Gastro-esophageal reflux disease with esophagitis, without bleeding: Secondary | ICD-10-CM

## 2018-02-08 NOTE — Progress Notes (Signed)
BP 134/81   Pulse (!) 105   Temp (!) 96Zachary [name]7 F (35Zachary [name]9 C) (Oral)   Ht 5\' 9"  (1Zachary [name]753 m)   Wt 146 lb (66Zachary [name]2 kg)   BMI 21Zachary [name]56 kg/m    Subjective:    Patient ID: Zachary [name], male    DOB: 06/14/1951, 67 yZachary Bensono.   MRN: [id number]  HPI: Zachary [name] is a 67 yZachary Bensono. male presenting on 02/08/2018 for Abdominal Pain (abdominal pain, worsens with food, gas; began about 2-3 weeks ago)   HPI Abdominal pain Patient comes in complaining of abdominal pain and bloating and gas that has been going on for about 2-3 weeks.  He says this is similar when he had acid problems previously, he has been still taking his omeprazole but just feels like is not working as well as it was previously.  He denies any blood in his stool or constipation or diarrhea.  He has been using some Pepto-Bismol to try to help as well which does help a little bit but does not last.  He denies any nausea or vomiting but he does admit that his appetite has been less than what it normally is because of this.  He says it does get especially bad at night when he is laying down and he just feels like he cannot sleep because of the burning sensation in his upper stomach.  Relevant past medical, surgical, family and social history reviewed and updated as indicated. Interim medical history since our last visit reviewed. Allergies and medications reviewed and updated.  Review of Systems  Constitutional: Negative for chills and fever.  Respiratory: Negative for shortness of breath and wheezing.   Cardiovascular: Negative for chest pain and leg swelling.  Gastrointestinal: Positive for abdominal pain. Negative for abdominal distention, anal bleeding, constipation, diarrhea, nausea and vomiting.  Musculoskeletal: Negative for back pain and gait problem.  Skin: Negative for rash.  All other systems reviewed and are negative.   Per HPI unless specifically indicated above   Allergies as of 02/08/2018      Reactions   Aspirin Other (See  Comments)   Stomach burning       Medication List        Accurate as of 02/08/18 12:12 PM. Always use your most recent med list.          ALEVE PM 220-25 MG Tabs Generic drug:  Naproxen Sod-diphenhydrAMINE Take 0Zachary [name]5 tablets by mouth at bedtime as needed (sleep).   hydrochlorothiazide 12Zachary [name]5 MG capsule Commonly known as:  MICROZIDE Take 1 capsule (12Zachary [name]5 mg total) daily by mouth.   omeprazole 40 MG capsule Commonly known as:  PRILOSEC TAKE 1 CAPSULE (40 MG TOTAL) BY MOUTH DAILY.   Vitamin D 2000 units Caps Take 2,000 Units by mouth 2 (two) times a week.          Objective:    BP 134/81   Pulse (!) 105   Temp (!) 96Zachary [name]7 F (35Zachary [name]9 C) (Oral)   Ht 5\' 9"  (1Zachary [name]753 m)   Wt 146 lb (66Zachary [name]2 kg)   BMI 21Zachary [name]56 kg/m   Wt Readings from Last 3 Encounters:  02/08/18 146 lb (66Zachary [name]2 kg)  01/02/18 147 lb (66Zachary [name]7 kg)  11/03/17 143 lb (64Zachary [name]9 kg)    Physical Exam  Constitutional: He is oriented to person, place, and time. He appears well-developed and well-nourished. No distress.  Eyes: Conjunctivae are normal. No scleral icterus.  Cardiovascular: Normal rate, regular rhythm, normal heart sounds and intact distal pulses.  No murmur heard. Pulmonary/Chest:  Effort normal and breath sounds normal. No respiratory distress. He has no wheezes. He has no rales.  Abdominal: Soft. Bowel sounds are normal. He exhibits no distension. There is no tenderness (No tenderness on exam). There is no rebound and no guarding.  Musculoskeletal: Normal range of motion. He exhibits no edema.  Neurological: He is alert and oriented to person, place, and time. Coordination normal.  Skin: Skin is warm and dry. No rash noted. He is not diaphoretic.  Psychiatric: He has a normal mood and affect. His behavior is normal.  Nursing note and vitals reviewed.       Assessment & Plan:   Problem List Items Addressed This Visit      Digestive   Gastroesophageal reflux disease with esophagitis - Primary      Has been  uncontrolled as of recently, will give short course of Dexilant, gave sample for 20 days of the Dexilant and then he can try going back on the omeprazole after that.  If not improved return and let us know. Follow up plan: Return if symptoms worsen or fail to improve.  Counseling provided for all of the vaccine components No orders of the defined types were placed in this encounter.   Arville Care, MD [facility] 02/08/2018, 12:12 PM

## 2018-02-21 ENCOUNTER — Ambulatory Visit (INDEPENDENT_AMBULATORY_CARE_PROVIDER_SITE_OTHER): Payer: Medicare Other

## 2018-02-21 ENCOUNTER — Encounter: Payer: Self-pay | Admitting: Family Medicine

## 2018-02-21 ENCOUNTER — Encounter (INDEPENDENT_AMBULATORY_CARE_PROVIDER_SITE_OTHER): Payer: Self-pay

## 2018-02-21 ENCOUNTER — Ambulatory Visit (INDEPENDENT_AMBULATORY_CARE_PROVIDER_SITE_OTHER): Payer: Medicare Other | Admitting: Family Medicine

## 2018-02-21 VITALS — BP 137/89 | HR 109 | Temp 97.9°F | Ht 69.0 in | Wt 155.0 lb

## 2018-02-21 DIAGNOSIS — K2100 Gastro-esophageal reflux disease with esophagitis, without bleeding: Secondary | ICD-10-CM

## 2018-02-21 DIAGNOSIS — R0609 Other forms of dyspnea: Secondary | ICD-10-CM

## 2018-02-21 DIAGNOSIS — R609 Edema, unspecified: Secondary | ICD-10-CM

## 2018-02-21 DIAGNOSIS — J441 Chronic obstructive pulmonary disease with (acute) exacerbation: Secondary | ICD-10-CM | POA: Diagnosis not present

## 2018-02-21 DIAGNOSIS — R0602 Shortness of breath: Secondary | ICD-10-CM | POA: Diagnosis not present

## 2018-02-21 MED ORDER — BUDESONIDE-FORMOTEROL FUMARATE 160-4.5 MCG/ACT IN AERO: 2.0000 | INHALATION_SPRAY | Freq: Two times a day (BID) | RESPIRATORY_TRACT | 3 refills | Status: DC

## 2018-02-21 MED ORDER — ALBUTEROL SULFATE HFA 108 (90 BASE) MCG/ACT IN AERS: 2.0000 | INHALATION_SPRAY | Freq: Four times a day (QID) | RESPIRATORY_TRACT | 0 refills | Status: AC | PRN

## 2018-02-21 MED ORDER — AZITHROMYCIN 250 MG PO TABS: ORAL_TABLET | ORAL | 0 refills | Status: DC

## 2018-02-21 MED ORDER — PREDNISONE 20 MG PO TABS: ORAL_TABLET | ORAL | 0 refills | Status: DC

## 2018-02-21 NOTE — Progress Notes (Signed)
BP 137/89   Pulse (!) 109   Temp 97.9 F (36.6 C) (Oral)   Ht _0  (1.753 m)   Wt 155 lb (70.3 kg)   SpO2 (!) 87%   BMI 22.89 kg/m    Subjective:    Patient ID: Zachary [name], male    DOB: 1951-03-31, 67 y.o.   MRN: [id number]  HPI: Zachary [name] is a 67 y.o. male presenting on 02/21/2018 for Discuss Dexilant; Edema in feet, shortness of breath (9 lb weight gain since 02/08/18); and Still smells "fumes" at times   HPI Shortness of breath and congestion and swelling in his legs Patient has been having shortness of breath and congestion is been going on for the past 1 month.  The swelling is new since he went on Dexilant and he thinks it may be because of that.  He has swelling in both of his lower extremities in the ankles and the feet.  He says he is also been getting more short of breath with shorter distances of walking and stairs and he feels more fatigued and winded and that things just have not been improving.  He is tried the dose of prednisone that we gave him and he has been using Brio Ellipta and felt like is not helping much so he stopped it.  He denies any chest pain or palpitations.  Patient also comes in complaining of GERD and says that the [place] just did not help and he still having a lot of acid reflux and irritation in his throat along with the swelling in his legs.  He has been on multiple medications for this in the past and feels like food gets stuck in his throat along with fluid sometimes but mostly thick things like bread get caught in his throat.  He says that he smells fumes at times are like an acid taste that comes with a smell when he is feeling the heartburn and irritation.  Relevant past medical, surgical, family and social history reviewed and updated as indicated. Interim medical history since our last visit reviewed. Allergies and medications reviewed and updated.  Review of Systems  Constitutional: Negative for chills and fever.  Respiratory:  Positive for shortness of breath. Negative for cough and wheezing.   Cardiovascular: Positive for leg swelling. Negative for chest pain and palpitations.  Gastrointestinal: Positive for abdominal distention and abdominal pain. Negative for blood in stool, constipation, diarrhea, nausea and vomiting.  Musculoskeletal: Negative for back pain and gait problem.  Skin: Negative for rash.  All other systems reviewed and are negative.   Per HPI unless specifically indicated above   Allergies as of 02/21/2018      Reactions   Aspirin Other (See Comments)   Stomach burning       Medication List        Accurate as of 02/21/18 11:28 AM. Always use your most recent med list.          albuterol 108 (90 Base) MCG/ACT inhaler Commonly known as:  PROVENTIL HFA;VENTOLIN HFA Inhale 2 puffs into the lungs every 6 (six) hours as needed for wheezing or shortness of breath.   ALEVE PM 220-25 MG Tabs Generic drug:  Naproxen Sod-diphenhydrAMINE Take 0.5 tablets by mouth at bedtime as needed (sleep).   azithromycin 250 MG tablet Commonly known as:  ZITHROMAX Take 2 the first day and then one each day after.   budesonide-formoterol 160-4.5 MCG/ACT inhaler Commonly known as:  SYMBICORT Inhale 2 puffs into  the lungs 2 (two) times daily.   hydrochlorothiazide 12.5 MG capsule Commonly known as:  MICROZIDE Take 1 capsule (12.5 mg total) daily by mouth.   omeprazole 40 MG capsule Commonly known as:  PRILOSEC TAKE 1 CAPSULE (40 MG TOTAL) BY MOUTH DAILY.   predniSONE 20 MG tablet Commonly known as:  DELTASONE Take 3 tabs daily for 1 week, then 2 tabs daily for week 2, then 1 tab daily for week 3.   Vitamin D 2000 units Caps Take 2,000 Units by mouth 2 (two) times a week.          Objective:    BP 137/89   Pulse (!) 109   Temp 97.9 F (36.6 C) (Oral)   Ht _0  (1.753 m)   Wt 155 lb (70.3 kg)   SpO2 (!) 87%   BMI 22.89 kg/m   Wt Readings from Last 3 Encounters:  02/21/18 155 lb (70.3  kg)  02/08/18 146 lb (66.2 kg)  01/02/18 147 lb (66.7 kg)    Physical Exam  Constitutional: He is oriented to person, place, and time. He appears well-developed and well-nourished. No distress.  Eyes: Conjunctivae are normal. No scleral icterus.  Neck: Neck supple. No thyromegaly present.  Cardiovascular: Normal rate, regular rhythm, normal heart sounds and intact distal pulses.  No murmur heard. Pulmonary/Chest: Effort normal and breath sounds normal. No respiratory distress. He has no wheezes. He has no rales.  Abdominal: Soft. Bowel sounds are normal. He exhibits no distension. There is no tenderness. There is no rebound.  Musculoskeletal: Normal range of motion. He exhibits edema (1+ edema).  Lymphadenopathy:    He has no cervical adenopathy.  Neurological: He is alert and oriented to person, place, and time. Coordination normal.  Skin: Skin is warm and dry. No rash noted. He is not diaphoretic.  Psychiatric: He has a normal mood and affect. His behavior is normal.  Nursing note and vitals reviewed.   Chest x-ray: COPD changes, mild vascular congestion, otherwise negative, await final read from radiologist    Assessment & Plan:   Problem List Items Addressed This Visit      Digestive   Gastroesophageal reflux disease with esophagitis   Relevant Orders   Ambulatory referral to Gastroenterology   CBC with Differential/Platelet (Completed)   CMP14+EGFR (Completed)   TSH (Completed)    Other Visit Diagnoses    Shortness of breath    -  Primary   Relevant Orders   DG Chest 2 View (Completed)   CBC with Differential/Platelet (Completed)   CMP14+EGFR (Completed)   TSH (Completed)   Peripheral edema       Relevant Orders   DG Chest 2 View (Completed)   Ambulatory referral to Cardiology   CBC with Differential/Platelet (Completed)   CMP14+EGFR (Completed)   TSH (Completed)   Exertional dyspnea       Relevant Orders   Ambulatory referral to Cardiology   CBC with  Differential/Platelet (Completed)   CMP14+EGFR (Completed)   TSH (Completed)   COPD exacerbation (HCC)       Relevant Medications   budesonide-formoterol (SYMBICORT) 160-4.5 MCG/ACT inhaler   azithromycin (ZITHROMAX) 250 MG tablet   albuterol (PROVENTIL HFA;VENTOLIN HFA) 108 (90 Base) MCG/ACT inhaler   predniSONE (DELTASONE) 20 MG tablet   Other Relevant Orders   CBC with Differential/Platelet (Completed)   CMP14+EGFR (Completed)   TSH (Completed)       Follow up plan: Return if symptoms worsen or fail to improve.  Counseling provided for  all of the vaccine components Orders Placed This Encounter  Procedures  . DG Chest 2 View  . Ambulatory referral to Cardiology  . Ambulatory referral to Gastroenterology    Caryl Pina, MD [facility] 02/21/2018, 11:28 AM

## 2018-02-22 LAB — CMP14+EGFR
ALK PHOS: 80 IU/L (ref 39–117)
ALT: 34 IU/L (ref 0–44)
AST: 26 IU/L (ref 0–40)
Albumin/Globulin Ratio: 0.9 — ABNORMAL LOW (ref 1.2–2.2)
Albumin: 3.5 g/dL — ABNORMAL LOW (ref 3.6–4.8)
BUN/Creatinine Ratio: 19 (ref 10–24)
BUN: 22 mg/dL (ref 8–27)
Bilirubin Total: 0.6 mg/dL (ref 0.0–1.2)
CALCIUM: 9 mg/dL (ref 8.6–10.2)
CO2: 24 mmol/L (ref 20–29)
CREATININE: 1.18 mg/dL (ref 0.76–1.27)
Chloride: 103 mmol/L (ref 96–106)
GFR calc Af Amer: 73 mL/min/{1.73_m2} (ref >59)
GFR, EST NON AFRICAN AMERICAN: 63 mL/min/{1.73_m2} (ref >59)
Globulin, Total: 3.7 g/dL (ref 1.5–4.5)
Glucose: 78 mg/dL (ref 65–99)
Potassium: 4.9 mmol/L (ref 3.5–5.2)
Sodium: 142 mmol/L (ref 134–144)
Total Protein: 7.2 g/dL (ref 6.0–8.5)

## 2018-02-22 LAB — CBC WITH DIFFERENTIAL/PLATELET
BASOS ABS: 0 10*3/uL (ref 0.0–0.2)
BASOS: 0 %
EOS (ABSOLUTE): 0.2 10*3/uL (ref 0.0–0.4)
Eos: 3 %
Hematocrit: 44 % (ref 37.5–51.0)
Hemoglobin: 14.2 g/dL (ref 13.0–17.7)
Immature Grans (Abs): 0 10*3/uL (ref 0.0–0.1)
Immature Granulocytes: 0 %
LYMPHS ABS: 1.9 10*3/uL (ref 0.7–3.1)
Lymphs: 30 %
MCH: 28.1 pg (ref 26.6–33.0)
MCHC: 32.3 g/dL (ref 31.5–35.7)
MCV: 87 fL (ref 79–97)
MONOS ABS: 0.7 10*3/uL (ref 0.1–0.9)
Monocytes: 11 %
NEUTROS ABS: 3.6 10*3/uL (ref 1.4–7.0)
Neutrophils: 56 %
PLATELETS: 251 10*3/uL (ref 150–379)
RBC: 5.06 x10E6/uL (ref 4.14–5.80)
RDW: 16.2 % — ABNORMAL HIGH (ref 12.3–15.4)
WBC: 6.4 10*3/uL (ref 3.4–10.8)

## 2018-02-22 LAB — TSH: TSH: 1.44 u[IU]/mL (ref 0.450–4.500)

## 2018-02-27 ENCOUNTER — Telehealth: Payer: Self-pay | Admitting: Family Medicine

## 2018-02-27 NOTE — Telephone Encounter (Signed)
appt scheduled for evaluation 

## 2018-02-28 ENCOUNTER — Emergency Department (HOSPITAL_COMMUNITY): Payer: Medicare Other

## 2018-02-28 ENCOUNTER — Encounter (HOSPITAL_COMMUNITY): Payer: Self-pay | Admitting: Emergency Medicine

## 2018-02-28 ENCOUNTER — Ambulatory Visit (HOSPITAL_BASED_OUTPATIENT_CLINIC_OR_DEPARTMENT_OTHER)
Admission: RE | Admit: 2018-02-28 | Discharge: 2018-02-28 | Disposition: A | Discharge status: Home / Self Care | Payer: Medicare Other | Source: Ambulatory Visit | Attending: Family Medicine | Admitting: Family Medicine

## 2018-02-28 ENCOUNTER — Encounter: Payer: Self-pay | Admitting: Family Medicine

## 2018-02-28 ENCOUNTER — Ambulatory Visit (INDEPENDENT_AMBULATORY_CARE_PROVIDER_SITE_OTHER): Payer: Medicare Other | Admitting: Family Medicine

## 2018-02-28 ENCOUNTER — Other Ambulatory Visit: Payer: Self-pay

## 2018-02-28 ENCOUNTER — Inpatient Hospital Stay (HOSPITAL_COMMUNITY)
Admission: EM | Admit: 2018-02-28 | Discharge: 2018-03-06 | DRG: 286 | Disposition: A | Discharge status: Home / Self Care | Payer: Medicare Other | Attending: Internal Medicine | Admitting: Internal Medicine

## 2018-02-28 ENCOUNTER — Ambulatory Visit (HOSPITAL_COMMUNITY)
Admission: RE | Admit: 2018-02-28 | Discharge: 2018-02-28 | Disposition: A | Discharge status: Home / Self Care | Payer: Medicare Other | Source: Ambulatory Visit | Attending: Family Medicine | Admitting: Family Medicine

## 2018-02-28 VITALS — BP 137/94 | HR 122 | Temp 96.8°F | Ht 69.0 in | Wt 154.6 lb

## 2018-02-28 DIAGNOSIS — I429 Cardiomyopathy, unspecified: Secondary | ICD-10-CM | POA: Diagnosis not present

## 2018-02-28 DIAGNOSIS — I5082 Biventricular heart failure: Secondary | ICD-10-CM | POA: Diagnosis present

## 2018-02-28 DIAGNOSIS — I130 Hypertensive heart and chronic kidney disease with heart failure and stage 1 through stage 4 chronic kidney disease, or unspecified chronic kidney disease: Secondary | ICD-10-CM | POA: Diagnosis not present

## 2018-02-28 DIAGNOSIS — R0902 Hypoxemia: Secondary | ICD-10-CM

## 2018-02-28 DIAGNOSIS — I313 Pericardial effusion (noninflammatory): Secondary | ICD-10-CM

## 2018-02-28 DIAGNOSIS — I5023 Acute on chronic systolic (congestive) heart failure: Secondary | ICD-10-CM | POA: Diagnosis present

## 2018-02-28 DIAGNOSIS — R0602 Shortness of breath: Secondary | ICD-10-CM

## 2018-02-28 DIAGNOSIS — I5020 Unspecified systolic (congestive) heart failure: Secondary | ICD-10-CM | POA: Diagnosis not present

## 2018-02-28 DIAGNOSIS — J9601 Acute respiratory failure with hypoxia: Secondary | ICD-10-CM | POA: Diagnosis not present

## 2018-02-28 DIAGNOSIS — Z87891 Personal history of nicotine dependence: Secondary | ICD-10-CM

## 2018-02-28 DIAGNOSIS — J61 Pneumoconiosis due to asbestos and other mineral fibers: Secondary | ICD-10-CM

## 2018-02-28 DIAGNOSIS — Z7951 Long term (current) use of inhaled steroids: Secondary | ICD-10-CM | POA: Diagnosis not present

## 2018-02-28 DIAGNOSIS — I10 Essential (primary) hypertension: Secondary | ICD-10-CM | POA: Diagnosis not present

## 2018-02-28 DIAGNOSIS — N179 Acute kidney failure, unspecified: Secondary | ICD-10-CM | POA: Diagnosis not present

## 2018-02-28 DIAGNOSIS — K219 Gastro-esophageal reflux disease without esophagitis: Secondary | ICD-10-CM | POA: Insufficient documentation

## 2018-02-28 DIAGNOSIS — I509 Heart failure, unspecified: Secondary | ICD-10-CM

## 2018-02-28 DIAGNOSIS — I272 Pulmonary hypertension, unspecified: Secondary | ICD-10-CM | POA: Diagnosis not present

## 2018-02-28 DIAGNOSIS — N183 Chronic kidney disease, stage 3 unspecified: Secondary | ICD-10-CM

## 2018-02-28 DIAGNOSIS — R609 Edema, unspecified: Secondary | ICD-10-CM

## 2018-02-28 DIAGNOSIS — R000 Tachycardia, unspecified: Secondary | ICD-10-CM | POA: Diagnosis not present

## 2018-02-28 DIAGNOSIS — J441 Chronic obstructive pulmonary disease with (acute) exacerbation: Secondary | ICD-10-CM | POA: Diagnosis not present

## 2018-02-28 DIAGNOSIS — I2781 Cor pulmonale (chronic): Secondary | ICD-10-CM

## 2018-02-28 DIAGNOSIS — I361 Nonrheumatic tricuspid (valve) insufficiency: Secondary | ICD-10-CM | POA: Insufficient documentation

## 2018-02-28 DIAGNOSIS — I071 Rheumatic tricuspid insufficiency: Secondary | ICD-10-CM | POA: Diagnosis not present

## 2018-02-28 DIAGNOSIS — J84112 Idiopathic pulmonary fibrosis: Secondary | ICD-10-CM | POA: Diagnosis not present

## 2018-02-28 DIAGNOSIS — I2510 Atherosclerotic heart disease of native coronary artery without angina pectoris: Secondary | ICD-10-CM | POA: Diagnosis not present

## 2018-02-28 DIAGNOSIS — R0600 Dyspnea, unspecified: Secondary | ICD-10-CM | POA: Diagnosis not present

## 2018-02-28 DIAGNOSIS — R05 Cough: Secondary | ICD-10-CM | POA: Diagnosis not present

## 2018-02-28 DIAGNOSIS — J479 Bronchiectasis, uncomplicated: Secondary | ICD-10-CM | POA: Diagnosis not present

## 2018-02-28 DIAGNOSIS — Z8249 Family history of ischemic heart disease and other diseases of the circulatory system: Secondary | ICD-10-CM

## 2018-02-28 DIAGNOSIS — J439 Emphysema, unspecified: Secondary | ICD-10-CM | POA: Diagnosis not present

## 2018-02-28 DIAGNOSIS — T502X5A Adverse effect of carbonic-anhydrase inhibitors, benzothiadiazides and other diuretics, initial encounter: Secondary | ICD-10-CM | POA: Diagnosis not present

## 2018-02-28 DIAGNOSIS — J9621 Acute and chronic respiratory failure with hypoxia: Secondary | ICD-10-CM | POA: Diagnosis not present

## 2018-02-28 DIAGNOSIS — I2609 Other pulmonary embolism with acute cor pulmonale: Secondary | ICD-10-CM | POA: Diagnosis not present

## 2018-02-28 DIAGNOSIS — I5021 Acute systolic (congestive) heart failure: Secondary | ICD-10-CM | POA: Diagnosis not present

## 2018-02-28 DIAGNOSIS — J449 Chronic obstructive pulmonary disease, unspecified: Secondary | ICD-10-CM | POA: Diagnosis not present

## 2018-02-28 DIAGNOSIS — R600 Localized edema: Secondary | ICD-10-CM

## 2018-02-28 DIAGNOSIS — I5032 Chronic diastolic (congestive) heart failure: Secondary | ICD-10-CM | POA: Diagnosis not present

## 2018-02-28 DIAGNOSIS — I5033 Acute on chronic diastolic (congestive) heart failure: Secondary | ICD-10-CM | POA: Diagnosis not present

## 2018-02-28 DIAGNOSIS — I270 Primary pulmonary hypertension: Secondary | ICD-10-CM | POA: Diagnosis not present

## 2018-02-28 DIAGNOSIS — Z9981 Dependence on supplemental oxygen: Secondary | ICD-10-CM | POA: Diagnosis not present

## 2018-02-28 DIAGNOSIS — J438 Other emphysema: Secondary | ICD-10-CM | POA: Diagnosis not present

## 2018-02-28 HISTORY — DX: Chronic obstructive pulmonary disease, unspecified: J44.9

## 2018-02-28 HISTORY — DX: Contact with and (suspected) exposure to asbestos: Z77.090

## 2018-02-28 HISTORY — DX: Essential (primary) hypertension: I10

## 2018-02-28 HISTORY — DX: Other nonspecific abnormal finding of lung field: R91.8

## 2018-02-28 LAB — CBC WITH DIFFERENTIAL/PLATELET
BASOS PCT: 0 %
Basophils Absolute: 0 10*3/uL (ref 0.0–0.1)
Eosinophils Absolute: 0.1 10*3/uL (ref 0.0–0.7)
Eosinophils Relative: 1 %
HEMATOCRIT: 43.4 % (ref 39.0–52.0)
HEMOGLOBIN: 13.7 g/dL (ref 13.0–17.0)
Lymphocytes Relative: 32 %
Lymphs Abs: 2.6 10*3/uL (ref 0.7–4.0)
MCH: 27.7 pg (ref 26.0–34.0)
MCHC: 31.6 g/dL (ref 30.0–36.0)
MCV: 87.7 fL (ref 78.0–100.0)
MONOS PCT: 10 %
Monocytes Absolute: 0.8 10*3/uL (ref 0.1–1.0)
NEUTROS ABS: 4.6 10*3/uL (ref 1.7–7.7)
NEUTROS PCT: 57 %
Platelets: 247 10*3/uL (ref 150–400)
RBC: 4.95 MIL/uL (ref 4.22–5.81)
RDW: 15.4 % (ref 11.5–15.5)
WBC: 8.1 10*3/uL (ref 4.0–10.5)

## 2018-02-28 LAB — D-DIMER, QUANTITATIVE (NOT AT ARMC): D DIMER QUANT: 0.52 ug{FEU}/mL — AB (ref 0.00–0.50)

## 2018-02-28 LAB — BASIC METABOLIC PANEL
ANION GAP: 11 (ref 5–15)
BUN: 29 mg/dL — ABNORMAL HIGH (ref 6–20)
CO2: 25 mmol/L (ref 22–32)
Calcium: 9.5 mg/dL (ref 8.9–10.3)
Chloride: 108 mmol/L (ref 101–111)
Creatinine, Ser: 1.33 mg/dL — ABNORMAL HIGH (ref 0.61–1.24)
GFR calc non Af Amer: 54 mL/min — ABNORMAL LOW (ref >60)
Glucose, Bld: 83 mg/dL (ref 65–99)
Potassium: 4.4 mmol/L (ref 3.5–5.1)
Sodium: 144 mmol/L (ref 135–145)

## 2018-02-28 LAB — ECHOCARDIOGRAM COMPLETE
Height: 69 in
Weight: 2473.6 oz

## 2018-02-28 LAB — TROPONIN I
Troponin I: 0.03 ng/mL (ref <0.03)
Troponin I: <0.03 ng/mL (ref <0.03)

## 2018-02-28 LAB — BRAIN NATRIURETIC PEPTIDE: B NATRIURETIC PEPTIDE 5: 421 pg/mL — AB (ref 0.0–100.0)

## 2018-02-28 MED ORDER — METHYLPREDNISOLONE SODIUM SUCC 125 MG IJ SOLR
125.0000 mg | Freq: Once | INTRAMUSCULAR | Status: AC
  Administered 2018-02-28: 125 mg via INTRAVENOUS
  Filled 2018-02-28: qty 2

## 2018-02-28 MED ORDER — NAPROXEN SOD-DIPHENHYDRAMINE 220-25 MG PO TABS: 0.5000 | ORAL_TABLET | Freq: Every evening | ORAL | Status: DC | PRN

## 2018-02-28 MED ORDER — IPRATROPIUM-ALBUTEROL 0.5-2.5 (3) MG/3ML IN SOLN
3.0000 mL | Freq: Once | RESPIRATORY_TRACT | Status: AC
  Administered 2018-02-28: 3 mL via RESPIRATORY_TRACT
  Filled 2018-02-28: qty 3

## 2018-02-28 MED ORDER — MOMETASONE FURO-FORMOTEROL FUM 200-5 MCG/ACT IN AERO
2.0000 | INHALATION_SPRAY | Freq: Two times a day (BID) | RESPIRATORY_TRACT | Status: DC
  Administered 2018-03-01 – 2018-03-06 (×8): 2 via RESPIRATORY_TRACT
  Filled 2018-02-28 (×2): qty 8.8

## 2018-02-28 MED ORDER — SODIUM CHLORIDE 0.9 % IV SOLN: 250.0000 mL | INTRAVENOUS | Status: DC | PRN

## 2018-02-28 MED ORDER — ALBUTEROL SULFATE (2.5 MG/3ML) 0.083% IN NEBU: 2.5000 mg | INHALATION_SOLUTION | Freq: Four times a day (QID) | RESPIRATORY_TRACT | Status: DC | PRN

## 2018-02-28 MED ORDER — POLYVINYL ALCOHOL 1.4 % OP SOLN
1.0000 [drp] | OPHTHALMIC | Status: DC | PRN
  Filled 2018-02-28: qty 15

## 2018-02-28 MED ORDER — HYDRALAZINE HCL 25 MG PO TABS
25.0000 mg | ORAL_TABLET | Freq: Three times a day (TID) | ORAL | Status: DC
  Administered 2018-02-28 – 2018-03-05 (×13): 25 mg via ORAL
  Filled 2018-02-28 (×13): qty 1

## 2018-02-28 MED ORDER — PANTOPRAZOLE SODIUM 40 MG PO TBEC
40.0000 mg | DELAYED_RELEASE_TABLET | Freq: Every day | ORAL | Status: DC
  Administered 2018-03-01 – 2018-03-06 (×6): 40 mg via ORAL
  Filled 2018-02-28 (×6): qty 1

## 2018-02-28 MED ORDER — SODIUM CHLORIDE 0.9% FLUSH
3.0000 mL | INTRAVENOUS | Status: DC | PRN
Start: 2018-02-28 — End: 2018-03-06

## 2018-02-28 MED ORDER — ACETAMINOPHEN 325 MG PO TABS
650.0000 mg | ORAL_TABLET | ORAL | Status: DC | PRN
  Administered 2018-03-05 – 2018-03-06 (×2): 650 mg via ORAL
  Filled 2018-02-28 (×2): qty 2

## 2018-02-28 MED ORDER — FUROSEMIDE 10 MG/ML IJ SOLN
40.0000 mg | Freq: Two times a day (BID) | INTRAMUSCULAR | Status: DC
  Administered 2018-02-28 – 2018-03-02 (×4): 40 mg via INTRAVENOUS
  Filled 2018-02-28 (×4): qty 4

## 2018-02-28 MED ORDER — HYDROCHLOROTHIAZIDE 12.5 MG PO CAPS
12.5000 mg | ORAL_CAPSULE | Freq: Every day | ORAL | Status: DC
  Administered 2018-03-01 – 2018-03-02 (×2): 12.5 mg via ORAL
  Filled 2018-02-28 (×4): qty 1

## 2018-02-28 MED ORDER — HYPROMELLOSE (GONIOSCOPIC) 2.5 % OP SOLN: 1.0000 [drp] | OPHTHALMIC | Status: DC | PRN

## 2018-02-28 MED ORDER — ONDANSETRON HCL 4 MG/2ML IJ SOLN: 4.0000 mg | Freq: Four times a day (QID) | INTRAMUSCULAR | Status: DC | PRN

## 2018-02-28 MED ORDER — SODIUM CHLORIDE 0.9% FLUSH
3.0000 mL | Freq: Two times a day (BID) | INTRAVENOUS | Status: DC
  Administered 2018-03-01 – 2018-03-06 (×10): 3 mL via INTRAVENOUS

## 2018-02-28 NOTE — Progress Notes (Signed)
*  PRELIMINARY RESULTS* Echocardiogram 2D Echocardiogram has been performed.  Zachary [name] 02/28/2018, 5:17 PM

## 2018-02-28 NOTE — H&P (Signed)
History and Physical    OCTAVIOUS ZIDEK ZOX:[id number] DOB: May 05, 1951 DOA: 02/28/2018  PCP: Dettinger, Elige Radon, MD  Patient coming from: Home  Chief Complaint: Lower extremity swelling and shortness of breath  HPI: NESTER BACHUS is a 67 y.o. male with medical history significant of COPD, hypertension, chronic right upper lobe infiltrate since pneumonia back in 2017 comes in with about a month of worsening shortness of breath and 2 weeks of bilateral lower extremity swelling.  Patient reports no fevers and no cough.  He saw his primary care physician today at which point he was tachycardic and his oxygen sats were around 80%.  He was sent for further workup with outpatient bilateral lower extremity ultrasound to rule out DVT which was negative and a cardiac echocardiogram which has been done but results are pending.  Patient does not have a prior history of congestive heart failure.  Patient today is indeed hypoxic in the emergency department as he was sent here from outpatient radiology department for hypoxia.  He denies any chest pain.  He has no risk factors for pulmonary emboli.  He does have chronic lung disease but does not require home oxygen supplementation.  Patient is being referred for admission for acute hypoxic respiratory failure of unclear etiology.  Review of Systems: As per HPI otherwise 10 point review of systems negative.   Past Medical History:  Diagnosis Date  . Emphysema of lung (HCC) 01/30/2017  . GERD (gastroesophageal reflux disease)   . Hypertension     Past Surgical History:  Procedure Laterality Date  . VIDEO BRONCHOSCOPY Bilateral 12/07/2016   Procedure: VIDEO BRONCHOSCOPY WITH FLUORO;  Surgeon: Nyoka Cowden, MD;  Location: [facility];  Service: Cardiopulmonary;  Laterality: Bilateral;     reports that he quit smoking about 9 months ago. His smoking use included cigarettes. He started smoking about 44 years ago. He smoked 0.10 packs per day. he has  never used smokeless tobacco. He reports that he does not drink alcohol or use drugs.  Allergies  Allergen Reactions  . Aspirin Other (See Comments)    Stomach burning     Family History  Problem Relation Age of Onset  . Heart disease Father     Prior to Admission medications   Medication Sig Start Date End Date Taking? Authorizing Provider  albuterol (PROVENTIL HFA;VENTOLIN HFA) 108 (90 Base) MCG/ACT inhaler Inhale 2 puffs into the lungs every 6 (six) hours as needed for wheezing or shortness of breath. 02/21/18  Yes Dettinger, Elige Radon, MD  budesonide-formoterol (SYMBICORT) 160-4.5 MCG/ACT inhaler Inhale 2 puffs into the lungs 2 (two) times daily. 02/21/18  Yes Dettinger, Elige Radon, MD  hydrochlorothiazide (MICROZIDE) 12.5 MG capsule Take 1 capsule (12.5 mg total) daily by mouth. 11/03/17  Yes Dettinger, Elige Radon, MD  hydroxypropyl methylcellulose / hypromellose (ISOPTO TEARS / GONIOVISC) 2.5 % ophthalmic solution Place 1 drop into both eyes as needed for dry eyes.   Yes [provider]  Naproxen Sod-Diphenhydramine (ALEVE PM) 220-25 MG TABS Take 0.5 tablets by mouth at bedtime as needed (sleep).   Yes [provider]  omeprazole (PRILOSEC) 40 MG capsule TAKE 1 CAPSULE (40 MG TOTAL) BY MOUTH DAILY. 11/03/17  Yes Dettinger, Elige Radon, MD  predniSONE (DELTASONE) 20 MG tablet Take 3 tabs daily for 1 week, then 2 tabs daily for week 2, then 1 tab daily for week 3. Patient taking differently: Take 20 mg by mouth daily as needed (for appetite).  02/21/18  Yes Dettinger,  Elige Radon, MD    Physical Exam: Vitals:   02/28/18 1930 02/28/18 2030 02/28/18 2046 02/28/18 2100  BP: (!) 134/106 (!) 141/106  (!) 130/104  Pulse: (!) 101 (!) 106  (!) 110  Resp: (!) 21 18  (!) 21  Temp:      SpO2: 97% 91% 93% 93%      Constitutional: NAD, calm, comfortable Vitals:   02/28/18 1930 02/28/18 2030 02/28/18 2046 02/28/18 2100  BP: (!) 134/106 (!) 141/106  (!) 130/104  Pulse: (!) 101 (!) 106   (!) 110  Resp: (!) 21 18  (!) 21  Temp:      SpO2: 97% 91% 93% 93%   Eyes: PERRL, lids and conjunctivae normal ENMT: Mucous membranes are moist. Posterior pharynx clear of any exudate or lesions.Normal dentition.  Neck: normal, supple, no masses, no thyromegaly Respiratory: clear to auscultation bilaterally, no wheezing, no crackles. Normal respiratory effort. No accessory muscle use.  Cardiovascular: Regular rate and rhythm, no murmurs / rubs / gallops.  3+ extremity edema. 2+ pedal pulses. No carotid bruits.  Abdomen: no tenderness, no masses palpated. No hepatosplenomegaly. Bowel sounds positive.  Musculoskeletal: no clubbing / cyanosis. No joint deformity upper and lower extremities. Good ROM, no contractures. Normal muscle tone.  Skin: no rashes, lesions, ulcers. No induration Neurologic: CN 2-12 grossly intact. Sensation intact, DTR normal. Strength 5/5 in all 4.  Psychiatric: Normal judgment and insight. Alert and oriented x 3. Normal mood.    Labs on Admission: I have personally reviewed following labs and imaging studies  CBC: Recent Labs  Lab 02/28/18 1840  WBC 8.1  NEUTROABS 4.6  HGB 13.7  HCT 43.4  MCV 87.7  PLT 247   Basic Metabolic Panel: Recent Labs  Lab 02/28/18 1840  NA 144  K 4.4  CL 108  CO2 25  GLUCOSE 83  BUN 29*  CREATININE 1.33*  CALCIUM 9.5   GFR: Estimated Creatinine Clearance: 53.4 mL/min (A) (by C-G formula based on SCr of 1.33 mg/dL (H)). Liver Function Tests: No results for input(s): AST, ALT, ALKPHOS, BILITOT, PROT, ALBUMIN in the last 168 hours. No results for input(s): LIPASE, AMYLASE in the last 168 hours. No results for input(s): AMMONIA in the last 168 hours. Coagulation Profile: No results for input(s): INR, PROTIME in the last 168 hours. Cardiac Enzymes: Recent Labs  Lab 02/28/18 1912  TROPONINI 0.03*   BNP (last 3 results) No results for input(s): PROBNP in the last 8760 hours. HbA1C: No results for input(s): HGBA1C in  the last 72 hours. CBG: No results for input(s): GLUCAP in the last 168 hours. Lipid Profile: No results for input(s): CHOL, HDL, LDLCALC, TRIG, CHOLHDL, LDLDIRECT in the last 72 hours. Thyroid Function Tests: No results for input(s): TSH, T4TOTAL, FREET4, T3FREE, THYROIDAB in the last 72 hours. Anemia Panel: No results for input(s): VITAMINB12, FOLATE, FERRITIN, TIBC, IRON, RETICCTPCT in the last 72 hours. Urine analysis: No results found for: COLORURINE, APPEARANCEUR, LABSPEC, PHURINE, GLUCOSEU, HGBUR, BILIRUBINUR, KETONESUR, PROTEINUR, UROBILINOGEN, NITRITE, LEUKOCYTESUR Sepsis Labs: !!!!!!!!!!!!!!!!!!!!!!!!!!!!!!!!!!!!!!!!!!!! @LABRCNTIP (procalcitonin:4,lacticidven:4) )No results found for this or any previous visit (from the past 240 hour(s)).   Radiological Exams on Admission: Dg Chest 2 View  Result Date: 02/28/2018 CLINICAL DATA:  Shortness of breath.  Pneumonia.  Emphysema. EXAM: CHEST - 2 VIEW COMPARISON:  Most recent chest 02/21/2018. FINDINGS: Normal heart size. Pleuroparenchymal disease in the peripheral RIGHT upper lobe with volume loss and retraction of the RIGHT hilum appears stable. No definite acute infiltrate. Chronic interstitial prominence.  Emphysema. No osseous findings. IMPRESSION: COPD.  Stable pleuroparenchymal scarring. Electronically Signed   By: Elsie Stain M.D.   On: 02/28/2018 18:52   US Venous Img Lower Bilateral  Result Date: 02/28/2018 CLINICAL DATA:  66 year old male with a history of shortness of breath and swelling EXAM: BILATERAL LOWER EXTREMITY VENOUS DOPPLER ULTRASOUND TECHNIQUE: Gray-scale sonography with graded compression, as well as color Doppler and duplex ultrasound were performed to evaluate the lower extremity deep venous systems from the level of the common femoral vein and including the common femoral, femoral, profunda femoral, popliteal and calf veins including the posterior tibial, peroneal and gastrocnemius veins when visible. The  superficial great saphenous vein was also interrogated. Spectral Doppler was utilized to evaluate flow at rest and with distal augmentation maneuvers in the common femoral, femoral and popliteal veins. COMPARISON:  None. FINDINGS: RIGHT LOWER EXTREMITY Common Femoral Vein: No evidence of thrombus. Normal compressibility, respiratory phasicity and response to augmentation. Saphenofemoral Junction: No evidence of thrombus. Normal compressibility and flow on color Doppler imaging. Profunda Femoral Vein: No evidence of thrombus. Normal compressibility and flow on color Doppler imaging. Femoral Vein: No evidence of thrombus. Normal compressibility, respiratory phasicity and response to augmentation. Popliteal Vein: No evidence of thrombus. Normal compressibility, respiratory phasicity and response to augmentation. Calf Veins: No evidence of thrombus. Normal compressibility and flow on color Doppler imaging. Superficial Great Saphenous Vein: No evidence of thrombus. Normal compressibility and flow on color Doppler imaging. Other Findings:  Lower extremity edema LEFT LOWER EXTREMITY Common Femoral Vein: No evidence of thrombus. Normal compressibility, respiratory phasicity and response to augmentation. Saphenofemoral Junction: No evidence of thrombus. Normal compressibility and flow on color Doppler imaging. Profunda Femoral Vein: No evidence of thrombus. Normal compressibility and flow on color Doppler imaging. Femoral Vein: No evidence of thrombus. Normal compressibility, respiratory phasicity and response to augmentation. Popliteal Vein: No evidence of thrombus. Normal compressibility, respiratory phasicity and response to augmentation. Calf Veins: No evidence of thrombus. Normal compressibility and flow on color Doppler imaging. Superficial Great Saphenous Vein: No evidence of thrombus. Normal compressibility and flow on color Doppler imaging. Other Findings:  Lower extremity edema IMPRESSION: Sonographic survey of the  bilateral lower extremities negative for DVT Bilateral lower extremity edema Electronically Signed   By: Gilmer Mor D.O.   On: 02/28/2018 16:16    EKG: Independently reviewed.  Sinus tachycardia with no acute changes Old chart reviewed Case discussed with Dr. Gustavus Messing in the ED Chest x-ray reviewed no edema or infiltrate  Assessment/Plan 67 year old male with acute hypoxic respiratory failure likely due to new onset congestive heart failure  Principal Problem:   CHF (congestive heart failure) (HCC)-new onset possible.  Unclear if patient really has chronic lung disease with peripheral edema or actually underlying congestive heart failure which is highly likely to be diastolic dysfunction.  Cardiac echocardiogram was done today which is pending results.  He has chronic scarring in his right upper lobe and no indication for pneumonia at this point time with no fevers and a normal white count.  His bilateral lower extremity ultrasounds was negative for DVT.  Will place on CHF pathway.  Will place on Lasix 40 mg IV every 12 hours and see how he responds clinically to diuresis.  Will need to follow-up on echocardiogram results.  Serial troponin.  Continue oxygen supplementation at 3 L and wean as tolerates.  If patient hypoxia does not resolve with diuresis would consider a chronic component of his hypoxia and the need for home oxygen  therapy.  I will also check a d-dimer and if this is positive we will proceed with CTA to rule out PE   Active Problems:   Essential hypertension, benign-start hydralazine   COPD GOLD II-continue home albuterol and Symbicort Ex-smoker-noted     DVT prophylaxis: SCDs Code Status: Full Family Communication: Wife Disposition Plan: Per day team Consults called: None Admission status: Admission   Tamon,Shanicka Oldenkamp A MD [facility]  If 7PM-7AM, please contact night-coverage [email or web address] Password [facility]  02/28/2018, 9:26 PM

## 2018-02-28 NOTE — ED Triage Notes (Signed)
Pt sent to get ECHO from PCP. ECHO tech brought pt over here due to sats ra 78% and RVSP . Pt a/o. Nad.

## 2018-02-28 NOTE — ED Notes (Signed)
Date and time results received: 02/28/18 8:52 PM   Test: Troponin Critical Value: 0.03  Name of Provider Notified:Zackwoski  Orders Received? Or Actions Taken? No new orders

## 2018-02-28 NOTE — ED Notes (Signed)
Pt palced on 3L [place] 02

## 2018-02-28 NOTE — ED Provider Notes (Signed)
[facility] EMERGENCY DEPARTMENT Provider Note   CSN: [id number] Arrival date & time: 02/28/18  1803     History   Chief Complaint Chief Complaint  Patient presents with  . Shortness of Breath    HPI Zachary [name] is a 67 y.o. male.  Patient sent over from echocardiogram for low oxygen saturations.  Oxygen saturation there on room air was 78%.  Patient has a known history of emphysema.  Also has a history of a right upper lobe chronic consolidation.  It has been followed.  Initially concerning for neoplastic process but that has been ruled out.  Recent CT done by cardiothoracic surgery as part of the follow-up in January showed no significant changes.  Patient states that he has had shortness of breath probably for 2 months worse in the last 2 weeks.  Went to his primary care doctor today.  In the office note documents an oxygen saturation of 81%.  Patient was sent here not to the emergency department for outpatient studies to include the echocardiogram and Doppler studies of the lower extremities.  Doppler studies of the lower extremity were negative.  And as stated above when he went to the echocardiogram they did complete it they realized that his oxygen sats were low and he was sent here.  Upon arrival here patient placed on 4 L of oxygen which got his oxygen saturations up above 95%.  Patient denies any chest pain.  Does admit to bilateral lower extremity swelling.  That has been ongoing for a while.  Patient does not use albuterol at home.  Occasionally is on prednisone.  Patient denies any upper respiratory symptoms or infection.      Past Medical History:  Diagnosis Date  . Emphysema of lung (HCC) 01/30/2017  . GERD (gastroesophageal reflux disease)   . Hypertension     Patient Active Problem List   Diagnosis Date Noted  . CHF (congestive heart failure) (HCC) 02/28/2018  . Emphysema of lung (HCC) 01/30/2017  . Cigarette smoker 11/29/2016  . COPD GOLD II 11/28/2016  .  Pulmonary infiltrates 11/28/2016  . Essential hypertension, benign 01/27/2015  . Gastroesophageal reflux disease with esophagitis 01/27/2015    Past Surgical History:  Procedure Laterality Date  . VIDEO BRONCHOSCOPY Bilateral 12/07/2016   Procedure: VIDEO BRONCHOSCOPY WITH FLUORO;  Surgeon: Nyoka Cowden, MD;  Location: [facility];  Service: Cardiopulmonary;  Laterality: Bilateral;       Home Medications    Prior to Admission medications   Medication Sig Start Date End Date Taking? Authorizing Provider  albuterol (PROVENTIL HFA;VENTOLIN HFA) 108 (90 Base) MCG/ACT inhaler Inhale 2 puffs into the lungs every 6 (six) hours as needed for wheezing or shortness of breath. 02/21/18  Yes Dettinger, Elige Radon, MD  budesonide-formoterol (SYMBICORT) 160-4.5 MCG/ACT inhaler Inhale 2 puffs into the lungs 2 (two) times daily. 02/21/18  Yes Dettinger, Elige Radon, MD  hydrochlorothiazide (MICROZIDE) 12.5 MG capsule Take 1 capsule (12.5 mg total) daily by mouth. 11/03/17  Yes Dettinger, Elige Radon, MD  hydroxypropyl methylcellulose / hypromellose (ISOPTO TEARS / GONIOVISC) 2.5 % ophthalmic solution Place 1 drop into both eyes as needed for dry eyes.   Yes [provider]  Naproxen Sod-Diphenhydramine (ALEVE PM) 220-25 MG TABS Take 0.5 tablets by mouth at bedtime as needed (sleep).   Yes [provider]  omeprazole (PRILOSEC) 40 MG capsule TAKE 1 CAPSULE (40 MG TOTAL) BY MOUTH DAILY. 11/03/17  Yes Dettinger, Elige Radon, MD  predniSONE (DELTASONE) 20 MG tablet  Take 3 tabs daily for 1 week, then 2 tabs daily for week 2, then 1 tab daily for week 3. Patient taking differently: Take 20 mg by mouth daily as needed (for appetite).  02/21/18  Yes Dettinger, Elige Radon, MD    Family History Family History  Problem Relation Age of Onset  . Heart disease Father     Social History Social History   Tobacco Use  . Smoking status: Former Smoker    Packs/day: 0.10    Types: Cigarettes    Start date:  01/27/1974    Last attempt to quit: 05/19/2017    Years since quitting: 0.7  . Smokeless tobacco: Never Used  Substance Use Topics  . Alcohol use: No  . Drug use: No     Allergies   Aspirin   Review of Systems Review of Systems  Constitutional: Negative for fever.  HENT: Negative for congestion.   Eyes: Negative for redness.  Respiratory: Positive for shortness of breath.   Cardiovascular: Positive for leg swelling. Negative for chest pain.  Gastrointestinal: Negative for abdominal pain.  Genitourinary: Negative for dysuria.  Musculoskeletal: Negative for myalgias.  Skin: Negative for rash.  Neurological: Negative for syncope and headaches.  Hematological: Does not bruise/bleed easily.  Psychiatric/Behavioral: Negative for confusion.     Physical Exam Updated Vital Signs BP (!) 130/104   Pulse (!) 110   Temp 97.7 F (36.5 C)   Resp (!) 21   SpO2 93%   Physical Exam  Constitutional: He is oriented to person, place, and time. He appears well-developed and well-nourished. No distress.  HENT:  Head: Normocephalic and atraumatic.  Mouth/Throat: Oropharynx is clear and moist.  Eyes: Conjunctivae and EOM are normal. Pupils are equal, round, and reactive to light.  Neck: Neck supple.  Cardiovascular: Normal rate, regular rhythm and normal heart sounds.  Pulmonary/Chest: Effort normal and breath sounds normal. No respiratory distress. He has no wheezes.  Abdominal: Soft. Bowel sounds are normal. There is no tenderness.  Musculoskeletal: He exhibits edema. He exhibits no tenderness.  Significant bilateral lower extremity edema.  Neurological: He is alert and oriented to person, place, and time. No cranial nerve deficit or sensory deficit. He exhibits normal muscle tone. Coordination normal.  Skin: Skin is warm.  Nursing note and vitals reviewed.    ED Treatments / Results  Labs (all labs ordered are listed, but only abnormal results are displayed) Labs Reviewed  BASIC  METABOLIC PANEL - Abnormal; Notable for the following components:      Result Value   BUN 29 (*)    Creatinine, Ser 1.33 (*)    GFR calc non Af Amer 54 (*)    All other components within normal limits  TROPONIN I - Abnormal; Notable for the following components:   Troponin I 0.03 (*)    All other components within normal limits  BRAIN NATRIURETIC PEPTIDE - Abnormal; Notable for the following components:   B Natriuretic Peptide 421.0 (*)    All other components within normal limits  CBC WITH DIFFERENTIAL/PLATELET    EKG  EKG Interpretation None      ED ECG REPORT   Date: 02/28/2018  Rate: 106  Rhythm: sinus tachycardia  QRS Axis: normal  Intervals: normal  ST/T Wave abnormalities: nonspecific ST/T changes  Conduction Disutrbances:nonspecific intraventricular conduction delay  Narrative Interpretation:   Old EKG Reviewed: none available  I have personally reviewed the EKG tracing and agree with the computerized printout as noted. Additional notes on EKG consider  bilateral atrial enlargement.  Right ventricular hypertrophy with secondary repolarization abnormality.  EKG did not crossover from MUSE.  Radiology Dg Chest 2 View  Result Date: 02/28/2018 CLINICAL DATA:  Shortness of breath.  Pneumonia.  Emphysema. EXAM: CHEST - 2 VIEW COMPARISON:  Most recent chest 02/21/2018. FINDINGS: Normal heart size. Pleuroparenchymal disease in the peripheral RIGHT upper lobe with volume loss and retraction of the RIGHT hilum appears stable. No definite acute infiltrate. Chronic interstitial prominence. Emphysema. No osseous findings. IMPRESSION: COPD.  Stable pleuroparenchymal scarring. Electronically Signed   By: Elsie Stain M.D.   On: 02/28/2018 18:52   US Venous Img Lower Bilateral  Result Date: 02/28/2018 CLINICAL DATA:  67 year old male with a history of shortness of breath and swelling EXAM: BILATERAL LOWER EXTREMITY VENOUS DOPPLER ULTRASOUND TECHNIQUE: Gray-scale sonography with  graded compression, as well as color Doppler and duplex ultrasound were performed to evaluate the lower extremity deep venous systems from the level of the common femoral vein and including the common femoral, femoral, profunda femoral, popliteal and calf veins including the posterior tibial, peroneal and gastrocnemius veins when visible. The superficial great saphenous vein was also interrogated. Spectral Doppler was utilized to evaluate flow at rest and with distal augmentation maneuvers in the common femoral, femoral and popliteal veins. COMPARISON:  None. FINDINGS: RIGHT LOWER EXTREMITY Common Femoral Vein: No evidence of thrombus. Normal compressibility, respiratory phasicity and response to augmentation. Saphenofemoral Junction: No evidence of thrombus. Normal compressibility and flow on color Doppler imaging. Profunda Femoral Vein: No evidence of thrombus. Normal compressibility and flow on color Doppler imaging. Femoral Vein: No evidence of thrombus. Normal compressibility, respiratory phasicity and response to augmentation. Popliteal Vein: No evidence of thrombus. Normal compressibility, respiratory phasicity and response to augmentation. Calf Veins: No evidence of thrombus. Normal compressibility and flow on color Doppler imaging. Superficial Great Saphenous Vein: No evidence of thrombus. Normal compressibility and flow on color Doppler imaging. Other Findings:  Lower extremity edema LEFT LOWER EXTREMITY Common Femoral Vein: No evidence of thrombus. Normal compressibility, respiratory phasicity and response to augmentation. Saphenofemoral Junction: No evidence of thrombus. Normal compressibility and flow on color Doppler imaging. Profunda Femoral Vein: No evidence of thrombus. Normal compressibility and flow on color Doppler imaging. Femoral Vein: No evidence of thrombus. Normal compressibility, respiratory phasicity and response to augmentation. Popliteal Vein: No evidence of thrombus. Normal  compressibility, respiratory phasicity and response to augmentation. Calf Veins: No evidence of thrombus. Normal compressibility and flow on color Doppler imaging. Superficial Great Saphenous Vein: No evidence of thrombus. Normal compressibility and flow on color Doppler imaging. Other Findings:  Lower extremity edema IMPRESSION: Sonographic survey of the bilateral lower extremities negative for DVT Bilateral lower extremity edema Electronically Signed   By: Gilmer Mor D.O.   On: 02/28/2018 16:16    Procedures Procedures (including critical care time)  Medications Ordered in ED Medications  ipratropium-albuterol (DUONEB) 0.5-2.5 (3) MG/3ML nebulizer solution 3 mL (3 mLs Nebulization Given 02/28/18 2044)  methylPREDNISolone sodium succinate (SOLU-MEDROL) 125 mg/2 mL injection 125 mg (125 mg Intravenous Given 02/28/18 2037)     Initial Impression / Assessment and Plan / ED Course  I have reviewed the triage vital signs and the nursing notes.  Pertinent labs & imaging results that were available during my care of the patient were reviewed by me and considered in my medical decision making (see chart for details).    Turning patient's oxygen off here he quickly desats down to 85%.  Patient on 3 L sats around  92% at rest.  So he was left there.  Patient does not have home oxygen at home.  Patient's echocardiogram results not known Doppler studies of both lower extremities are without any abnormalities no evidence of DVT.  Chest x-ray here shows no significant change in the right upper lobe consolidation.  Show significant evidence of emphysema.  Troponin borderline BNP in the 400 range.  Clinically suspicious for possible congestive heart failure but no obvious evidence of pulmonary edema on chest x-ray.  Also could be combination of an exacerbation of emphysema but no wheezing.  Patient may have required home oxygen now for a period of time and is gone unrecognized.  Discussed with hospitalist  they will admit to continue to do serial troponins.  Echocardiogram results will be important.  Patient most likely will require home oxygen.  Patient nontoxic no acute distress.  Patient given Solu-Medrol here patient also given a albuterol Atrovent nebulizer no real change.   Final Clinical Impressions(s) / ED Diagnoses   Final diagnoses:  COPD exacerbation (HCC)  SOB (shortness of breath)  Hypoxia  Bilateral leg edema    ED Discharge Orders    None       Vanetta Mulders, MD 02/28/18 2139

## 2018-02-28 NOTE — Progress Notes (Addendum)
Spoke with Dr. Darrol Poke office Baxter Hire, RN) to inform about the echocardiogram. O2 sat 79-80 % on room air. Spoke with patient and his wife, told them I thought he should go to the ED to be checked out, as it could be very serious. Patient and his wife declined to go to the ED. Left to return home, will check with his physician in a.m. He said.   Celesta Gentile, RCS

## 2018-02-28 NOTE — Progress Notes (Signed)
BP (!) 137/94   Pulse (!) 122   Temp (!) 96.8 F (36 C) (Oral)   Ht 5' 9"  (1.753 m)   Wt 154 lb 9.6 oz (70.1 kg)   SpO2 (!) 81%   BMI 22.83 kg/m    Subjective:    Patient ID: Zachary [name], male    DOB: 12-30-1950, 67 y.o.   MRN: [id number]  HPI: Zachary [name] is a 67 y.o. male presenting on 02/28/2018 for Edema (i week follow up- bilaterl legs)   HPI Shortness of breath and leg swelling Patient is coming with complaints of shortness of breath and leg swelling that is been persistent.  We have done labs and chest x-ray and have not found the answers for any these.  He had mild swelling before the swelling is significantly worsened over the past couple weeks.  He says it is slightly more on the left leg than the right leg.  He says is been more short of breath on exertion and going places that he used to walk easily now he gets winded and has to stop for a second.  He even gets short of breath on short distances like walking half a block.  He denies any fevers or chills.  His oxygen saturation here in the office is 81% and his pulse is 122 today in the office.  Relevant past medical, surgical, family and social history reviewed and updated as indicated. Interim medical history since our last visit reviewed. Allergies and medications reviewed and updated.  Review of Systems  Constitutional: Negative for chills and fever.  HENT: Negative for congestion.   Respiratory: Positive for shortness of breath. Negative for cough, chest tightness and wheezing.   Cardiovascular: Positive for leg swelling. Negative for chest pain and palpitations.  Musculoskeletal: Negative for back pain and gait problem.  Skin: Negative for rash.  Neurological: Negative for dizziness, weakness, light-headedness and numbness.  All other systems reviewed and are negative.  Per HPI unless specifically indicated above  Allergies as of 02/28/2018      Reactions   Aspirin Other (See Comments)   Stomach  burning       Medication List        Accurate as of 02/28/18  1:52 PM. Always use your most recent med list.          albuterol 108 (90 Base) MCG/ACT inhaler Commonly known as:  PROVENTIL HFA;VENTOLIN HFA Inhale 2 puffs into the lungs every 6 (six) hours as needed for wheezing or shortness of breath.   ALEVE PM 220-25 MG Tabs Generic drug:  Naproxen Sod-diphenhydrAMINE Take 0.5 tablets by mouth at bedtime as needed (sleep).   budesonide-formoterol 160-4.5 MCG/ACT inhaler Commonly known as:  SYMBICORT Inhale 2 puffs into the lungs 2 (two) times daily.   hydrochlorothiazide 12.5 MG capsule Commonly known as:  MICROZIDE Take 1 capsule (12.5 mg total) daily by mouth.   omeprazole 40 MG capsule Commonly known as:  PRILOSEC TAKE 1 CAPSULE (40 MG TOTAL) BY MOUTH DAILY.   predniSONE 20 MG tablet Commonly known as:  DELTASONE Take 3 tabs daily for 1 week, then 2 tabs daily for week 2, then 1 tab daily for week 3.          Objective:    BP (!) 137/94   Pulse (!) 122   Temp (!) 96.8 F (36 C) (Oral)   Ht 5' 9"  (1.753 m)   Wt 154 lb 9.6 oz (70.1 kg)   SpO2 Marland Kitchen)  81%   BMI 22.83 kg/m   Wt Readings from Last 3 Encounters:  02/28/18 154 lb 9.6 oz (70.1 kg)  02/21/18 155 lb (70.3 kg)  02/08/18 146 lb (66.2 kg)    Physical Exam  Constitutional: He is oriented to person, place, and time. He appears well-developed and well-nourished. No distress.  Eyes: Conjunctivae are normal. No scleral icterus.  Cardiovascular: Normal rate, regular rhythm, normal heart sounds and intact distal pulses.  No murmur heard. Pulmonary/Chest: Effort normal and breath sounds normal. No respiratory distress. He has no wheezes. He has no rales. He exhibits no tenderness.  Musculoskeletal: Normal range of motion. He exhibits edema (3+ pitting edema in bilateral lower extremities).  Neurological: He is alert and oriented to person, place, and time. Coordination normal.  Skin: Skin is warm and dry. No  rash noted. He is not diaphoretic.  Psychiatric: He has a normal mood and affect. His behavior is normal.  Nursing note and vitals reviewed.   Results for orders placed or performed in visit on 02/21/18  CBC with Differential/Platelet  Result Value Ref Range   WBC 6.4 3.4 - 10.8 x10E3/uL   RBC 5.06 4.14 - 5.80 x10E6/uL   Hemoglobin 14.2 13.0 - 17.7 g/dL   Hematocrit 44.0 37.5 - 51.0 %   MCV 87 79 - 97 fL   MCH 28.1 26.6 - 33.0 pg   MCHC 32.3 31.5 - 35.7 g/dL   RDW 16.2 (H) 12.3 - 15.4 %   Platelets 251 150 - 379 x10E3/uL   Neutrophils 56 Not Estab. %   Lymphs 30 Not Estab. %   Monocytes 11 Not Estab. %   Eos 3 Not Estab. %   Basos 0 Not Estab. %   Neutrophils Absolute 3.6 1.4 - 7.0 x10E3/uL   Lymphocytes Absolute 1.9 0.7 - 3.1 x10E3/uL   Monocytes Absolute 0.7 0.1 - 0.9 x10E3/uL   EOS (ABSOLUTE) 0.2 0.0 - 0.4 x10E3/uL   Basophils Absolute 0.0 0.0 - 0.2 x10E3/uL   Immature Granulocytes 0 Not Estab. %   Immature Grans (Abs) 0.0 0.0 - 0.1 x10E3/uL  CMP14+EGFR  Result Value Ref Range   Glucose 78 65 - 99 mg/dL   BUN 22 8 - 27 mg/dL   Creatinine, Ser 1.18 0.76 - 1.27 mg/dL   GFR calc non Af Amer 63 >59 mL/min/1.73   GFR calc Af Amer 73 >59 mL/min/1.73   BUN/Creatinine Ratio 19 10 - 24   Sodium 142 134 - 144 mmol/L   Potassium 4.9 3.5 - 5.2 mmol/L   Chloride 103 96 - 106 mmol/L   CO2 24 20 - 29 mmol/L   Calcium 9.0 8.6 - 10.2 mg/dL   Total Protein 7.2 6.0 - 8.5 g/dL   Albumin 3.5 (L) 3.6 - 4.8 g/dL   Globulin, Total 3.7 1.5 - 4.5 g/dL   Albumin/Globulin Ratio 0.9 (L) 1.2 - 2.2   Bilirubin Total 0.6 0.0 - 1.2 mg/dL   Alkaline Phosphatase 80 39 - 117 IU/L   AST 26 0 - 40 IU/L   ALT 34 0 - 44 IU/L  TSH  Result Value Ref Range   TSH 1.440 0.450 - 4.500 uIU/mL   Chest x-ray from previous exam was normal    Assessment & Plan:   Problem List Items Addressed This Visit      Respiratory   COPD GOLD II    Other Visit Diagnoses    Peripheral edema    -  Primary    Relevant Orders  US Venous Img Lower Bilateral   ECHOCARDIOGRAM COMPLETE   Oxygen desaturation       Relevant Orders   US Venous Img Lower Bilateral   ECHOCARDIOGRAM COMPLETE       Follow up plan: Return if symptoms worsen or fail to improve.  Counseling provided for all of the vaccine components Orders Placed This Encounter  Procedures  . US Venous Img Lower Bilateral  . ECHOCARDIOGRAM COMPLETE    Caryl Pina, MD [facility] 02/28/2018, 1:52 PM

## 2018-02-28 NOTE — Progress Notes (Signed)
*  PRELIMINARY RESULTS* Echocardiogram Patient and his wife came back just as I was leaving. Patient taken to the ED to be evaluated.  Stacey Drain 02/28/2018, 6:12 PM

## 2018-02-28 NOTE — ED Notes (Signed)
Pt placed on oxygen [place] at 3 liters .  sats in the lower 80's.

## 2018-03-01 ENCOUNTER — Other Ambulatory Visit: Payer: Self-pay

## 2018-03-01 ENCOUNTER — Encounter (HOSPITAL_COMMUNITY): Payer: Self-pay

## 2018-03-01 ENCOUNTER — Inpatient Hospital Stay (HOSPITAL_COMMUNITY): Payer: Medicare Other

## 2018-03-01 DIAGNOSIS — I429 Cardiomyopathy, unspecified: Secondary | ICD-10-CM

## 2018-03-01 DIAGNOSIS — J449 Chronic obstructive pulmonary disease, unspecified: Secondary | ICD-10-CM

## 2018-03-01 DIAGNOSIS — N183 Chronic kidney disease, stage 3 unspecified: Secondary | ICD-10-CM

## 2018-03-01 DIAGNOSIS — I5021 Acute systolic (congestive) heart failure: Secondary | ICD-10-CM

## 2018-03-01 DIAGNOSIS — J9601 Acute respiratory failure with hypoxia: Secondary | ICD-10-CM

## 2018-03-01 DIAGNOSIS — I2781 Cor pulmonale (chronic): Secondary | ICD-10-CM

## 2018-03-01 DIAGNOSIS — I10 Essential (primary) hypertension: Secondary | ICD-10-CM

## 2018-03-01 DIAGNOSIS — I2609 Other pulmonary embolism with acute cor pulmonale: Secondary | ICD-10-CM

## 2018-03-01 LAB — BASIC METABOLIC PANEL
Anion gap: 9 (ref 5–15)
BUN: 26 mg/dL — ABNORMAL HIGH (ref 6–20)
CO2: 25 mmol/L (ref 22–32)
CREATININE: 1.21 mg/dL (ref 0.61–1.24)
Calcium: 8.9 mg/dL (ref 8.9–10.3)
Chloride: 104 mmol/L (ref 101–111)
GFR calc non Af Amer: >60 mL/min (ref >60)
Glucose, Bld: 123 mg/dL — ABNORMAL HIGH (ref 65–99)
Potassium: 4 mmol/L (ref 3.5–5.1)
SODIUM: 138 mmol/L (ref 135–145)

## 2018-03-01 LAB — TROPONIN I

## 2018-03-01 MED ORDER — IOPAMIDOL (ISOVUE-370) INJECTION 76%
100.0000 mL | Freq: Once | INTRAVENOUS | Status: AC | PRN
  Administered 2018-03-01: 100 mL via INTRAVENOUS

## 2018-03-01 MED ORDER — ORAL CARE MOUTH RINSE
15.0000 mL | Freq: Two times a day (BID) | OROMUCOSAL | Status: DC
  Administered 2018-03-01 – 2018-03-06 (×7): 15 mL via OROMUCOSAL

## 2018-03-01 NOTE — Evaluation (Signed)
Physical Therapy Evaluation Patient Details Name: HYDER BACAK MRN: [id number] DOB: [birth year] Today's Date: 03/01/2018   History of Present Illness  RAYSHAD BIDDIX is a 67 y.o. male with medical history significant of COPD, hypertension, chronic right upper lobe infiltrate since pneumonia back in 2017 comes in with about a month of worsening shortness of breath and 2 weeks of bilateral lower extremity swelling.  Patient reports no fevers and no cough.  He saw his primary care physician today at which point he was tachycardic and his oxygen sats were around 80%.  He was sent for further workup with outpatient bilateral lower extremity ultrasound to rule out DVT which was negative and a cardiac echocardiogram which has been done but results are pending.  Patient does not have a prior history of congestive heart failure.  Patient today is indeed hypoxic in the emergency department as he was sent here from outpatient radiology department for hypoxia.  He denies any chest pain.  He has no risk factors for pulmonary emboli.  He does have chronic lung disease but does not require home oxygen supplementation.  Patient is being referred for admission for acute hypoxic respiratory failure of unclear etiology.    Clinical Impression  Patient appears to be functioning at baseline, but out of bed and gait held due to awaiting results for chest X-ray to r/o PE.  Plan: await results of chest X-ray to r/o PE, then attempt out of bed and gait.    Follow Up Recommendations No PT follow up    Equipment Recommendations  None recommended by PT    Recommendations for Other Services       Precautions / Restrictions Precautions Precautions: None Restrictions Weight Bearing Restrictions: No      Mobility  Bed Mobility Overal bed mobility: Independent                Transfers                    Ambulation/Gait                Stairs            Wheelchair Mobility     Modified Rankin (Stroke Patients Only)       Balance                                             Pertinent Vitals/Pain Pain Assessment: No/denies pain    Home Living Family/patient expects to be discharged to:: Private residence Living Arrangements: Spouse/significant other Available Help at Discharge: Family Type of Home: House Home Access: Stairs to enter Entrance Stairs-Rails: None Secretary/administrator of Steps: 2 Home Layout: Two level Home Equipment: None      Prior Function Level of Independence: Independent               Hand Dominance        Extremity/Trunk Assessment   Upper Extremity Assessment Upper Extremity Assessment: Overall WFL for tasks assessed    Lower Extremity Assessment Lower Extremity Assessment: Overall WFL for tasks assessed    Cervical / Trunk Assessment Cervical / Trunk Assessment: Normal  Communication   Communication: No difficulties  Cognition Arousal/Alertness: Awake/alert Behavior During Therapy: WFL for tasks assessed/performed Overall Cognitive Status: Within Functional Limits for tasks assessed  General Comments      Exercises     Assessment/Plan    PT Assessment Patient needs continued PT services  PT Problem List Cardiopulmonary status limiting activity       PT Treatment Interventions Gait training;Stair training;Functional mobility training;Therapeutic activities;Therapeutic exercise;Patient/family education    PT Goals (Current goals can be found in the Care Plan section)  Acute Rehab PT Goals Patient Stated Goal: return home PT Goal Formulation: With patient Time For Goal Achievement: 03/08/18 Potential to Achieve Goals: Good    Frequency Min 3X/week   Barriers to discharge        Co-evaluation               AM-PAC PT "6 Clicks" Daily Activity  Outcome Measure Difficulty turning over in bed (including  adjusting bedclothes, sheets and blankets)?: None Difficulty moving from lying on back to sitting on the side of the bed? : None Difficulty sitting down on and standing up from a chair with arms (e.g., wheelchair, bedside commode, etc,.)?: Unable Help needed moving to and from a bed to chair (including a wheelchair)?: Total Help needed walking in hospital room?: Total Help needed climbing 3-5 steps with a railing? : Total 6 Click Score: 12    End of Session   Activity Tolerance: Patient tolerated treatment well Patient left: in bed;with call bell/phone within reach Nurse Communication: Mobility status PT Visit Diagnosis: Unsteadiness on feet (R26.81);Other abnormalities of gait and mobility (R26.89);Muscle weakness (generalized) (M62.81)    Time: 0865-7846 PT Time Calculation (min) (ACUTE ONLY): 11 min   Charges:   PT Evaluation $PT Eval Moderate Complexity: 1 Mod PT Treatments $Therapeutic Activity: 8-22 mins   PT G Codes:        12:08 PM, 03/03/2018 Ocie Bob, MPT Physical Therapist with [facility] [phone] office [phone] mobile phone

## 2018-03-01 NOTE — Progress Notes (Signed)
CT angio of chest completed. Text-paged Dr. Arbutus Leas to notify results in chart. Earnstine Regal, RN

## 2018-03-01 NOTE — Progress Notes (Signed)
Physical Therapy Treatment Patient Details Name: Zachary [name] MRN: [id number] DOB: 1951-10-29 Today's Date: 03/01/2018    History of Present Illness Zachary [name] is a 67 y.o. male with medical history significant of COPD, hypertension, chronic right upper lobe infiltrate since pneumonia back in 2017 comes in with about a month of worsening shortness of breath and 2 weeks of bilateral lower extremity swelling.  Patient reports no fevers and no cough.  He saw his primary care physician today at which point he was tachycardic and his oxygen sats were around 80%.  He was sent for further workup with outpatient bilateral lower extremity ultrasound to rule out DVT which was negative and a cardiac echocardiogram which has been done but results are pending.  Patient does not have a prior history of congestive heart failure.  Patient today is indeed hypoxic in the emergency department as he was sent here from outpatient radiology department for hypoxia.  He denies any chest pain.  He has no risk factors for pulmonary emboli.  He does have chronic lung disease but does not require home oxygen supplementation.  Patient is being referred for admission for acute hypoxic respiratory failure of unclear etiology.    PT Comments    Patient functioning at baseline for functional mobility and gait, on 4 LPM O2 during gait training with O2 sats dropping to 83%, demonstrates slight SOB, did not require any rest breaks or loss of balance.  Oxygen saturation increased to 87% while seated at bedside on 4 LPM - RN notified.  Plan:  Patient discharged from physical therapy to care of nursing for ambulation daily as tolerated for length of stay.   Follow Up Recommendations  No PT follow up     Equipment Recommendations  None recommended by PT    Recommendations for Other Services       Precautions / Restrictions Precautions Precautions: None Restrictions Weight Bearing Restrictions: No    Mobility  Bed Mobility Overal bed mobility: Independent                Transfers Overall transfer level: Independent Equipment used: None                Ambulation/Gait Ambulation/Gait assistance: Independent Ambulation Distance (Feet): 150 Feet Assistive device: None Gait Pattern/deviations: WFL(Within Functional Limits)   Gait velocity interpretation: at or above normal speed for age/gender     Stairs            Wheelchair Mobility    Modified Rankin (Stroke Patients Only)       Balance Overall balance assessment: No apparent balance deficits (not formally assessed)                                          Cognition Arousal/Alertness: Awake/alert Behavior During Therapy: WFL for tasks assessed/performed Overall Cognitive Status: Within Functional Limits for tasks assessed                                        Exercises      General Comments        Pertinent Vitals/Pain Pain Assessment: No/denies pain    Home Living Family/patient expects to be discharged to:: Private residence Living Arrangements: Spouse/significant other Available Help at Discharge: Family Type of Home: House Home Access: Stairs  to enter Entrance Stairs-Rails: None Home Layout: Two level Home Equipment: None      Prior Function Level of Independence: Independent          PT Goals (current goals can now be found in the care plan section) Acute Rehab PT Goals Patient Stated Goal: return home PT Goal Formulation: With patient/family Time For Goal Achievement: 03-16-2018 Potential to Achieve Goals: Good Progress towards PT goals: Goals met/education completed, patient discharged from PT    Frequency    Min 3X/week      PT Plan Current plan remains appropriate    Co-evaluation              AM-PAC PT "6 Clicks" Daily Activity  Outcome Measure  Difficulty turning over in bed (including adjusting bedclothes, sheets and  blankets)?: None Difficulty moving from lying on back to sitting on the side of the bed? : None Difficulty sitting down on and standing up from a chair with arms (e.g., wheelchair, bedside commode, etc,.)?: None Help needed moving to and from a bed to chair (including a wheelchair)?: None Help needed walking in hospital room?: None Help needed climbing 3-5 steps with a railing? : None 6 Click Score: 24    End of Session Equipment Utilized During Treatment: Oxygen Activity Tolerance: Patient tolerated treatment well Patient left: in bed;with call bell/phone within reach;with family/visitor present Nurse Communication: Mobility status;Other (comment)(RN notified patient can ambulate independently) PT Visit Diagnosis: Unsteadiness on feet (R26.81);Other abnormalities of gait and mobility (R26.89);Muscle weakness (generalized) (M62.81)     Time: 5868-2574 PT Time Calculation (min) (ACUTE ONLY): 14 min  Charges:  $Gait Training: 8-22 mins $Therapeutic Activity: 8-22 mins                    G Codes:       1:53 PM, 16-Mar-2018 Lonell Grandchild, MPT Physical Therapist with [facility] [phone] office [phone] mobile phone

## 2018-03-01 NOTE — Progress Notes (Signed)
Patient to transfer to [facility] for telemetry bed per MD request. Bed request placed as requested by MD. Patient and his wife aware of transfer order. Still awaiting bed placement at [facility]. Text paged MD to notify. Earnstine Regal, RN

## 2018-03-01 NOTE — Progress Notes (Signed)
CTA chest results reviewed -No PE -Contrast reflux into the IVC and hepatic vein consistent with right-sided heart failure -Stable dilated pulmonary artery suggestive of pulmonary hypertension -Moderate to severe emphysema with RUL bronchiectasis -case previously discussed with cardiology, Dr. Lynett Fish to [facility] for R and Left heart cath to be managed by CHF team -Discussed with CHF team who are willing to consult--please call them upon pt arrival -Pt updated on results and plan and agrees with transfer  DTat

## 2018-03-01 NOTE — Progress Notes (Signed)
PROGRESS NOTE  Zachary [name] YKD:[id number] DOB: September 10, 1951 DOA: 02/28/2018 PCP: Dettinger, Elige Radon, MD  Brief History:  67 year old male with a history of COPD, hypertension, GERD presenting with one month history of shortness of breath and worsening lower extremity edema.  The patient has seen his primary care provider a number of times during this period of time.  Approximately 2 weeks ago, the patient was seen by his primary care provider and started on Symbicort, albuterol, prednisone, and azithromycin.  He was noted to have oxygen saturation of 87% on room air in the office at that time.  He followed up in the office on 02/28/2018.  His symptoms did not improve, he was noted to be tachycardic with oxygen saturation in the 70s.  As result, the patient was advised to go to the hospital.  The patient is also complained of orthopnea type symptoms for the past 2-3 weeks.  He has had worsening dyspnea on exertion even walking to the bathroom.  He states that he quit smoking 2 years ago, but occasionally cheats.  He has nearly 100-pack-year history of tobacco.  He has had a nonproductive cough without any fevers, chills, nausea, vomiting, diarrhea, chest pain.  He uses Aleve PM on a nightly basis to help himself sleep.  At the time of admission, the patient was hypoxic with 85% saturation on room air.  Chest x-ray was negative for pulmonary edema but showed chronic interstitial prominence.  Because of JVD and lower extremity edema, the patient was started on intravenous furosemide.  Assessment/Plan: Acute systolic CHF --daily weights -accurate I/O's -fluid restrict -02/28/18 Echo-EF 40-45%, moderate to severe TR, PASP 84, moderate reduction RV function -continue IV Lasix  Cor Pulmonale -CTA chest to r/o PE -diurese as above -may need R-side heart cath -consult cardiology  Acute respiratory failure with hypoxia -Secondary to CHF with underlying COPD -Presently stable on 2 L nasal  cannula -Wean oxygen for saturation greater 92%  COPD -continue Dulera  Essential hypertension -Continue hydralazine -add BB once compensated  CKD stage III -Baseline creatinine 1.1-1.3    Disposition Plan:   Home in 2-3 days  Family Communication:   Spouse updated at bedside 3/14  Consultants:  cardiology  Code Status:  FULL  DVT Prophylaxis:  [place] Lovenox   Procedures: As Listed in Progress Note Above  Antibiotics: None    Subjective: Patient denies fevers, chills, headache, chest pain,  nausea, vomiting, diarrhea, abdominal pain, dysuria, hematuria, hematochezia, and melena.  He continues to have shortness of breath with minimal exertion.   Objective: Vitals:   03/01/18 0030 03/01/18 0130 03/01/18 0234 03/01/18 0437  BP: (!) 132/96 (!) 127/96 (!) 137/97 125/85  Pulse: (!) 106 (!) 101 (!) 105 99  Resp: 20 18 18    Temp:   97.6 F (36.4 C) 98.3 F (36.8 C)  TempSrc:   Oral Oral  SpO2: 94% 95% 94% 99%  Weight:   67.5 kg (148 lb 12.8 oz) 67.5 kg (148 lb 13 oz)  Height:   5\' 9"  (1.753 m)     Intake/Output Summary (Last 24 hours) at 03/01/2018 3976 Last data filed at 02/28/2018 2250 Gross per 24 hour  Intake -  Output 250 ml  Net -250 ml   Weight change:  Exam:   General:  Pt is alert, follows commands appropriately, not in acute distress  HEENT: No icterus, No thrush, No neck mass, [place]/AT  Cardiovascular: RRR, S1/S2, no rubs, no  gallops +JVD  Respiratory: Diminished breath sounds but clear to auscultation.  No wheezing  Abdomen: Soft/+BS, non tender, non distended, no guarding  Extremities: 2+ edema, No lymphangitis, No petechiae, No rashes, no synovitis   Data Reviewed: I have personally reviewed following labs and imaging studies Basic Metabolic Panel: Recent Labs  Lab 02/28/18 1840 03/01/18 0338  NA 144 138  K 4.4 4.0  CL 108 104  CO2 25 25  GLUCOSE 83 123*  BUN 29* 26*  CREATININE 1.33* 1.21  CALCIUM 9.5 8.9   Liver Function  Tests: No results for input(s): AST, ALT, ALKPHOS, BILITOT, PROT, ALBUMIN in the last 168 hours. No results for input(s): LIPASE, AMYLASE in the last 168 hours. No results for input(s): AMMONIA in the last 168 hours. Coagulation Profile: No results for input(s): INR, PROTIME in the last 168 hours. CBC: Recent Labs  Lab 02/28/18 1840  WBC 8.1  NEUTROABS 4.6  HGB 13.7  HCT 43.4  MCV 87.7  PLT 247   Cardiac Enzymes: Recent Labs  Lab 02/28/18 1912 02/28/18 2145 03/01/18 0338  TROPONINI 0.03* <0.03 <0.03   BNP: Invalid input(s): POCBNP CBG: No results for input(s): GLUCAP in the last 168 hours. HbA1C: No results for input(s): HGBA1C in the last 72 hours. Urine analysis: No results found for: COLORURINE, APPEARANCEUR, LABSPEC, PHURINE, GLUCOSEU, HGBUR, BILIRUBINUR, KETONESUR, PROTEINUR, UROBILINOGEN, NITRITE, LEUKOCYTESUR Sepsis Labs: @LABRCNTIP (procalcitonin:4,lacticidven:4) )No results found for this or any previous visit (from the past 240 hour(s)).   Scheduled Meds: . furosemide  40 mg Intravenous Q12H  . hydrALAZINE  25 mg Oral Q8H  . hydrochlorothiazide  12.5 mg Oral Daily  . mouth rinse  15 mL Mouth Rinse BID  . mometasone-formoterol  2 puff Inhalation BID  . pantoprazole  40 mg Oral Daily  . sodium chloride flush  3 mL Intravenous Q12H   Continuous Infusions: . sodium chloride      Procedures/Studies: Dg Chest 2 View  Result Date: 02/28/2018 CLINICAL DATA:  Shortness of breath.  Pneumonia.  Emphysema. EXAM: CHEST - 2 VIEW COMPARISON:  Most recent chest 02/21/2018. FINDINGS: Normal heart size. Pleuroparenchymal disease in the peripheral RIGHT upper lobe with volume loss and retraction of the RIGHT hilum appears stable. No definite acute infiltrate. Chronic interstitial prominence. Emphysema. No osseous findings. IMPRESSION: COPD.  Stable pleuroparenchymal scarring. Electronically Signed   By: Elsie Stain M.D.   On: 02/28/2018 18:52   Dg Chest 2  View  Addendum Date: 02/21/2018   ADDENDUM REPORT: 02/21/2018 11:33 ADDENDUM: The patient's accounts have now been merged and prior CT scan of January 02, 2018 is now available for comparison. This study demonstrates continued stability of right upper lobe pleuroparenchymal disease compared to prior exams. Repeat chest CT is not needed at this time. Electronically Signed   By: Lupita Raider, M.D.   On: 02/21/2018 11:33   Result Date: 02/21/2018 CLINICAL DATA:  Shortness of breath. EXAM: CHEST - 2 VIEW COMPARISON:  None. FINDINGS: The heart size and mediastinal contours are within normal limits. No pneumothorax or pleural effusion is noted. Mild interstitial densities are noted in both lung bases which may represent scarring, but acute superimposed edema cannot be excluded. Significant pleural thickening is seen in the right lung apex which may simply represent scarring, but a large rounded density is seen in this area concerning for possible neoplasm. The visualized skeletal structures are unremarkable. IMPRESSION: Right apical pleural thickening is noted which may simply represent scarring. However, rounded density is seen in right  lung apex as well concerning for possible neoplasm, and CT scan of the chest with intravenous contrast is recommended for further evaluation. These results will be called to the ordering clinician or representative by the Radiologist Assistant, and communication documented in the PACS or zVision Dashboard. Mild bibasilar interstitial densities are noted which may represent scarring, but acute superimposed edema cannot be excluded. Electronically Signed: By: Lupita Raider, M.D. On: 02/21/2018 11:08   US Venous Img Lower Bilateral  Result Date: 02/28/2018 CLINICAL DATA:  67 year old male with a history of shortness of breath and swelling EXAM: BILATERAL LOWER EXTREMITY VENOUS DOPPLER ULTRASOUND TECHNIQUE: Gray-scale sonography with graded compression, as well as color Doppler and  duplex ultrasound were performed to evaluate the lower extremity deep venous systems from the level of the common femoral vein and including the common femoral, femoral, profunda femoral, popliteal and calf veins including the posterior tibial, peroneal and gastrocnemius veins when visible. The superficial great saphenous vein was also interrogated. Spectral Doppler was utilized to evaluate flow at rest and with distal augmentation maneuvers in the common femoral, femoral and popliteal veins. COMPARISON:  None. FINDINGS: RIGHT LOWER EXTREMITY Common Femoral Vein: No evidence of thrombus. Normal compressibility, respiratory phasicity and response to augmentation. Saphenofemoral Junction: No evidence of thrombus. Normal compressibility and flow on color Doppler imaging. Profunda Femoral Vein: No evidence of thrombus. Normal compressibility and flow on color Doppler imaging. Femoral Vein: No evidence of thrombus. Normal compressibility, respiratory phasicity and response to augmentation. Popliteal Vein: No evidence of thrombus. Normal compressibility, respiratory phasicity and response to augmentation. Calf Veins: No evidence of thrombus. Normal compressibility and flow on color Doppler imaging. Superficial Great Saphenous Vein: No evidence of thrombus. Normal compressibility and flow on color Doppler imaging. Other Findings:  Lower extremity edema LEFT LOWER EXTREMITY Common Femoral Vein: No evidence of thrombus. Normal compressibility, respiratory phasicity and response to augmentation. Saphenofemoral Junction: No evidence of thrombus. Normal compressibility and flow on color Doppler imaging. Profunda Femoral Vein: No evidence of thrombus. Normal compressibility and flow on color Doppler imaging. Femoral Vein: No evidence of thrombus. Normal compressibility, respiratory phasicity and response to augmentation. Popliteal Vein: No evidence of thrombus. Normal compressibility, respiratory phasicity and response to  augmentation. Calf Veins: No evidence of thrombus. Normal compressibility and flow on color Doppler imaging. Superficial Great Saphenous Vein: No evidence of thrombus. Normal compressibility and flow on color Doppler imaging. Other Findings:  Lower extremity edema IMPRESSION: Sonographic survey of the bilateral lower extremities negative for DVT Bilateral lower extremity edema Electronically Signed   By: Gilmer Mor D.O.   On: 02/28/2018 16:16    Catarina Hartshorn, DO  [facility] Pager [phone]  If 7PM-7AM, please contact night-coverage [email or web address] Password [email or web address] 03/01/2018, 9:18 AM   LOS: 1 day

## 2018-03-01 NOTE — Consult Note (Signed)
Cardiology Consultation:   Patient ID: Zachary [name]; [id number]; 10-Sep-1951   Admit date: 02/28/2018 Date of Consult: 03/01/2018  Primary Care Provider: Dettinger, Elige Radon, MD Consulting Cardiologist: Dr. Jonelle Sidle   Patient Profile:   Zachary [name] is a 67 y.o. male with a history of severe COPD and previous tobacco abuse, previous occupational asbestos exposure, hypertension, and right upper lobe masslike region demonstrating atypical cells by biopsy but no definitive tumor (followed by Dr. Dorris Fetch) who is being seen today for the evaluation of shortness of breath and abnormal echocardiogram at the request of Dr. Arbutus Leas.  History of Present Illness:   Mr. [name] presents to the hospital after referral from the outpatient echocardiography lab to the ER due to significantly abnormal echocardiogram in the setting of worsening shortness of breath.  Patient reports baseline dyspnea on exertion with known severe COPD as outlined above, states that over the last few weeks he has been more short of breath however and also having bilateral, symmetrical leg edema.  He was referred for an echocardiogram by his PCP, performed yesterday and outlined below.  At the time that the patient underwent his echocardiogram he was tachycardic and hypoxic with oxygen saturation in the 70s.  His echocardiogram demonstrated significant right ventricular dysfunction with PASP estimated 84 mmHg, also significant diastolic and systolic septal flattening, LVEF approximately 40-45%.  With concern for possible thromboembolic disease and pulmonary embolus, he was referred to the ER.  He did undergo lower extremity venous Dopplers which were negative for DVT.  He was admitted to the hospitalist service for further assessment.  Admitting diagnosis was new onset heart failure, he did not undergo further workup for pulmonary embolus.  We will consulted this morning to assist with his workup.  I  reviewed extensive records and updated his chart.  Past Medical History:  Diagnosis Date  . COPD, severe (HCC)   . Essential hypertension   . GERD (gastroesophageal reflux disease)   . History of asbestos exposure   . Mass of upper lobe of right lung    Biopsy shows atypical cells but no definite tumor - followed by Dr. Dorris Fetch    Past Surgical History:  Procedure Laterality Date  . VIDEO BRONCHOSCOPY Bilateral 12/07/2016   Procedure: VIDEO BRONCHOSCOPY WITH FLUORO;  Surgeon: Nyoka Cowden, MD;  Location: [facility];  Service: Cardiopulmonary;  Laterality: Bilateral;     Inpatient Medications: Scheduled Meds: . furosemide  40 mg Intravenous Q12H  . hydrALAZINE  25 mg Oral Q8H  . hydrochlorothiazide  12.5 mg Oral Daily  . mouth rinse  15 mL Mouth Rinse BID  . mometasone-formoterol  2 puff Inhalation BID  . pantoprazole  40 mg Oral Daily  . sodium chloride flush  3 mL Intravenous Q12H   Continuous Infusions: . sodium chloride     PRN Meds: sodium chloride, acetaminophen, albuterol, ondansetron (ZOFRAN) IV, polyvinyl alcohol, sodium chloride flush  Allergies:    Allergies  Allergen Reactions  . Aspirin Other (See Comments)    Stomach burning     Social History:   Social History   Socioeconomic History  . Marital status: Married    Spouse name: Not on file  . Number of children: Not on file  . Years of education: Not on file  . Highest education level: Not on file  Social Needs  . Financial resource strain: Not on file  . Food insecurity - worry: Not on file  . Food insecurity - inability: Not on  file  . Transportation needs - medical: Not on file  . Transportation needs - non-medical: Not on file  Occupational History  . Not on file  Tobacco Use  . Smoking status: Former Smoker    Packs/day: 0.10    Types: Cigarettes    Start date: 01/27/1974    Last attempt to quit: 05/19/2017    Years since quitting: 0.7  . Smokeless tobacco: Never Used  Substance  and Sexual Activity  . Alcohol use: No  . Drug use: No  . Sexual activity: Not on file  Other Topics Concern  . Not on file  Social History Narrative  . Not on file    Family History:   The patient's family history includes Heart disease in his father.  ROS:  Please see the history of present illness.  Chronic dyspnea on exertion, NYHA class II-III, weight loss. All other ROS reviewed and negative.     Physical Exam/Data:   Vitals:   03/01/18 0030 03/01/18 0130 03/01/18 0234 03/01/18 0437  BP: (!) 132/96 (!) 127/96 (!) 137/97 125/85  Pulse: (!) 106 (!) 101 (!) 105 99  Resp: 20 18 18    Temp:   97.6 F (36.4 C) 98.3 F (36.8 C)  TempSrc:   Oral Oral  SpO2: 94% 95% 94% 99%  Weight:   148 lb 12.8 oz (67.5 kg) 148 lb 13 oz (67.5 kg)  Height:   5\' 9"  (1.753 m)     Intake/Output Summary (Last 24 hours) at 03/01/2018 1012 Last data filed at 02/28/2018 2250 Gross per 24 hour  Intake -  Output 250 ml  Net -250 ml   Filed Weights   03/01/18 0234 03/01/18 0437  Weight: 148 lb 12.8 oz (67.5 kg) 148 lb 13 oz (67.5 kg)   Body mass index is 21.98 kg/m.   Gen: Chronically ill-appearing male in no acute distress wearing oxygen via nasal cannula.  HEENT: Conjunctiva and lids normal, oropharynx clear. Neck: Supple, elevated JVP, no carotid bruits. Lungs: Coarse breath sounds with crackles at the bases, no wheezing. Cardiac: Regular rate and rhythm, S4, soft systolic murmur at apex, no pericardial rub. Abdomen: Soft, nontender, bowel sounds present. Extremities: 1-2 lower leg edema, distal pulses 1-2+. Skin: Warm and dry. Musculoskeletal: No kyphosis. Neuropsychiatric: Alert and oriented x3, affect grossly appropriate.  EKG:  I personally reviewed the tracing from 02/28/2018 which showed sinus tachycardia with possible left atrial enlargement and right ventricular hypertrophy.  Telemetry:  I personally reviewed telemetry which shows sinus rhythm and sinus tachycardia with rare  ectopy.  Relevant CV Studies:  Echocardiogram 02/28/2018: Study Conclusions  - Left ventricle: The cavity size was normal. There was mild focal   basal hypertrophy of the septum. Systolic function was mildly to   moderately reduced. The estimated ejection fraction was in the   range of 40% to 45%. LVEF and wall motion difficult to assess in   setting of tachycardia and abnormal septal motion. Possible   hypokinesis of the inferior and apical myocardium. The study is   not technically sufficient to allow evaluation of LV diastolic   function. - Ventricular septum: The contour showed diastolic flattening and   systolic flattening consistent with right ventricular volume and   pressure overload. - Aortic valve: Mildly calcified annulus. Trileaflet. - Mitral valve: There was trivial regurgitation. - Right ventricle: The cavity size was severely dilated. Systolic   function was moderately reduced. Possible McConnell&'s sign,   pulmonary embolus should be considered. - Right atrium:  The atrium was severely dilated. Cannot exclude   thrombi within right atrium, echodensities seen intermittently,   although with image dropout due to respiratory motion. Central   venous pressure (est): 15 mm Hg. - Tricuspid valve: There was moderate-severe regurgitation. - Pulmonary arteries: Systolic pressure was severely increased. PA   peak pressure: 84 mm Hg (S). - Pericardium, extracardiac: A small pericardial effusion was   identified posterior to the heart.  Impressions:  - I was contacted by sonographer with preliminary report after   images were obtained. This information was also given to the   ordering provider's nurse. Patient tachycardic and hypoxic at   time of study. I recommended that patient be taken directly to   the ER from the echocardiography lab for further evaluation and   management. Thromboembolic disease with pulmonary embolus is high   on the differential.  Laboratory  Data:  Chemistry Recent Labs  Lab 02/28/18 1840 03/01/18 0338  NA 144 138  K 4.4 4.0  CL 108 104  CO2 25 25  GLUCOSE 83 123*  BUN 29* 26*  CREATININE 1.33* 1.21  CALCIUM 9.5 8.9  GFRNONAA 54* >60  GFRAA >60 >60  ANIONGAP 11 9    No results for input(s): PROT, ALBUMIN, AST, ALT, ALKPHOS, BILITOT in the last 168 hours. Hematology Recent Labs  Lab 02/28/18 1840  WBC 8.1  RBC 4.95  HGB 13.7  HCT 43.4  MCV 87.7  MCH 27.7  MCHC 31.6  RDW 15.4  PLT 247   Cardiac Enzymes Recent Labs  Lab 02/28/18 1912 02/28/18 2145 03/01/18 0338  TROPONINI 0.03* <0.03 <0.03   No results for input(s): TROPIPOC in the last 168 hours.  BNP Recent Labs  Lab 02/28/18 1912  BNP 421.0*    DDimer  Recent Labs  Lab 02/28/18 1840  DDIMER 0.52*    Radiology/Studies:  Dg Chest 2 View  Result Date: 02/28/2018 CLINICAL DATA:  Shortness of breath.  Pneumonia.  Emphysema. EXAM: CHEST - 2 VIEW COMPARISON:  Most recent chest 02/21/2018. FINDINGS: Normal heart size. Pleuroparenchymal disease in the peripheral RIGHT upper lobe with volume loss and retraction of the RIGHT hilum appears stable. No definite acute infiltrate. Chronic interstitial prominence. Emphysema. No osseous findings. IMPRESSION: COPD.  Stable pleuroparenchymal scarring. Electronically Signed   By: Elsie Stain M.D.   On: 02/28/2018 18:52   US Venous Img Lower Bilateral  Result Date: 02/28/2018 CLINICAL DATA:  67 year old male with a history of shortness of breath and swelling EXAM: BILATERAL LOWER EXTREMITY VENOUS DOPPLER ULTRASOUND TECHNIQUE: Gray-scale sonography with graded compression, as well as color Doppler and duplex ultrasound were performed to evaluate the lower extremity deep venous systems from the level of the common femoral vein and including the common femoral, femoral, profunda femoral, popliteal and calf veins including the posterior tibial, peroneal and gastrocnemius veins when visible. The superficial great  saphenous vein was also interrogated. Spectral Doppler was utilized to evaluate flow at rest and with distal augmentation maneuvers in the common femoral, femoral and popliteal veins. COMPARISON:  None. FINDINGS: RIGHT LOWER EXTREMITY Common Femoral Vein: No evidence of thrombus. Normal compressibility, respiratory phasicity and response to augmentation. Saphenofemoral Junction: No evidence of thrombus. Normal compressibility and flow on color Doppler imaging. Profunda Femoral Vein: No evidence of thrombus. Normal compressibility and flow on color Doppler imaging. Femoral Vein: No evidence of thrombus. Normal compressibility, respiratory phasicity and response to augmentation. Popliteal Vein: No evidence of thrombus. Normal compressibility, respiratory phasicity and response to augmentation. Calf  Veins: No evidence of thrombus. Normal compressibility and flow on color Doppler imaging. Superficial Great Saphenous Vein: No evidence of thrombus. Normal compressibility and flow on color Doppler imaging. Other Findings:  Lower extremity edema LEFT LOWER EXTREMITY Common Femoral Vein: No evidence of thrombus. Normal compressibility, respiratory phasicity and response to augmentation. Saphenofemoral Junction: No evidence of thrombus. Normal compressibility and flow on color Doppler imaging. Profunda Femoral Vein: No evidence of thrombus. Normal compressibility and flow on color Doppler imaging. Femoral Vein: No evidence of thrombus. Normal compressibility, respiratory phasicity and response to augmentation. Popliteal Vein: No evidence of thrombus. Normal compressibility, respiratory phasicity and response to augmentation. Calf Veins: No evidence of thrombus. Normal compressibility and flow on color Doppler imaging. Superficial Great Saphenous Vein: No evidence of thrombus. Normal compressibility and flow on color Doppler imaging. Other Findings:  Lower extremity edema IMPRESSION: Sonographic survey of the bilateral lower  extremities negative for DVT Bilateral lower extremity edema Electronically Signed   By: Gilmer Mor D.O.   On: 02/28/2018 16:16    Assessment and Plan:   1.  Acute hypoxic respiratory failure in patient with severe COPD based on record review, also right upper lobe abnormality with biopsy so far not demonstrating tumor (followed by Dr. Dorris Fetch), previous history of occupational asbestos exposure, prior history of tobacco use.  Patient reports worsening shortness of breath in the last few weeks, also worsening symmetrical leg edema.  Venous Dopplers were negative for DVT.  Echocardiogram done yesterday afternoon demonstrates significant right ventricular dysfunction with severe pulmonary hypertension, PASP 84 mmHg, moderate to severe tricuspid regurgitation, and left ventricular dysfunction with LVEF 40-45% range.  2.  Cardiomyopathy, LVEF 40-45% range in the setting of tachycardia and abnormal septal motion, looks to be inferior hypokinesis as well.  3.  Essential hypertension, on hydrochlorothiazide as an outpatient.  4.  Recent onset bilateral leg edema over the last few weeks, no definite DVT by venous Dopplers, likely secondary to cor pulmonale.  I reviewed extensive records and updated the chart.  Discussed the case with Dr. Arbutus Leas.  As mentioned in my echocardiogram report, would recommend further evaluating for possible pulmonary embolus as cause of right heart failure and severe pulmonary hypertension.  Proceed with chest CTA.  Of note, he did have a screening noncontrasted chest CT in January per Dr. Dorris Fetch for follow-up of right upper lobe abnormality.  If he does not have evidence of pulmonary embolus, he will need further evaluation of his chronic pulmonary disease as it relates to treatment options for pulmonary hypertension and oxygen requirement.  In that case would consider transferring him to [facility] and get formal consultation with Pulmonary and also Heart Failure  team to consider right heart catheterization.   Signed, Nona Dell, MD  03/01/2018 10:12 AM

## 2018-03-02 DIAGNOSIS — I509 Heart failure, unspecified: Secondary | ICD-10-CM

## 2018-03-02 LAB — BASIC METABOLIC PANEL
ANION GAP: 11 (ref 5–15)
BUN: 30 mg/dL — AB (ref 6–20)
CHLORIDE: 96 mmol/L — AB (ref 101–111)
CO2: 30 mmol/L (ref 22–32)
Calcium: 9.1 mg/dL (ref 8.9–10.3)
Creatinine, Ser: 1.43 mg/dL — ABNORMAL HIGH (ref 0.61–1.24)
GFR calc Af Amer: 57 mL/min — ABNORMAL LOW (ref >60)
GFR, EST NON AFRICAN AMERICAN: 49 mL/min — AB (ref >60)
Glucose, Bld: 103 mg/dL — ABNORMAL HIGH (ref 65–99)
POTASSIUM: 3.5 mmol/L (ref 3.5–5.1)
SODIUM: 137 mmol/L (ref 135–145)

## 2018-03-02 LAB — MAGNESIUM: MAGNESIUM: 1.8 mg/dL (ref 1.7–2.4)

## 2018-03-02 MED ORDER — FUROSEMIDE 10 MG/ML IJ SOLN
40.0000 mg | Freq: Every day | INTRAMUSCULAR | Status: DC
  Administered 2018-03-03 – 2018-03-05 (×3): 40 mg via INTRAVENOUS
  Filled 2018-03-02 (×3): qty 4

## 2018-03-02 NOTE — Progress Notes (Addendum)
PROGRESS NOTE  Zachary [name] NFA:[id number] DOB: 1951-08-23 DOA: 02/28/2018 PCP: Dettinger, Elige Radon, MD  Brief History:  67 year old male with a history of COPD, hypertension, GERD presenting with one month history of shortness of breath and worsening lower extremity edema.  The patient has seen his primary care provider a number of times during this period of time.  Approximately 2 weeks ago, the patient was seen by his primary care provider and started on Symbicort, albuterol, prednisone, and azithromycin.  He was noted to have oxygen saturation of 87% on room air in the office at that time.  He followed up in the office on 02/28/2018.  His symptoms did not improve, he was noted to be tachycardic with oxygen saturation in the 70s.  As result, the patient was advised to go to the hospital.  The patient is also complained of orthopnea type symptoms for the past 2-3 weeks.  He has had worsening dyspnea on exertion even walking to the bathroom.  He states that he quit smoking 2 years ago, but occasionally cheats.  He has nearly 100-pack-year history of tobacco.  He has had a nonproductive cough without any fevers, chills, nausea, vomiting, diarrhea, chest pain.  He uses Aleve PM on a nightly basis to help himself sleep.  At the time of admission, the patient was hypoxic with 85% saturation on room air.  Chest x-ray was negative for pulmonary edema but showed chronic interstitial prominence.  Because of JVD and lower extremity edema, the patient was started on intravenous furosemide.  Assessment/Plan: Acute systolic CHF --daily weights -accurate I/O's--NEG 3.8 L but has dumped urine without notifying RN -fluid restrict -02/28/18 Echo-EF 40-45%, moderate to severe TR, PASP 84, moderate reduction RV function -continue IV Lasix--decrease to once daily due to rising serum creatinine  Cor Pulmonale -CTA chest--negative PE; contrast reflux into the hepatic vein and IVC consistent with  right-sided CHF. RUL bronchiectasis -diurese as above -discussed with cardiology, Dr. Lynett Fish to [facility] for CHF team management and need for R&L heart cath -consult cardiology--appreciated -I spoke with Alejandro Mulling, PA-C who agreed for CHF to see pt upon arrival  Acute respiratory failure with hypoxia -Secondary to CHF with underlying COPD -Presently stable on 2 L nasal cannula -Wean oxygen for saturation greater 92%  COPD -continue Dulera  Essential hypertension -Continue hydralazine -add BB once compensated  CKD stage III -Baseline creatinine 1.1-1.3    Disposition Plan:   Transfer to [facility] Family Communication:   Spouse updated at bedside 3/15  Consultants:  cardiology  Code Status:  FULL  DVT Prophylaxis:  [place] Lovenox   Procedures: As Listed in Progress Note Above  Antibiotics: None       Subjective: Patient is breathing better but he remains dyspneic with exertion.  He denies any fevers, chills, chest pain, nausea, vomiting, diarrhea, abdominal pain.  There is no dysuria hematuria.  Objective: Vitals:   03/01/18 1946 03/01/18 2051 03/02/18 0451 03/02/18 0930  BP:  113/80 122/88   Pulse:  (!) 108 98   Resp: 20 20 20    Temp:  98.3 F (36.8 C) (!) 97.5 F (36.4 C)   TempSrc:  Oral Oral   SpO2:  96% 98% 96%  Weight:   68.8 kg (151 lb 10.8 oz)   Height:        Intake/Output Summary (Last 24 hours) at 03/02/2018 1346 Last data filed at 03/02/2018 1259 Gross per 24 hour  Intake 600  ml  Output 3400 ml  Net -2800 ml   Weight change: 1.305 kg (2 lb 14 oz) Exam:   General:  Pt is alert, follows commands appropriately, not in acute distress  HEENT: No icterus, No thrush, No neck mass, [place]/AT  Cardiovascular: RRR, S1/S2, no rubs, no gallops  Respiratory: Bibasilar crackles but no wheezing.  Good air movement.  Abdomen: Soft/+BS, non tender, non distended, no guarding  Extremities: 1+LE edema, No lymphangitis, No  petechiae, No rashes, no synovitis   Data Reviewed: I have personally reviewed following labs and imaging studies Basic Metabolic Panel: Recent Labs  Lab 02/28/18 1840 03/01/18 0338 03/02/18 0726  NA 144 138 137  K 4.4 4.0 3.5  CL 108 104 96*  CO2 25 25 30   GLUCOSE 83 123* 103*  BUN 29* 26* 30*  CREATININE 1.33* 1.21 1.43*  CALCIUM 9.5 8.9 9.1  MG  --   --  1.8   Liver Function Tests: No results for input(s): AST, ALT, ALKPHOS, BILITOT, PROT, ALBUMIN in the last 168 hours. No results for input(s): LIPASE, AMYLASE in the last 168 hours. No results for input(s): AMMONIA in the last 168 hours. Coagulation Profile: No results for input(s): INR, PROTIME in the last 168 hours. CBC: Recent Labs  Lab 02/28/18 1840  WBC 8.1  NEUTROABS 4.6  HGB 13.7  HCT 43.4  MCV 87.7  PLT 247   Cardiac Enzymes: Recent Labs  Lab 02/28/18 1912 02/28/18 2145 03/01/18 0338 03/01/18 0927  TROPONINI 0.03* <0.03 <0.03 <0.03   BNP: Invalid input(s): POCBNP CBG: No results for input(s): GLUCAP in the last 168 hours. HbA1C: No results for input(s): HGBA1C in the last 72 hours. Urine analysis: No results found for: COLORURINE, APPEARANCEUR, LABSPEC, PHURINE, GLUCOSEU, HGBUR, BILIRUBINUR, KETONESUR, PROTEINUR, UROBILINOGEN, NITRITE, LEUKOCYTESUR Sepsis Labs: @LABRCNTIP (procalcitonin:4,lacticidven:4) )No results found for this or any previous visit (from the past 240 hour(s)).   Scheduled Meds: . furosemide  40 mg Intravenous Q12H  . hydrALAZINE  25 mg Oral Q8H  . hydrochlorothiazide  12.5 mg Oral Daily  . mouth rinse  15 mL Mouth Rinse BID  . mometasone-formoterol  2 puff Inhalation BID  . pantoprazole  40 mg Oral Daily  . sodium chloride flush  3 mL Intravenous Q12H   Continuous Infusions: . sodium chloride      Procedures/Studies: Dg Chest 2 View  Result Date: 02/28/2018 CLINICAL DATA:  Shortness of breath.  Pneumonia.  Emphysema. EXAM: CHEST - 2 VIEW COMPARISON:  Most recent  chest 02/21/2018. FINDINGS: Normal heart size. Pleuroparenchymal disease in the peripheral RIGHT upper lobe with volume loss and retraction of the RIGHT hilum appears stable. No definite acute infiltrate. Chronic interstitial prominence. Emphysema. No osseous findings. IMPRESSION: COPD.  Stable pleuroparenchymal scarring. Electronically Signed   By: Elsie Stain M.D.   On: 02/28/2018 18:52   Dg Chest 2 View  Addendum Date: 02/21/2018   ADDENDUM REPORT: 02/21/2018 11:33 ADDENDUM: The patient's accounts have now been merged and prior CT scan of January 02, 2018 is now available for comparison. This study demonstrates continued stability of right upper lobe pleuroparenchymal disease compared to prior exams. Repeat chest CT is not needed at this time. Electronically Signed   By: Lupita Raider, M.D.   On: 02/21/2018 11:33   Result Date: 02/21/2018 CLINICAL DATA:  Shortness of breath. EXAM: CHEST - 2 VIEW COMPARISON:  None. FINDINGS: The heart size and mediastinal contours are within normal limits. No pneumothorax or pleural effusion is noted. Mild interstitial densities  are noted in both lung bases which may represent scarring, but acute superimposed edema cannot be excluded. Significant pleural thickening is seen in the right lung apex which may simply represent scarring, but a large rounded density is seen in this area concerning for possible neoplasm. The visualized skeletal structures are unremarkable. IMPRESSION: Right apical pleural thickening is noted which may simply represent scarring. However, rounded density is seen in right lung apex as well concerning for possible neoplasm, and CT scan of the chest with intravenous contrast is recommended for further evaluation. These results will be called to the ordering clinician or representative by the Radiologist Assistant, and communication documented in the PACS or zVision Dashboard. Mild bibasilar interstitial densities are noted which may represent scarring,  but acute superimposed edema cannot be excluded. Electronically Signed: By: Lupita Raider, M.D. On: 02/21/2018 11:08   Ct Angio Chest Pe W Or Wo Contrast  Result Date: 03/01/2018 CLINICAL DATA:  Inpatient. Dyspnea for 1 month. Lower extremity edema. EXAM: CT ANGIOGRAPHY CHEST WITH CONTRAST TECHNIQUE: Multidetector CT imaging of the chest was performed using the standard protocol during bolus administration of intravenous contrast. Multiplanar CT image reconstructions and MIPs were obtained to evaluate the vascular anatomy. CONTRAST:  ISOVUE-370 IOPAMIDOL (ISOVUE-370) INJECTION 76% COMPARISON:  Chest radiograph from one day prior. 01/02/2018 chest CT. FINDINGS: Cardiovascular: The study is high quality for the evaluation of pulmonary embolism. There are no filling defects in the central, lobar, segmental or subsegmental pulmonary artery branches to suggest acute pulmonary embolism. Thoracic aorta is normal in course and caliber. Dilated main pulmonary artery (3.8 cm diameter), stable. Mild cardiomegaly with dilated right cardiac chambers. No significant pericardial fluid/thickening. Mediastinum/Nodes: No discrete thyroid nodules. Unremarkable esophagus. No pathologically enlarged axillary, mediastinal or hilar lymph nodes. Lungs/Pleura: No pneumothorax. Trace dependent right pleural effusion. No left pleural effusion. Moderate to severe centrilobular and paraseptal emphysema with mild diffuse bronchial wall thickening. No acute consolidative airspace disease or significant pulmonary nodules. Bandlike subpleural focus of consolidation in the peripheral right upper lobe measures up to 8.3 x 2.4 cm (series 6/image 35), not appreciably changed since 01/02/2018 chest CT, associated with significant volume loss, distortion and bronchiectasis. Upper abdomen: Contrast reflux into the IVC and hepatic veins. Musculoskeletal: No aggressive appearing focal osseous lesions. Moderate thoracic spondylosis. Review of the  MIP images confirms the above findings. IMPRESSION: 1. No pulmonary embolism. 2. Mild cardiomegaly with enlarged right cardiac chambers. Contrast reflux into the IVC and hepatic veins. Findings suggest right heart failure. 3. Stable dilated main pulmonary artery, suggesting chronic pulmonary arterial hypertension. 4. Trace dependent right pleural effusion. 5. Stable bandlike subpleural consolidation, volume loss, distortion and bronchiectasis in the peripheral right upper lobe. Morphologically, this finding is most compatible with pleural-parenchymal scarring such as due to chronic granulomatous infection. Continued chest CT surveillance warranted in 6-12 months. 6. Moderate to severe emphysema with mild diffuse bronchial wall thickening, suggesting COPD. Emphysema (ICD10-J43.9). Electronically Signed   By: Delbert Phenix M.D.   On: 03/01/2018 13:02   US Venous Img Lower Bilateral  Result Date: 02/28/2018 CLINICAL DATA:  67 year old male with a history of shortness of breath and swelling EXAM: BILATERAL LOWER EXTREMITY VENOUS DOPPLER ULTRASOUND TECHNIQUE: Gray-scale sonography with graded compression, as well as color Doppler and duplex ultrasound were performed to evaluate the lower extremity deep venous systems from the level of the common femoral vein and including the common femoral, femoral, profunda femoral, popliteal and calf veins including the posterior tibial, peroneal and gastrocnemius veins  when visible. The superficial great saphenous vein was also interrogated. Spectral Doppler was utilized to evaluate flow at rest and with distal augmentation maneuvers in the common femoral, femoral and popliteal veins. COMPARISON:  None. FINDINGS: RIGHT LOWER EXTREMITY Common Femoral Vein: No evidence of thrombus. Normal compressibility, respiratory phasicity and response to augmentation. Saphenofemoral Junction: No evidence of thrombus. Normal compressibility and flow on color Doppler imaging. Profunda Femoral  Vein: No evidence of thrombus. Normal compressibility and flow on color Doppler imaging. Femoral Vein: No evidence of thrombus. Normal compressibility, respiratory phasicity and response to augmentation. Popliteal Vein: No evidence of thrombus. Normal compressibility, respiratory phasicity and response to augmentation. Calf Veins: No evidence of thrombus. Normal compressibility and flow on color Doppler imaging. Superficial Great Saphenous Vein: No evidence of thrombus. Normal compressibility and flow on color Doppler imaging. Other Findings:  Lower extremity edema LEFT LOWER EXTREMITY Common Femoral Vein: No evidence of thrombus. Normal compressibility, respiratory phasicity and response to augmentation. Saphenofemoral Junction: No evidence of thrombus. Normal compressibility and flow on color Doppler imaging. Profunda Femoral Vein: No evidence of thrombus. Normal compressibility and flow on color Doppler imaging. Femoral Vein: No evidence of thrombus. Normal compressibility, respiratory phasicity and response to augmentation. Popliteal Vein: No evidence of thrombus. Normal compressibility, respiratory phasicity and response to augmentation. Calf Veins: No evidence of thrombus. Normal compressibility and flow on color Doppler imaging. Superficial Great Saphenous Vein: No evidence of thrombus. Normal compressibility and flow on color Doppler imaging. Other Findings:  Lower extremity edema IMPRESSION: Sonographic survey of the bilateral lower extremities negative for DVT Bilateral lower extremity edema Electronically Signed   By: Gilmer Mor D.O.   On: 02/28/2018 16:16    Catarina Hartshorn, DO  [facility] Pager [phone]  If 7PM-7AM, please contact night-coverage [email or web address] Password [email or web address] 03/02/2018, 1:46 PM   LOS: 2 days

## 2018-03-02 NOTE — Care Management Important Message (Signed)
Important Message  Patient Details  Name: Zachary [name] MRN: [id number] Date of Birth: Feb 27, 1951   Medicare Important Message Given:  Yes    Malcolm Metro, RN 03/02/2018, 10:07 AM

## 2018-03-03 DIAGNOSIS — N183 Chronic kidney disease, stage 3 (moderate): Secondary | ICD-10-CM

## 2018-03-03 DIAGNOSIS — R600 Localized edema: Secondary | ICD-10-CM

## 2018-03-03 DIAGNOSIS — I5020 Unspecified systolic (congestive) heart failure: Secondary | ICD-10-CM

## 2018-03-03 LAB — BASIC METABOLIC PANEL
ANION GAP: 13 (ref 5–15)
BUN: 27 mg/dL — AB (ref 6–20)
CALCIUM: 8.9 mg/dL (ref 8.9–10.3)
CO2: 30 mmol/L (ref 22–32)
Chloride: 92 mmol/L — ABNORMAL LOW (ref 101–111)
Creatinine, Ser: 1.37 mg/dL — ABNORMAL HIGH (ref 0.61–1.24)
GFR calc Af Amer: >60 mL/min (ref >60)
GFR, EST NON AFRICAN AMERICAN: 52 mL/min — AB (ref >60)
Glucose, Bld: 109 mg/dL — ABNORMAL HIGH (ref 65–99)
POTASSIUM: 3.5 mmol/L (ref 3.5–5.1)
SODIUM: 135 mmol/L (ref 135–145)

## 2018-03-03 MED ORDER — IPRATROPIUM-ALBUTEROL 0.5-2.5 (3) MG/3ML IN SOLN
3.0000 mL | Freq: Two times a day (BID) | RESPIRATORY_TRACT | Status: DC
  Administered 2018-03-04 – 2018-03-05 (×3): 3 mL via RESPIRATORY_TRACT
  Filled 2018-03-03 (×2): qty 3

## 2018-03-03 MED ORDER — IPRATROPIUM-ALBUTEROL 0.5-2.5 (3) MG/3ML IN SOLN
3.0000 mL | Freq: Three times a day (TID) | RESPIRATORY_TRACT | Status: DC
  Administered 2018-03-03: 3 mL via RESPIRATORY_TRACT
  Filled 2018-03-03: qty 3

## 2018-03-03 NOTE — Progress Notes (Signed)
[facility]                                                                              Patient Demographics  Zachary [name], is a 67 y.o. male, DOB - 1951/11/04, ZOX:[id number]  Admit date - 02/28/2018   Admitting Physician Haydee Monica, MD  Outpatient Primary MD for the patient is Dettinger, Elige Radon, MD  Outpatient specialists:   LOS - 3  days   Medical records reviewed and are as summarized below:    Chief Complaint  Patient presents with  . Shortness of Breath       Brief summary   67 year old male with a history of COPD, hypertension, GERD presenting with one month history of shortness of breath and worsening lower extremity edema. 2 weeks ago, the patient was seen by his PCP and started on Symbicort, albuterol, prednisone, and azithromycin. He was noted to have oxygen saturation of 87% on RA in the office. He followed up in the office on 02/28/2018. His symptoms did not improve and was tachycardic with oxygen sats in the 70s.  Patient was recommended to go to the ED.  Complaining of orthopnea, dyspnea on exertion for the last 2-3 weeks.  Smoked heavily, quit 2 years ago with 100-pack-year history of tobacco.  Patient was transferred to [facility] due to CHF, started on IV furosemide.  Assessment & Plan    Principal Problem: Acute systolic CHF (congestive heart failure) (HCC) -Continue IV Lasix for diuresis, patient reports significant improvement since admission -2D echo on 02/28/18 showed EF of 40-45% with moderate to severe TR, PA pressure 84, moderate reduction in the RV function -Cardiology was consulted and patient was transferred to [facility] for possible right heart cath -Negative balance of 5.6 L, weight down from 151-> 139 lbs today -Creatinine trended up to 1.4 on 3/15, follow BMET today -Troponins negative.  Active Problems: Acute respiratory failure with hypoxia likely due to acute systolic CHF, cor pulmonale and  underlying COPD -CT angiogram of the chest negative for PE, right-sided CHF with right upper lung bronchiectasis -Continue O2, scheduled nebs, Dulera    Essential hypertension, benign -Currently stable, continue hydralazine, Lasix   Mild acute on CKD (chronic kidney disease) stage 3, GFR 30-59 ml/min (HCC) -Baseline creatinine 1.1-1.3 -Mildly up on 3/15 secondary to diuresis, follow BMET today   Code Status: Full CODE STATUS DVT Prophylaxis:  scd Family Communication: Discussed in detail with the patient, all imaging results, lab results explained to the patient    Disposition Plan: When cleared by cardiology  Time Spent in minutes   35 minutes  Procedures:  CT angiogram of the chest  Consultants:   Cardiology  Antimicrobials:      Medications  Scheduled Meds: . furosemide  40 mg Intravenous Daily  . hydrALAZINE  25 mg Oral Q8H  . mouth rinse  15 mL Mouth Rinse BID  . mometasone-formoterol  2 puff Inhalation BID  . pantoprazole  40 mg Oral Daily  . sodium chloride flush  3 mL Intravenous Q12H   Continuous Infusions: . sodium chloride     PRN Meds:.sodium chloride, acetaminophen,  albuterol, ondansetron (ZOFRAN) IV, polyvinyl alcohol, sodium chloride flush   Antibiotics   Anti-infectives (From admission, onward)   None        Subjective:   Zachary [name] was seen and examined today.  Per patient, feels a lot better since admission, leg swelling is much improved. Patient denies dizziness, chest pain, abdominal pain, N/V/D/C, new weakness, numbess, tingling. No acute events overnight.    Objective:   Vitals:   03/02/18 1948 03/02/18 2301 03/03/18 0311 03/03/18 0315  BP:  (!) 128/94  134/90  Pulse:  (!) 107  (!) 106  Resp:  20  20  Temp:  97.9 F (36.6 C)  97.7 F (36.5 C)  TempSrc:  Oral  Oral  SpO2: 92% 92%  93%  Weight:   63.5 kg (139 lb 14.4 oz) 63.5 kg (139 lb 14.4 oz)  Height:    5\' 9"  (1.753 m)    Intake/Output Summary (Last 24 hours)  at 03/03/2018 1249 Last data filed at 03/03/2018 1108 Gross per 24 hour  Intake 540 ml  Output 2675 ml  Net -2135 ml     Wt Readings from Last 3 Encounters:  03/03/18 63.5 kg (139 lb 14.4 oz)  02/28/18 70.1 kg (154 lb 9.6 oz)  02/21/18 70.3 kg (155 lb)     Exam  General: Alert and oriented x 3, NAD  Eyes:   HEENT:  JVD+  Cardiovascular: S1 S2 auscultated, Regular rate and rhythm.  Respiratory: Decreased breath sound at the bases  Gastrointestinal: Soft, nontender, nondistended, + bowel sounds  Ext: trace pedal edema bilaterally  Neuro: no new deficits  Musculoskeletal: No digital cyanosis, clubbing  Skin: No rashes  Psych: Normal affect and demeanor, alert and oriented x3    Data Reviewed:  I have personally reviewed following labs and imaging studies  Micro Results No results found for this or any previous visit (from the past 240 hour(s)).  Radiology Reports Dg Chest 2 View  Result Date: 02/28/2018 CLINICAL DATA:  Shortness of breath.  Pneumonia.  Emphysema. EXAM: CHEST - 2 VIEW COMPARISON:  Most recent chest 02/21/2018. FINDINGS: Normal heart size. Pleuroparenchymal disease in the peripheral RIGHT upper lobe with volume loss and retraction of the RIGHT hilum appears stable. No definite acute infiltrate. Chronic interstitial prominence. Emphysema. No osseous findings. IMPRESSION: COPD.  Stable pleuroparenchymal scarring. Electronically Signed   By: Elsie Stain M.D.   On: 02/28/2018 18:52   Dg Chest 2 View  Addendum Date: 02/21/2018   ADDENDUM REPORT: 02/21/2018 11:33 ADDENDUM: The patient's accounts have now been merged and prior CT scan of January 02, 2018 is now available for comparison. This study demonstrates continued stability of right upper lobe pleuroparenchymal disease compared to prior exams. Repeat chest CT is not needed at this time. Electronically Signed   By: Lupita Raider, M.D.   On: 02/21/2018 11:33   Result Date: 02/21/2018 CLINICAL DATA:   Shortness of breath. EXAM: CHEST - 2 VIEW COMPARISON:  None. FINDINGS: The heart size and mediastinal contours are within normal limits. No pneumothorax or pleural effusion is noted. Mild interstitial densities are noted in both lung bases which may represent scarring, but acute superimposed edema cannot be excluded. Significant pleural thickening is seen in the right lung apex which may simply represent scarring, but a large rounded density is seen in this area concerning for possible neoplasm. The visualized skeletal structures are unremarkable. IMPRESSION: Right apical pleural thickening is noted which may simply represent scarring. However, rounded density is  seen in right lung apex as well concerning for possible neoplasm, and CT scan of the chest with intravenous contrast is recommended for further evaluation. These results will be called to the ordering clinician or representative by the Radiologist Assistant, and communication documented in the PACS or zVision Dashboard. Mild bibasilar interstitial densities are noted which may represent scarring, but acute superimposed edema cannot be excluded. Electronically Signed: By: Lupita Raider, M.D. On: 02/21/2018 11:08   Ct Angio Chest Pe W Or Wo Contrast  Result Date: 03/01/2018 CLINICAL DATA:  Inpatient. Dyspnea for 1 month. Lower extremity edema. EXAM: CT ANGIOGRAPHY CHEST WITH CONTRAST TECHNIQUE: Multidetector CT imaging of the chest was performed using the standard protocol during bolus administration of intravenous contrast. Multiplanar CT image reconstructions and MIPs were obtained to evaluate the vascular anatomy. CONTRAST:  ISOVUE-370 IOPAMIDOL (ISOVUE-370) INJECTION 76% COMPARISON:  Chest radiograph from one day prior. 01/02/2018 chest CT. FINDINGS: Cardiovascular: The study is high quality for the evaluation of pulmonary embolism. There are no filling defects in the central, lobar, segmental or subsegmental pulmonary artery branches to  suggest acute pulmonary embolism. Thoracic aorta is normal in course and caliber. Dilated main pulmonary artery (3.8 cm diameter), stable. Mild cardiomegaly with dilated right cardiac chambers. No significant pericardial fluid/thickening. Mediastinum/Nodes: No discrete thyroid nodules. Unremarkable esophagus. No pathologically enlarged axillary, mediastinal or hilar lymph nodes. Lungs/Pleura: No pneumothorax. Trace dependent right pleural effusion. No left pleural effusion. Moderate to severe centrilobular and paraseptal emphysema with mild diffuse bronchial wall thickening. No acute consolidative airspace disease or significant pulmonary nodules. Bandlike subpleural focus of consolidation in the peripheral right upper lobe measures up to 8.3 x 2.4 cm (series 6/image 35), not appreciably changed since 01/02/2018 chest CT, associated with significant volume loss, distortion and bronchiectasis. Upper abdomen: Contrast reflux into the IVC and hepatic veins. Musculoskeletal: No aggressive appearing focal osseous lesions. Moderate thoracic spondylosis. Review of the MIP images confirms the above findings. IMPRESSION: 1. No pulmonary embolism. 2. Mild cardiomegaly with enlarged right cardiac chambers. Contrast reflux into the IVC and hepatic veins. Findings suggest right heart failure. 3. Stable dilated main pulmonary artery, suggesting chronic pulmonary arterial hypertension. 4. Trace dependent right pleural effusion. 5. Stable bandlike subpleural consolidation, volume loss, distortion and bronchiectasis in the peripheral right upper lobe. Morphologically, this finding is most compatible with pleural-parenchymal scarring such as due to chronic granulomatous infection. Continued chest CT surveillance warranted in 6-12 months. 6. Moderate to severe emphysema with mild diffuse bronchial wall thickening, suggesting COPD. Emphysema (ICD10-J43.9). Electronically Signed   By: Delbert Phenix M.D.   On: 03/01/2018 13:02   US  Venous Img Lower Bilateral  Result Date: 02/28/2018 CLINICAL DATA:  67 year old male with a history of shortness of breath and swelling EXAM: BILATERAL LOWER EXTREMITY VENOUS DOPPLER ULTRASOUND TECHNIQUE: Gray-scale sonography with graded compression, as well as color Doppler and duplex ultrasound were performed to evaluate the lower extremity deep venous systems from the level of the common femoral vein and including the common femoral, femoral, profunda femoral, popliteal and calf veins including the posterior tibial, peroneal and gastrocnemius veins when visible. The superficial great saphenous vein was also interrogated. Spectral Doppler was utilized to evaluate flow at rest and with distal augmentation maneuvers in the common femoral, femoral and popliteal veins. COMPARISON:  None. FINDINGS: RIGHT LOWER EXTREMITY Common Femoral Vein: No evidence of thrombus. Normal compressibility, respiratory phasicity and response to augmentation. Saphenofemoral Junction: No evidence of thrombus. Normal compressibility and flow on color Doppler imaging.  Profunda Femoral Vein: No evidence of thrombus. Normal compressibility and flow on color Doppler imaging. Femoral Vein: No evidence of thrombus. Normal compressibility, respiratory phasicity and response to augmentation. Popliteal Vein: No evidence of thrombus. Normal compressibility, respiratory phasicity and response to augmentation. Calf Veins: No evidence of thrombus. Normal compressibility and flow on color Doppler imaging. Superficial Great Saphenous Vein: No evidence of thrombus. Normal compressibility and flow on color Doppler imaging. Other Findings:  Lower extremity edema LEFT LOWER EXTREMITY Common Femoral Vein: No evidence of thrombus. Normal compressibility, respiratory phasicity and response to augmentation. Saphenofemoral Junction: No evidence of thrombus. Normal compressibility and flow on color Doppler imaging. Profunda Femoral Vein: No evidence of thrombus.  Normal compressibility and flow on color Doppler imaging. Femoral Vein: No evidence of thrombus. Normal compressibility, respiratory phasicity and response to augmentation. Popliteal Vein: No evidence of thrombus. Normal compressibility, respiratory phasicity and response to augmentation. Calf Veins: No evidence of thrombus. Normal compressibility and flow on color Doppler imaging. Superficial Great Saphenous Vein: No evidence of thrombus. Normal compressibility and flow on color Doppler imaging. Other Findings:  Lower extremity edema IMPRESSION: Sonographic survey of the bilateral lower extremities negative for DVT Bilateral lower extremity edema Electronically Signed   By: Gilmer Mor D.O.   On: 02/28/2018 16:16    Lab Data:  CBC: Recent Labs  Lab 02/28/18 1840  WBC 8.1  NEUTROABS 4.6  HGB 13.7  HCT 43.4  MCV 87.7  PLT 247   Basic Metabolic Panel: Recent Labs  Lab 02/28/18 1840 03/01/18 0338 03/02/18 0726  NA 144 138 137  K 4.4 4.0 3.5  CL 108 104 96*  CO2 25 25 30   GLUCOSE 83 123* 103*  BUN 29* 26* 30*  CREATININE 1.33* 1.21 1.43*  CALCIUM 9.5 8.9 9.1  MG  --   --  1.8   GFR: Estimated Creatinine Clearance: 45 mL/min (A) (by C-G formula based on SCr of 1.43 mg/dL (H)). Liver Function Tests: No results for input(s): AST, ALT, ALKPHOS, BILITOT, PROT, ALBUMIN in the last 168 hours. No results for input(s): LIPASE, AMYLASE in the last 168 hours. No results for input(s): AMMONIA in the last 168 hours. Coagulation Profile: No results for input(s): INR, PROTIME in the last 168 hours. Cardiac Enzymes: Recent Labs  Lab 02/28/18 1912 02/28/18 2145 03/01/18 0338 03/01/18 0927  TROPONINI 0.03* <0.03 <0.03 <0.03   BNP (last 3 results) No results for input(s): PROBNP in the last 8760 hours. HbA1C: No results for input(s): HGBA1C in the last 72 hours. CBG: No results for input(s): GLUCAP in the last 168 hours. Lipid Profile: No results for input(s): CHOL, HDL, LDLCALC,  TRIG, CHOLHDL, LDLDIRECT in the last 72 hours. Thyroid Function Tests: No results for input(s): TSH, T4TOTAL, FREET4, T3FREE, THYROIDAB in the last 72 hours. Anemia Panel: No results for input(s): VITAMINB12, FOLATE, FERRITIN, TIBC, IRON, RETICCTPCT in the last 72 hours. Urine analysis: No results found for: COLORURINE, APPEARANCEUR, LABSPEC, PHURINE, GLUCOSEU, HGBUR, BILIRUBINUR, KETONESUR, PROTEINUR, UROBILINOGEN, NITRITE, LEUKOCYTESUR   Angila Wombles M.D. [facility] 03/03/2018, 12:49 PM  Pager: [phone] Between 7am to 7pm - call Pager - [phone]  After 7pm go to [email or web address] - password [email or web address]  Call night coverage person covering after 7pm

## 2018-03-03 NOTE — Progress Notes (Signed)
Report called to [place], Charity fundraiser on 6 [address] [employer]

## 2018-03-03 NOTE — Progress Notes (Signed)
Subjective:  Patient transferred over from [facility] with severe pulmonary hypertension.  He has abnormal septal motion and has had CTA that did not show pulmonary emboli and did not have DVT in his legs.  Currently lying in bed with no shortness of breath at rest.  Has smoked heavily for years and admits to cheating over the past  Year and a half.  Objective:  Vital Signs in the last 24 hours: BP 134/90 (BP Location: Right Arm)   Pulse (!) 106   Temp 97.7 F (36.5 C) (Oral)   Resp 20   Ht 5\' 9"  (1.753 m)   Wt 63.5 kg (139 lb 14.4 oz)   SpO2 93%   BMI 20.66 kg/m   Physical Exam: Pleasant black male lying in bed in no acute distress Lungs:  Clear  Cardiac:  rapidRegular rhythm, normal S1 and S2, no S3, S4 noted Extremities:  1+ edema present  Intake/Output from previous day: 03/15 0701 - 03/16 0700 In: 660 [P.O.:660] Out: 1625 [Urine:1625] Weight Filed Weights   03/02/18 0451 03/03/18 0311 03/03/18 0315  Weight: 68.8 kg (151 lb 10.8 oz) 63.5 kg (139 lb 14.4 oz) 63.5 kg (139 lb 14.4 oz)    Lab Results: Basic Metabolic Panel: Recent Labs    03/01/18 0338 03/02/18 0726  NA 138 137  K 4.0 3.5  CL 104 96*  CO2 25 30  GLUCOSE 123* 103*  BUN 26* 30*  CREATININE 1.21 1.43*    CBC: Recent Labs    02/28/18 1840  WBC 8.1  NEUTROABS 4.6  HGB 13.7  HCT 43.4  MCV 87.7  PLT 247    BNP    Component Value Date/Time   BNP 421.0 (H) 02/28/2018 1912    Cardiac Panel (last 3 results) Recent Labs    02/28/18 2145 03/01/18 0338 03/01/18 0927  TROPONINI <0.03 <0.03 <0.03   Telemetry: Sinus tachycardia  Assessment/Plan:  1.  Severe pulmonary hypertension possible cor pulmonale 2.  Cardiomyopathy 3.  Hypertensive heart disease 4.  Severe COPD  Recommendations:  He will need a pulmonary consultation.  I spoke to the advanced heart failure service and will plan at least a right heart cath may need a left heart cath on Monday.  Continue oxygen over the  weekend.  Renal function slightly up and may need to cut back diuresis if this persists.     Darden Palmer  MD [facility]  03/03/2018, 10:17 AM

## 2018-03-04 DIAGNOSIS — I5033 Acute on chronic diastolic (congestive) heart failure: Secondary | ICD-10-CM

## 2018-03-04 LAB — CBC
HCT: 43.2 % (ref 39.0–52.0)
Hemoglobin: 14.4 g/dL (ref 13.0–17.0)
MCH: 28.3 pg (ref 26.0–34.0)
MCHC: 33.3 g/dL (ref 30.0–36.0)
MCV: 84.9 fL (ref 78.0–100.0)
Platelets: 229 10*3/uL (ref 150–400)
RBC: 5.09 MIL/uL (ref 4.22–5.81)
RDW: 14.6 % (ref 11.5–15.5)
WBC: 8.8 10*3/uL (ref 4.0–10.5)

## 2018-03-04 LAB — BASIC METABOLIC PANEL
Anion gap: 9 (ref 5–15)
BUN: 29 mg/dL — ABNORMAL HIGH (ref 6–20)
CHLORIDE: 97 mmol/L — AB (ref 101–111)
CO2: 30 mmol/L (ref 22–32)
CREATININE: 1.24 mg/dL (ref 0.61–1.24)
Calcium: 8.6 mg/dL — ABNORMAL LOW (ref 8.9–10.3)
GFR calc non Af Amer: 58 mL/min — ABNORMAL LOW (ref >60)
GLUCOSE: 88 mg/dL (ref 65–99)
Potassium: 3.3 mmol/L — ABNORMAL LOW (ref 3.5–5.1)
Sodium: 136 mmol/L (ref 135–145)

## 2018-03-04 MED ORDER — SODIUM CHLORIDE 0.9% FLUSH: 3.0000 mL | INTRAVENOUS | Status: DC | PRN

## 2018-03-04 MED ORDER — SODIUM CHLORIDE 0.9 % IV SOLN
INTRAVENOUS | Status: DC
  Administered 2018-03-05: 06:00:00 via INTRAVENOUS

## 2018-03-04 MED ORDER — POTASSIUM CHLORIDE CRYS ER 20 MEQ PO TBCR
40.0000 meq | EXTENDED_RELEASE_TABLET | Freq: Two times a day (BID) | ORAL | Status: AC
  Administered 2018-03-04 (×2): 40 meq via ORAL
  Filled 2018-03-04 (×2): qty 2

## 2018-03-04 MED ORDER — SODIUM CHLORIDE 0.9 % IV SOLN: 250.0000 mL | INTRAVENOUS | Status: DC | PRN

## 2018-03-04 MED ORDER — SODIUM CHLORIDE 0.9% FLUSH
3.0000 mL | Freq: Two times a day (BID) | INTRAVENOUS | Status: DC
  Administered 2018-03-04: 3 mL via INTRAVENOUS

## 2018-03-04 MED ORDER — ASPIRIN 81 MG PO CHEW
81.0000 mg | CHEWABLE_TABLET | ORAL | Status: AC
  Administered 2018-03-05: 81 mg via ORAL
  Filled 2018-03-04: qty 1

## 2018-03-04 MED ORDER — DIPHENHYDRAMINE HCL 25 MG PO CAPS
25.0000 mg | ORAL_CAPSULE | Freq: Once | ORAL | Status: AC
  Administered 2018-03-04: 25 mg via ORAL
  Filled 2018-03-04: qty 1

## 2018-03-04 NOTE — Progress Notes (Signed)
Subjective:  He is dyspneic if he gets up and walks around but is comfortable in bed.  Remains with sinus tachycardia.has diuresed since admission.  Lungs are currently clear.  No chest pain.  Objective:  Vital Signs in the last 24 hours: BP 123/83 (BP Location: Right Arm)   Pulse (!) 111   Temp 97.9 F (36.6 C) (Oral)   Resp 20   Ht 5\' 9"  (1.753 m)   Wt 62 kg (136 lb 11.2 oz)   SpO2 92%   BMI 20.19 kg/m   Physical Exam: Pleasant black male lying in bed in no acute distress Lungs:  Clear  Cardiac:  Rapid regular rhythm, normal S1 and S2, no S3, S4 noted Extremities:  1+ edema present  Intake/Output from previous day: 03/16 0701 - 03/17 0700 In: -  Out: 1250 [Urine:1250] Weight Filed Weights   03/03/18 0311 03/03/18 0315 03/04/18 0531  Weight: 63.5 kg (139 lb 14.4 oz) 63.5 kg (139 lb 14.4 oz) 62 kg (136 lb 11.2 oz)    Lab Results: Basic Metabolic Panel: Recent Labs    03/03/18 1317 03/04/18 0336  NA 135 136  K 3.5 3.3*  CL 92* 97*  CO2 30 30  GLUCOSE 109* 88  BUN 27* 29*  CREATININE 1.37* 1.24    CBC: Recent Labs    03/04/18 0336  WBC 8.8  HGB 14.4  HCT 43.2  MCV 84.9  PLT 229    BNP    Component Value Date/Time   BNP 421.0 (H) 02/28/2018 1912    Cardiac Panel (last 3 results) Recent Labs    03/01/18 0927  TROPONINI <0.03   Telemetry: Sinus tachycardia  Assessment/Plan:  1.  Severe pulmonary hypertension possible cor pulmonale 2.  Cardiomyopathy 3.  Hypertensive heart disease 4.  Severe COPD  Recommendations:  He previously has seen both Dr. Sherene Sires and Dr. Dorris Fetch.  Again recommend that pulmonary see him in house. He will be seen by advanced heart failure tomorrow and plans are to do a minimum of right heart cath.  Because of the reduced LV function think he'll probably also need a left heart cath.  Will defer final decision to CHF team.  Deep nothing by mouth for catheterization.  Recommend pulmonary consult.   Darden Palmer  MD [facility]  03/04/2018, 9:22 AM

## 2018-03-04 NOTE — Progress Notes (Signed)
[facility]                                                                              Patient Demographics  Zachary [name], is a 67 y.o. male, DOB - November 23, 1951, ZHY:[id number]  Admit date - 02/28/2018   Admitting Physician Haydee Monica, MD  Outpatient Primary MD for the patient is Dettinger, Elige Radon, MD  Outpatient specialists:   LOS - 4  days   Medical records reviewed and are as summarized below:    Chief Complaint  Patient presents with  . Shortness of Breath       Brief summary   67 year old male with a history of COPD, hypertension, GERD presenting with one month history of shortness of breath and worsening lower extremity edema. 2 weeks ago, the patient was seen by his PCP and started on Symbicort, albuterol, prednisone, and azithromycin. He was noted to have oxygen saturation of 87% on RA in the office. He followed up in the office on 02/28/2018. His symptoms did not improve and was tachycardic with oxygen sats in the 70s.  Patient was recommended to go to the ED.  Complaining of orthopnea, dyspnea on exertion for the last 2-3 weeks.  Smoked heavily, quit 2 years ago with 100-pack-year history of tobacco.  Patient was transferred to [facility] due to CHF, started on IV furosemide.  Assessment & Plan    Principal Problem: Acute systolic CHF (congestive heart failure) (HCC) -Continue IV Lasix for diuresis, patient reports significant improvement since admission -2D echo on 02/28/18 showed EF of 40-45% with moderate to severe TR, PA pressure 84, moderate reduction in the RV function -Cardiology was consulted and patient was transferred to [facility] for possible right heart cath -Negative balance of 5.6 L, weight down from 151-> 136 lbs today -Creatinine improving, plan for cardiac cath in a.m. NPO after MN.  Active Problems: Acute respiratory failure with hypoxia likely due to acute systolic CHF, cor pulmonale and underlying  COPD -CT angiogram of the chest negative for PE, right-sided CHF with right upper lung bronchiectasis -Continue O2, scheduled nebs, Dulera    Essential hypertension, benign -Currently stable, continue hydralazine, Lasix   Mild acute on CKD (chronic kidney disease) stage 3, GFR 30-59 ml/min (HCC) -Baseline creatinine 1.1-1.3 -Creatinine improving 1.2, continue Lasix, monitor closely  Code Status: Full CODE STATUS DVT Prophylaxis:  scd Family Communication: Discussed in detail with the patient, all imaging results, lab results explained to the patient    Disposition Plan: When cleared by cardiology  Time Spent in minutes    Procedures:  CT angiogram of the chest  Consultants:   Cardiology  Antimicrobials:      Medications  Scheduled Meds: . furosemide  40 mg Intravenous Daily  . hydrALAZINE  25 mg Oral Q8H  . ipratropium-albuterol  3 mL Nebulization BID  . mouth rinse  15 mL Mouth Rinse BID  . mometasone-formoterol  2 puff Inhalation BID  . pantoprazole  40 mg Oral Daily  . potassium chloride  40 mEq Oral BID WC  . sodium chloride flush  3 mL Intravenous Q12H   Continuous  Infusions: . sodium chloride     PRN Meds:.sodium chloride, acetaminophen, albuterol, ondansetron (ZOFRAN) IV, polyvinyl alcohol, sodium chloride flush   Antibiotics   Anti-infectives (From admission, onward)   None        Subjective:   Zachary [name] was seen and examined today.  No complaints per patient, feeling better, no significant peripheral edema now (per patient they were very small and at the time of admission) per patient. Patient denies dizziness, chest pain, abdominal pain, N/V/D/C, new weakness, numbess, tingling. No acute events overnight.    Objective:   Vitals:   03/03/18 1940 03/03/18 2041 03/04/18 0531 03/04/18 0841  BP:  117/76 123/83   Pulse:  (!) 117 (!) 111   Resp:  20 20   Temp:  97.8 F (36.6 C) 97.9 F (36.6 C)   TempSrc:  Oral Oral   SpO2:  92%  94% 92%  Weight:   62 kg (136 lb 11.2 oz)   Height:        Intake/Output Summary (Last 24 hours) at 03/04/2018 1213 Last data filed at 03/04/2018 0900 Gross per 24 hour  Intake 480 ml  Output 500 ml  Net -20 ml     Wt Readings from Last 3 Encounters:  03/04/18 62 kg (136 lb 11.2 oz)  02/28/18 70.1 kg (154 lb 9.6 oz)  02/21/18 70.3 kg (155 lb)     Exam   General: Alert and oriented x 3, NAD  Eyes:   HEENT:  Atraumatic, normocephalic, JVD +  Cardiovascular: S1 S2 auscultated, no rubs, murmurs or gallops. Regular rate and rhythm. No pedal edema b/l  Respiratory: Decreased breath sound at the bases  Gastrointestinal: Soft, nontender, nondistended, + bowel sounds  Ext: no pedal edema bilaterally  Neuro: no neuro deficits  Musculoskeletal: No digital cyanosis, clubbing  Skin: No rashes  Psych: Normal affect and demeanor, alert and oriented x3    Data Reviewed:  I have personally reviewed following labs and imaging studies  Micro Results No results found for this or any previous visit (from the past 240 hour(s)).  Radiology Reports Dg Chest 2 View  Result Date: 02/28/2018 CLINICAL DATA:  Shortness of breath.  Pneumonia.  Emphysema. EXAM: CHEST - 2 VIEW COMPARISON:  Most recent chest 02/21/2018. FINDINGS: Normal heart size. Pleuroparenchymal disease in the peripheral RIGHT upper lobe with volume loss and retraction of the RIGHT hilum appears stable. No definite acute infiltrate. Chronic interstitial prominence. Emphysema. No osseous findings. IMPRESSION: COPD.  Stable pleuroparenchymal scarring. Electronically Signed   By: Elsie Stain M.D.   On: 02/28/2018 18:52   Dg Chest 2 View  Addendum Date: 02/21/2018   ADDENDUM REPORT: 02/21/2018 11:33 ADDENDUM: The patient's accounts have now been merged and prior CT scan of January 02, 2018 is now available for comparison. This study demonstrates continued stability of right upper lobe pleuroparenchymal disease compared  to prior exams. Repeat chest CT is not needed at this time. Electronically Signed   By: Lupita Raider, M.D.   On: 02/21/2018 11:33   Result Date: 02/21/2018 CLINICAL DATA:  Shortness of breath. EXAM: CHEST - 2 VIEW COMPARISON:  None. FINDINGS: The heart size and mediastinal contours are within normal limits. No pneumothorax or pleural effusion is noted. Mild interstitial densities are noted in both lung bases which may represent scarring, but acute superimposed edema cannot be excluded. Significant pleural thickening is seen in the right lung apex which may simply represent scarring, but a large rounded density is seen in  this area concerning for possible neoplasm. The visualized skeletal structures are unremarkable. IMPRESSION: Right apical pleural thickening is noted which may simply represent scarring. However, rounded density is seen in right lung apex as well concerning for possible neoplasm, and CT scan of the chest with intravenous contrast is recommended for further evaluation. These results will be called to the ordering clinician or representative by the Radiologist Assistant, and communication documented in the PACS or zVision Dashboard. Mild bibasilar interstitial densities are noted which may represent scarring, but acute superimposed edema cannot be excluded. Electronically Signed: By: Lupita Raider, M.D. On: 02/21/2018 11:08   Ct Angio Chest Pe W Or Wo Contrast  Result Date: 03/01/2018 CLINICAL DATA:  Inpatient. Dyspnea for 1 month. Lower extremity edema. EXAM: CT ANGIOGRAPHY CHEST WITH CONTRAST TECHNIQUE: Multidetector CT imaging of the chest was performed using the standard protocol during bolus administration of intravenous contrast. Multiplanar CT image reconstructions and MIPs were obtained to evaluate the vascular anatomy. CONTRAST:  ISOVUE-370 IOPAMIDOL (ISOVUE-370) INJECTION 76% COMPARISON:  Chest radiograph from one day prior. 01/02/2018 chest CT. FINDINGS: Cardiovascular: The  study is high quality for the evaluation of pulmonary embolism. There are no filling defects in the central, lobar, segmental or subsegmental pulmonary artery branches to suggest acute pulmonary embolism. Thoracic aorta is normal in course and caliber. Dilated main pulmonary artery (3.8 cm diameter), stable. Mild cardiomegaly with dilated right cardiac chambers. No significant pericardial fluid/thickening. Mediastinum/Nodes: No discrete thyroid nodules. Unremarkable esophagus. No pathologically enlarged axillary, mediastinal or hilar lymph nodes. Lungs/Pleura: No pneumothorax. Trace dependent right pleural effusion. No left pleural effusion. Moderate to severe centrilobular and paraseptal emphysema with mild diffuse bronchial wall thickening. No acute consolidative airspace disease or significant pulmonary nodules. Bandlike subpleural focus of consolidation in the peripheral right upper lobe measures up to 8.3 x 2.4 cm (series 6/image 35), not appreciably changed since 01/02/2018 chest CT, associated with significant volume loss, distortion and bronchiectasis. Upper abdomen: Contrast reflux into the IVC and hepatic veins. Musculoskeletal: No aggressive appearing focal osseous lesions. Moderate thoracic spondylosis. Review of the MIP images confirms the above findings. IMPRESSION: 1. No pulmonary embolism. 2. Mild cardiomegaly with enlarged right cardiac chambers. Contrast reflux into the IVC and hepatic veins. Findings suggest right heart failure. 3. Stable dilated main pulmonary artery, suggesting chronic pulmonary arterial hypertension. 4. Trace dependent right pleural effusion. 5. Stable bandlike subpleural consolidation, volume loss, distortion and bronchiectasis in the peripheral right upper lobe. Morphologically, this finding is most compatible with pleural-parenchymal scarring such as due to chronic granulomatous infection. Continued chest CT surveillance warranted in 6-12 months. 6. Moderate to severe  emphysema with mild diffuse bronchial wall thickening, suggesting COPD. Emphysema (ICD10-J43.9). Electronically Signed   By: Delbert Phenix M.D.   On: 03/01/2018 13:02   US Venous Img Lower Bilateral  Result Date: 02/28/2018 CLINICAL DATA:  67 year old male with a history of shortness of breath and swelling EXAM: BILATERAL LOWER EXTREMITY VENOUS DOPPLER ULTRASOUND TECHNIQUE: Gray-scale sonography with graded compression, as well as color Doppler and duplex ultrasound were performed to evaluate the lower extremity deep venous systems from the level of the common femoral vein and including the common femoral, femoral, profunda femoral, popliteal and calf veins including the posterior tibial, peroneal and gastrocnemius veins when visible. The superficial great saphenous vein was also interrogated. Spectral Doppler was utilized to evaluate flow at rest and with distal augmentation maneuvers in the common femoral, femoral and popliteal veins. COMPARISON:  None. FINDINGS: RIGHT LOWER EXTREMITY Common  Femoral Vein: No evidence of thrombus. Normal compressibility, respiratory phasicity and response to augmentation. Saphenofemoral Junction: No evidence of thrombus. Normal compressibility and flow on color Doppler imaging. Profunda Femoral Vein: No evidence of thrombus. Normal compressibility and flow on color Doppler imaging. Femoral Vein: No evidence of thrombus. Normal compressibility, respiratory phasicity and response to augmentation. Popliteal Vein: No evidence of thrombus. Normal compressibility, respiratory phasicity and response to augmentation. Calf Veins: No evidence of thrombus. Normal compressibility and flow on color Doppler imaging. Superficial Great Saphenous Vein: No evidence of thrombus. Normal compressibility and flow on color Doppler imaging. Other Findings:  Lower extremity edema LEFT LOWER EXTREMITY Common Femoral Vein: No evidence of thrombus. Normal compressibility, respiratory phasicity and response  to augmentation. Saphenofemoral Junction: No evidence of thrombus. Normal compressibility and flow on color Doppler imaging. Profunda Femoral Vein: No evidence of thrombus. Normal compressibility and flow on color Doppler imaging. Femoral Vein: No evidence of thrombus. Normal compressibility, respiratory phasicity and response to augmentation. Popliteal Vein: No evidence of thrombus. Normal compressibility, respiratory phasicity and response to augmentation. Calf Veins: No evidence of thrombus. Normal compressibility and flow on color Doppler imaging. Superficial Great Saphenous Vein: No evidence of thrombus. Normal compressibility and flow on color Doppler imaging. Other Findings:  Lower extremity edema IMPRESSION: Sonographic survey of the bilateral lower extremities negative for DVT Bilateral lower extremity edema Electronically Signed   By: Gilmer Mor D.O.   On: 02/28/2018 16:16    Lab Data:  CBC: Recent Labs  Lab 02/28/18 1840 03/04/18 0336  WBC 8.1 8.8  NEUTROABS 4.6  --   HGB 13.7 14.4  HCT 43.4 43.2  MCV 87.7 84.9  PLT 247 229   Basic Metabolic Panel: Recent Labs  Lab 02/28/18 1840 03/01/18 0338 03/02/18 0726 03/03/18 1317 03/04/18 0336  NA 144 138 137 135 136  K 4.4 4.0 3.5 3.5 3.3*  CL 108 104 96* 92* 97*  CO2 25 25 30 30 30   GLUCOSE 83 123* 103* 109* 88  BUN 29* 26* 30* 27* 29*  CREATININE 1.33* 1.21 1.43* 1.37* 1.24  CALCIUM 9.5 8.9 9.1 8.9 8.6*  MG  --   --  1.8  --   --    GFR: Estimated Creatinine Clearance: 50.7 mL/min (by C-G formula based on SCr of 1.24 mg/dL). Liver Function Tests: No results for input(s): AST, ALT, ALKPHOS, BILITOT, PROT, ALBUMIN in the last 168 hours. No results for input(s): LIPASE, AMYLASE in the last 168 hours. No results for input(s): AMMONIA in the last 168 hours. Coagulation Profile: No results for input(s): INR, PROTIME in the last 168 hours. Cardiac Enzymes: Recent Labs  Lab 02/28/18 1912 02/28/18 2145 03/01/18 0338  03/01/18 0927  TROPONINI 0.03* <0.03 <0.03 <0.03   BNP (last 3 results) No results for input(s): PROBNP in the last 8760 hours. HbA1C: No results for input(s): HGBA1C in the last 72 hours. CBG: No results for input(s): GLUCAP in the last 168 hours. Lipid Profile: No results for input(s): CHOL, HDL, LDLCALC, TRIG, CHOLHDL, LDLDIRECT in the last 72 hours. Thyroid Function Tests: No results for input(s): TSH, T4TOTAL, FREET4, T3FREE, THYROIDAB in the last 72 hours. Anemia Panel: No results for input(s): VITAMINB12, FOLATE, FERRITIN, TIBC, IRON, RETICCTPCT in the last 72 hours. Urine analysis: No results found for: COLORURINE, APPEARANCEUR, LABSPEC, PHURINE, GLUCOSEU, HGBUR, BILIRUBINUR, KETONESUR, PROTEINUR, UROBILINOGEN, NITRITE, LEUKOCYTESUR   Ripudeep Rai M.D. [facility] 03/04/2018, 12:13 PM  Pager: [phone] Between 7am to 7pm - call Pager - [phone]  After  7pm go to [email or web address] - password [email or web address]  Call night coverage person covering after 7pm

## 2018-03-05 ENCOUNTER — Inpatient Hospital Stay (HOSPITAL_COMMUNITY)
Admission: EM | Disposition: A | Discharge status: Home / Self Care | Payer: Self-pay | Source: Home / Self Care | Attending: Internal Medicine

## 2018-03-05 ENCOUNTER — Encounter (HOSPITAL_COMMUNITY): Payer: Self-pay | Admitting: Cardiology

## 2018-03-05 ENCOUNTER — Inpatient Hospital Stay (HOSPITAL_COMMUNITY): Payer: Medicare Other

## 2018-03-05 DIAGNOSIS — J84112 Idiopathic pulmonary fibrosis: Secondary | ICD-10-CM

## 2018-03-05 DIAGNOSIS — J61 Pneumoconiosis due to asbestos and other mineral fibers: Secondary | ICD-10-CM

## 2018-03-05 DIAGNOSIS — I272 Pulmonary hypertension, unspecified: Secondary | ICD-10-CM

## 2018-03-05 DIAGNOSIS — J9601 Acute respiratory failure with hypoxia: Secondary | ICD-10-CM

## 2018-03-05 DIAGNOSIS — J441 Chronic obstructive pulmonary disease with (acute) exacerbation: Secondary | ICD-10-CM

## 2018-03-05 DIAGNOSIS — I5021 Acute systolic (congestive) heart failure: Secondary | ICD-10-CM

## 2018-03-05 DIAGNOSIS — I509 Heart failure, unspecified: Secondary | ICD-10-CM

## 2018-03-05 HISTORY — PX: RIGHT/LEFT HEART CATH AND CORONARY ANGIOGRAPHY: CATH118266

## 2018-03-05 LAB — BASIC METABOLIC PANEL
ANION GAP: 7 (ref 5–15)
BUN: 21 mg/dL — ABNORMAL HIGH (ref 6–20)
CO2: 30 mmol/L (ref 22–32)
Calcium: 8.5 mg/dL — ABNORMAL LOW (ref 8.9–10.3)
Chloride: 99 mmol/L — ABNORMAL LOW (ref 101–111)
Creatinine, Ser: 1.26 mg/dL — ABNORMAL HIGH (ref 0.61–1.24)
GFR, EST NON AFRICAN AMERICAN: 57 mL/min — AB (ref >60)
GLUCOSE: 101 mg/dL — AB (ref 65–99)
POTASSIUM: 3.8 mmol/L (ref 3.5–5.1)
Sodium: 136 mmol/L (ref 135–145)

## 2018-03-05 LAB — CBC
HEMATOCRIT: 44.4 % (ref 39.0–52.0)
HEMOGLOBIN: 14.7 g/dL (ref 13.0–17.0)
MCH: 28.6 pg (ref 26.0–34.0)
MCHC: 33.1 g/dL (ref 30.0–36.0)
MCV: 86.4 fL (ref 78.0–100.0)
Platelets: 229 10*3/uL (ref 150–400)
RBC: 5.14 MIL/uL (ref 4.22–5.81)
RDW: 15 % (ref 11.5–15.5)
WBC: 8.3 10*3/uL (ref 4.0–10.5)

## 2018-03-05 LAB — POCT I-STAT 3, VENOUS BLOOD GAS (G3P V)
ACID-BASE EXCESS: 7 mmol/L — AB (ref 0.0–2.0)
Acid-Base Excess: 7 mmol/L — ABNORMAL HIGH (ref 0.0–2.0)
Bicarbonate: 33.4 mmol/L — ABNORMAL HIGH (ref 20.0–28.0)
Bicarbonate: 34.1 mmol/L — ABNORMAL HIGH (ref 20.0–28.0)
O2 SAT: 66 %
O2 SAT: 66 %
PCO2 VEN: 55.1 mmHg (ref 44.0–60.0)
PCO2 VEN: 55.7 mmHg (ref 44.0–60.0)
PO2 VEN: 35 mmHg (ref 32.0–45.0)
TCO2: 35 mmol/L — AB (ref 22–32)
TCO2: 36 mmol/L — ABNORMAL HIGH (ref 22–32)
pH, Ven: 7.391 (ref 7.250–7.430)
pH, Ven: 7.394 (ref 7.250–7.430)
pO2, Ven: 35 mmHg (ref 32.0–45.0)

## 2018-03-05 LAB — PROTIME-INR
INR: 1.27
Prothrombin Time: 15.8 seconds — ABNORMAL HIGH (ref 11.4–15.2)

## 2018-03-05 SURGERY — RIGHT/LEFT HEART CATH AND CORONARY ANGIOGRAPHY
Anesthesia: LOCAL

## 2018-03-05 MED ORDER — SODIUM CHLORIDE 0.9 % WEIGHT BASED INFUSION: 1.0000 mL/kg/h | INTRAVENOUS | Status: AC

## 2018-03-05 MED ORDER — MIDAZOLAM HCL 2 MG/2ML IJ SOLN
INTRAMUSCULAR | Status: DC | PRN
Start: 2018-03-05 — End: 2018-03-05
  Administered 2018-03-05: 1 mg via INTRAVENOUS

## 2018-03-05 MED ORDER — VERAPAMIL HCL 2.5 MG/ML IV SOLN
INTRAVENOUS | Status: DC | PRN
  Administered 2018-03-05: 10 mL via INTRA_ARTERIAL

## 2018-03-05 MED ORDER — ENOXAPARIN SODIUM 40 MG/0.4ML ~~LOC~~ SOLN: 40.0000 mg | SUBCUTANEOUS | Status: DC

## 2018-03-05 MED ORDER — IPRATROPIUM-ALBUTEROL 0.5-2.5 (3) MG/3ML IN SOLN: 3.0000 mL | Freq: Four times a day (QID) | RESPIRATORY_TRACT | Status: DC | PRN

## 2018-03-05 MED ORDER — MIDAZOLAM HCL 2 MG/2ML IJ SOLN
INTRAMUSCULAR | Status: AC
  Filled 2018-03-05: qty 2

## 2018-03-05 MED ORDER — FENTANYL CITRATE (PF) 100 MCG/2ML IJ SOLN
INTRAMUSCULAR | Status: DC | PRN
  Administered 2018-03-05: 25 ug via INTRAVENOUS

## 2018-03-05 MED ORDER — TECHNETIUM TC 99M DIETHYLENETRIAME-PENTAACETIC ACID
30.0000 | Freq: Once | INTRAVENOUS | Status: AC | PRN
  Administered 2018-03-05: 30 via RESPIRATORY_TRACT

## 2018-03-05 MED ORDER — HEPARIN (PORCINE) IN NACL 2-0.9 UNIT/ML-% IJ SOLN
INTRAMUSCULAR | Status: AC | PRN
  Administered 2018-03-05: 500 mL

## 2018-03-05 MED ORDER — LIDOCAINE HCL 1 % IJ SOLN
INTRAMUSCULAR | Status: AC
  Filled 2018-03-05: qty 20

## 2018-03-05 MED ORDER — VERAPAMIL HCL 2.5 MG/ML IV SOLN
INTRAVENOUS | Status: AC
  Filled 2018-03-05: qty 2

## 2018-03-05 MED ORDER — LOSARTAN POTASSIUM 25 MG PO TABS
25.0000 mg | ORAL_TABLET | Freq: Every day | ORAL | Status: DC
  Administered 2018-03-05: 25 mg via ORAL
  Filled 2018-03-05: qty 1

## 2018-03-05 MED ORDER — ONDANSETRON HCL 4 MG/2ML IJ SOLN: 4.0000 mg | Freq: Four times a day (QID) | INTRAMUSCULAR | Status: DC | PRN

## 2018-03-05 MED ORDER — LIDOCAINE HCL (PF) 1 % IJ SOLN
INTRAMUSCULAR | Status: DC | PRN
  Administered 2018-03-05 (×2): 3 mL

## 2018-03-05 MED ORDER — POTASSIUM CHLORIDE CRYS ER 20 MEQ PO TBCR
40.0000 meq | EXTENDED_RELEASE_TABLET | Freq: Once | ORAL | Status: AC
  Administered 2018-03-05: 40 meq via ORAL
  Filled 2018-03-05: qty 2

## 2018-03-05 MED ORDER — IOPAMIDOL (ISOVUE-370) INJECTION 76%
INTRAVENOUS | Status: AC
  Filled 2018-03-05: qty 100

## 2018-03-05 MED ORDER — HEPARIN SODIUM (PORCINE) 1000 UNIT/ML IJ SOLN
INTRAMUSCULAR | Status: AC
  Filled 2018-03-05: qty 1

## 2018-03-05 MED ORDER — HEPARIN SODIUM (PORCINE) 1000 UNIT/ML IJ SOLN
INTRAMUSCULAR | Status: DC | PRN
  Administered 2018-03-05: 3000 [IU] via INTRAVENOUS

## 2018-03-05 MED ORDER — FUROSEMIDE 20 MG PO TABS
20.0000 mg | ORAL_TABLET | Freq: Every day | ORAL | Status: DC
  Administered 2018-03-06: 20 mg via ORAL
  Filled 2018-03-05: qty 1

## 2018-03-05 MED ORDER — IOPAMIDOL (ISOVUE-370) INJECTION 76%
INTRAVENOUS | Status: DC | PRN
  Administered 2018-03-05: 50 mL via INTRA_ARTERIAL

## 2018-03-05 MED ORDER — SODIUM CHLORIDE 0.9% FLUSH: 3.0000 mL | INTRAVENOUS | Status: DC | PRN

## 2018-03-05 MED ORDER — SODIUM CHLORIDE 0.9 % IV SOLN: 250.0000 mL | INTRAVENOUS | Status: DC | PRN

## 2018-03-05 MED ORDER — HEPARIN (PORCINE) IN NACL 2-0.9 UNIT/ML-% IJ SOLN
INTRAMUSCULAR | Status: AC
  Filled 2018-03-05: qty 1000

## 2018-03-05 MED ORDER — ASPIRIN 81 MG PO CHEW
81.0000 mg | CHEWABLE_TABLET | Freq: Every day | ORAL | Status: DC
  Administered 2018-03-06: 81 mg via ORAL
  Filled 2018-03-05: qty 1

## 2018-03-05 MED ORDER — FENTANYL CITRATE (PF) 100 MCG/2ML IJ SOLN
INTRAMUSCULAR | Status: AC
  Filled 2018-03-05: qty 2

## 2018-03-05 MED ORDER — HEPARIN SODIUM (PORCINE) 5000 UNIT/ML IJ SOLN
5000.0000 [IU] | Freq: Three times a day (TID) | INTRAMUSCULAR | Status: DC
  Administered 2018-03-05 – 2018-03-06 (×2): 5000 [IU] via SUBCUTANEOUS
  Filled 2018-03-05 (×2): qty 1

## 2018-03-05 MED ORDER — TECHNETIUM TO 99M ALBUMIN AGGREGATED
4.1000 | Freq: Once | INTRAVENOUS | Status: AC | PRN
  Administered 2018-03-05: 4.1 via INTRAVENOUS

## 2018-03-05 MED ORDER — SODIUM CHLORIDE 0.9% FLUSH: 3.0000 mL | Freq: Two times a day (BID) | INTRAVENOUS | Status: DC

## 2018-03-05 MED ORDER — ACETAMINOPHEN 325 MG PO TABS: 650.0000 mg | ORAL_TABLET | ORAL | Status: DC | PRN

## 2018-03-05 SURGICAL SUPPLY — 13 items
BAND ZEPHYR COMPRESS 30 LONG (HEMOSTASIS) ×2 IMPLANT
CATH BALLN WEDGE 5F 110CM (CATHETERS) ×2 IMPLANT
CATH INFINITI 5 FR JL3.5 (CATHETERS) ×2 IMPLANT
CATH INFINITI JR4 5F (CATHETERS) ×2 IMPLANT
GUIDEWIRE .025 260CM (WIRE) ×2 IMPLANT
GUIDEWIRE INQWIRE 1.5J.035X260 (WIRE) ×1 IMPLANT
INQWIRE 1.5J .035X260CM (WIRE) ×2
KIT HEART LEFT (KITS) ×2 IMPLANT
PACK CARDIAC CATHETERIZATION (CUSTOM PROCEDURE TRAY) ×2 IMPLANT
SHEATH GLIDE SLENDER 4/5FR (SHEATH) ×2 IMPLANT
SHEATH RAIN RADIAL 21G 6FR (SHEATH) ×2 IMPLANT
TRANSDUCER W/STOPCOCK (MISCELLANEOUS) ×2 IMPLANT
TUBING CIL FLEX 10 FLL-RA (TUBING) ×2 IMPLANT

## 2018-03-05 NOTE — Progress Notes (Signed)
[facility]                                                                              Patient Demographics  Zachary [name], is a 67 y.o. male, DOB - 1951-05-27, SWN:[id number]  Admit date - 02/28/2018   Admitting Physician Haydee Monica, MD  Outpatient Primary MD for the patient is Dettinger, Elige Radon, MD  Outpatient specialists:   LOS - 5  days   Medical records reviewed and are as summarized below:    Chief Complaint  Patient presents with  . Shortness of Breath       Brief summary   67 year old male with a history of COPD, hypertension, GERD presenting with one month history of shortness of breath and worsening lower extremity edema. 2 weeks ago, the patient was seen by his PCP and started on Symbicort, albuterol, prednisone, and azithromycin. He was noted to have oxygen saturation of 87% on RA in the office. He followed up in the office on 02/28/2018. His symptoms did not improve and was tachycardic with oxygen sats in the 70s.  Patient was recommended to go to the ED.  Complaining of orthopnea, dyspnea on exertion for the last 2-3 weeks.  Smoked heavily, quit 2 years ago with 100-pack-year history of tobacco.  Patient was transferred to [facility] due to CHF, started on IV furosemide.  Assessment & Plan    Principal Problem: Acute systolic CHF (congestive heart failure) (HCC) with right heart failure, pulmonary hypertension -Continue IV Lasix for diuresis, patient reports significant improvement since admission -2D echo on 02/28/18 showed EF of 40-45% with moderate to severe TR, PA pressure 84, moderate reduction in the RV function -Negative balance of 8.6 L, weight down from 151-> 136lbs -Right and left heart catheterization today to ascertain etiology of CHF, pulmonology also consulted - Home O2 evaluation at discharge  Active Problems: Acute respiratory failure with hypoxia likely due to acute systolic CHF, cor pulmonale and underlying  COPD, severe pulmonary hypertension -CT angiogram of the chest negative for PE, right-sided CHF with right upper lung bronchiectasis -Continue O2, scheduled nebs, Dulera, PCCM consulted - Follow VQ scan to assess for chronic PE, ?PFT's    Essential hypertension, benign -Currently stable, continue hydralazine, Lasix  Mild acute on CKD (chronic kidney disease) stage 3, GFR 30-59 ml/min (HCC) -Baseline creatinine 1.1-1.3 -Creatinine stable 1.2, continue Lasix, monitor closely  Code Status: Full CODE STATUS DVT Prophylaxis:  scd Family Communication: Discussed in detail with the patient, all imaging results, lab results explained to the patient and wife at the bedside   Disposition Plan: When cleared by cardiology  Time Spent in minutes    Procedures:  CT angiogram of the chest  Consultants:   Cardiology Pulmonology  Antimicrobials:      Medications  Scheduled Meds: . [MAR Hold] enoxaparin (LOVENOX) injection  40 mg Subcutaneous Q24H  . [MAR Hold] furosemide  40 mg Intravenous Daily  . [MAR Hold] hydrALAZINE  25 mg Oral Q8H  . [MAR Hold] ipratropium-albuterol  3 mL Nebulization BID  . [MAR Hold] mouth rinse  15 mL Mouth Rinse BID  . [facility]  Hold] mometasone-formoterol  2 puff Inhalation BID  . [MAR Hold] pantoprazole  40 mg Oral Daily  . [MAR Hold] sodium chloride flush  3 mL Intravenous Q12H  . sodium chloride flush  3 mL Intravenous Q12H   Continuous Infusions: . [MAR Hold] sodium chloride    . sodium chloride    . sodium chloride 10 mL/hr at 03/05/18 0559  . heparin    . heparin     PRN Meds:.[MAR Hold] sodium chloride, sodium chloride, [MAR Hold] acetaminophen, [MAR Hold] albuterol, fentaNYL, heparin, heparin, lidocaine (PF), midazolam, [MAR Hold] ondansetron (ZOFRAN) IV, [MAR Hold] polyvinyl alcohol, Radial Cocktail/Verapamil only, [MAR Hold] sodium chloride flush, sodium chloride flush   Antibiotics   Anti-infectives (From admission, onward)   None          Subjective:   Zachary [name] was seen and examined today. Feels better from the time of admission, no significant peripheral edema, currently no chest pain or shortness of breath. Patient denies dizziness, chest pain, abdominal pain, N/V/D/C, new weakness, numbess, tingling. No acute issues overnight., No fevers.  Objective:   Vitals:   03/04/18 2213 03/05/18 0525 03/05/18 0909 03/05/18 1302  BP:  106/86    Pulse: (!) 118 (!) 115    Resp:  20    Temp:  97.9 F (36.6 C)    TempSrc:  Oral    SpO2: 92% 95% 92% 92%  Weight:  61.9 kg (136 lb 6.4 oz)    Height:        Intake/Output Summary (Last 24 hours) at 03/05/2018 1328 Last data filed at 03/05/2018 1200 Gross per 24 hour  Intake 249.5 ml  Output 2500 ml  Net -2250.5 ml     Wt Readings from Last 3 Encounters:  03/05/18 61.9 kg (136 lb 6.4 oz)  02/28/18 70.1 kg (154 lb 9.6 oz)  02/21/18 70.3 kg (155 lb)     Exam   General: Alert and oriented x 3, NAD  Eyes:   HEENT:  Atraumatic, normocephalic, JVD +  Cardiovascular: S1 S2 auscultated, tachycardia, RRR  No pedal edema b/l  Respiratory: Decreased breath sounds at the bases  Gastrointestinal: Soft, nontender, nondistended, + bowel sounds  Ext: no pedal edema bilaterally  Neuro: no new deficits  Musculoskeletal: No digital cyanosis, clubbing  Skin: No rashes  Psych: Normal affect and demeanor, alert and oriented x3    Data Reviewed:  I have personally reviewed following labs and imaging studies  Micro Results No results found for this or any previous visit (from the past 240 hour(s)).  Radiology Reports Dg Chest 2 View  Result Date: 02/28/2018 CLINICAL DATA:  Shortness of breath.  Pneumonia.  Emphysema. EXAM: CHEST - 2 VIEW COMPARISON:  Most recent chest 02/21/2018. FINDINGS: Normal heart size. Pleuroparenchymal disease in the peripheral RIGHT upper lobe with volume loss and retraction of the RIGHT hilum appears stable. No definite acute  infiltrate. Chronic interstitial prominence. Emphysema. No osseous findings. IMPRESSION: COPD.  Stable pleuroparenchymal scarring. Electronically Signed   By: Elsie Stain M.D.   On: 02/28/2018 18:52   Dg Chest 2 View  Addendum Date: 02/21/2018   ADDENDUM REPORT: 02/21/2018 11:33 ADDENDUM: The patient's accounts have now been merged and prior CT scan of January 02, 2018 is now available for comparison. This study demonstrates continued stability of right upper lobe pleuroparenchymal disease compared to prior exams. Repeat chest CT is not needed at this time. Electronically Signed   By: Lupita Raider, M.D.   On: 02/21/2018 11:33  Result Date: 02/21/2018 CLINICAL DATA:  Shortness of breath. EXAM: CHEST - 2 VIEW COMPARISON:  None. FINDINGS: The heart size and mediastinal contours are within normal limits. No pneumothorax or pleural effusion is noted. Mild interstitial densities are noted in both lung bases which may represent scarring, but acute superimposed edema cannot be excluded. Significant pleural thickening is seen in the right lung apex which may simply represent scarring, but a large rounded density is seen in this area concerning for possible neoplasm. The visualized skeletal structures are unremarkable. IMPRESSION: Right apical pleural thickening is noted which may simply represent scarring. However, rounded density is seen in right lung apex as well concerning for possible neoplasm, and CT scan of the chest with intravenous contrast is recommended for further evaluation. These results will be called to the ordering clinician or representative by the Radiologist Assistant, and communication documented in the PACS or zVision Dashboard. Mild bibasilar interstitial densities are noted which may represent scarring, but acute superimposed edema cannot be excluded. Electronically Signed: By: Lupita Raider, M.D. On: 02/21/2018 11:08   Ct Angio Chest Pe W Or Wo Contrast  Result Date:  03/01/2018 CLINICAL DATA:  Inpatient. Dyspnea for 1 month. Lower extremity edema. EXAM: CT ANGIOGRAPHY CHEST WITH CONTRAST TECHNIQUE: Multidetector CT imaging of the chest was performed using the standard protocol during bolus administration of intravenous contrast. Multiplanar CT image reconstructions and MIPs were obtained to evaluate the vascular anatomy. CONTRAST:  ISOVUE-370 IOPAMIDOL (ISOVUE-370) INJECTION 76% COMPARISON:  Chest radiograph from one day prior. 01/02/2018 chest CT. FINDINGS: Cardiovascular: The study is high quality for the evaluation of pulmonary embolism. There are no filling defects in the central, lobar, segmental or subsegmental pulmonary artery branches to suggest acute pulmonary embolism. Thoracic aorta is normal in course and caliber. Dilated main pulmonary artery (3.8 cm diameter), stable. Mild cardiomegaly with dilated right cardiac chambers. No significant pericardial fluid/thickening. Mediastinum/Nodes: No discrete thyroid nodules. Unremarkable esophagus. No pathologically enlarged axillary, mediastinal or hilar lymph nodes. Lungs/Pleura: No pneumothorax. Trace dependent right pleural effusion. No left pleural effusion. Moderate to severe centrilobular and paraseptal emphysema with mild diffuse bronchial wall thickening. No acute consolidative airspace disease or significant pulmonary nodules. Bandlike subpleural focus of consolidation in the peripheral right upper lobe measures up to 8.3 x 2.4 cm (series 6/image 35), not appreciably changed since 01/02/2018 chest CT, associated with significant volume loss, distortion and bronchiectasis. Upper abdomen: Contrast reflux into the IVC and hepatic veins. Musculoskeletal: No aggressive appearing focal osseous lesions. Moderate thoracic spondylosis. Review of the MIP images confirms the above findings. IMPRESSION: 1. No pulmonary embolism. 2. Mild cardiomegaly with enlarged right cardiac chambers. Contrast reflux into the IVC and  hepatic veins. Findings suggest right heart failure. 3. Stable dilated main pulmonary artery, suggesting chronic pulmonary arterial hypertension. 4. Trace dependent right pleural effusion. 5. Stable bandlike subpleural consolidation, volume loss, distortion and bronchiectasis in the peripheral right upper lobe. Morphologically, this finding is most compatible with pleural-parenchymal scarring such as due to chronic granulomatous infection. Continued chest CT surveillance warranted in 6-12 months. 6. Moderate to severe emphysema with mild diffuse bronchial wall thickening, suggesting COPD. Emphysema (ICD10-J43.9). Electronically Signed   By: Delbert Phenix M.D.   On: 03/01/2018 13:02   US Venous Img Lower Bilateral  Result Date: 02/28/2018 CLINICAL DATA:  67 year old male with a history of shortness of breath and swelling EXAM: BILATERAL LOWER EXTREMITY VENOUS DOPPLER ULTRASOUND TECHNIQUE: Gray-scale sonography with graded compression, as well as color Doppler and duplex ultrasound  were performed to evaluate the lower extremity deep venous systems from the level of the common femoral vein and including the common femoral, femoral, profunda femoral, popliteal and calf veins including the posterior tibial, peroneal and gastrocnemius veins when visible. The superficial great saphenous vein was also interrogated. Spectral Doppler was utilized to evaluate flow at rest and with distal augmentation maneuvers in the common femoral, femoral and popliteal veins. COMPARISON:  None. FINDINGS: RIGHT LOWER EXTREMITY Common Femoral Vein: No evidence of thrombus. Normal compressibility, respiratory phasicity and response to augmentation. Saphenofemoral Junction: No evidence of thrombus. Normal compressibility and flow on color Doppler imaging. Profunda Femoral Vein: No evidence of thrombus. Normal compressibility and flow on color Doppler imaging. Femoral Vein: No evidence of thrombus. Normal compressibility, respiratory phasicity  and response to augmentation. Popliteal Vein: No evidence of thrombus. Normal compressibility, respiratory phasicity and response to augmentation. Calf Veins: No evidence of thrombus. Normal compressibility and flow on color Doppler imaging. Superficial Great Saphenous Vein: No evidence of thrombus. Normal compressibility and flow on color Doppler imaging. Other Findings:  Lower extremity edema LEFT LOWER EXTREMITY Common Femoral Vein: No evidence of thrombus. Normal compressibility, respiratory phasicity and response to augmentation. Saphenofemoral Junction: No evidence of thrombus. Normal compressibility and flow on color Doppler imaging. Profunda Femoral Vein: No evidence of thrombus. Normal compressibility and flow on color Doppler imaging. Femoral Vein: No evidence of thrombus. Normal compressibility, respiratory phasicity and response to augmentation. Popliteal Vein: No evidence of thrombus. Normal compressibility, respiratory phasicity and response to augmentation. Calf Veins: No evidence of thrombus. Normal compressibility and flow on color Doppler imaging. Superficial Great Saphenous Vein: No evidence of thrombus. Normal compressibility and flow on color Doppler imaging. Other Findings:  Lower extremity edema IMPRESSION: Sonographic survey of the bilateral lower extremities negative for DVT Bilateral lower extremity edema Electronically Signed   By: Gilmer Mor D.O.   On: 02/28/2018 16:16    Lab Data:  CBC: Recent Labs  Lab 02/28/18 1840 03/04/18 0336 03/05/18 0255  WBC 8.1 8.8 8.3  NEUTROABS 4.6  --   --   HGB 13.7 14.4 14.7  HCT 43.4 43.2 44.4  MCV 87.7 84.9 86.4  PLT 247 229 229   Basic Metabolic Panel: Recent Labs  Lab 03/01/18 0338 03/02/18 0726 03/03/18 1317 03/04/18 0336 03/05/18 0255  NA 138 137 135 136 136  K 4.0 3.5 3.5 3.3* 3.8  CL 104 96* 92* 97* 99*  CO2 25 30 30 30 30   GLUCOSE 123* 103* 109* 88 101*  BUN 26* 30* 27* 29* 21*  CREATININE 1.21 1.43* 1.37* 1.24  1.26*  CALCIUM 8.9 9.1 8.9 8.6* 8.5*  MG  --  1.8  --   --   --    GFR: Estimated Creatinine Clearance: 49.8 mL/min (A) (by C-G formula based on SCr of 1.26 mg/dL (H)). Liver Function Tests: No results for input(s): AST, ALT, ALKPHOS, BILITOT, PROT, ALBUMIN in the last 168 hours. No results for input(s): LIPASE, AMYLASE in the last 168 hours. No results for input(s): AMMONIA in the last 168 hours. Coagulation Profile: Recent Labs  Lab 03/05/18 0255  INR 1.27   Cardiac Enzymes: Recent Labs  Lab 02/28/18 1912 02/28/18 2145 03/01/18 0338 03/01/18 0927  TROPONINI 0.03* <0.03 <0.03 <0.03   BNP (last 3 results) No results for input(s): PROBNP in the last 8760 hours. HbA1C: No results for input(s): HGBA1C in the last 72 hours. CBG: No results for input(s): GLUCAP in the last 168 hours. Lipid Profile: No  results for input(s): CHOL, HDL, LDLCALC, TRIG, CHOLHDL, LDLDIRECT in the last 72 hours. Thyroid Function Tests: No results for input(s): TSH, T4TOTAL, FREET4, T3FREE, THYROIDAB in the last 72 hours. Anemia Panel: No results for input(s): VITAMINB12, FOLATE, FERRITIN, TIBC, IRON, RETICCTPCT in the last 72 hours. Urine analysis: No results found for: COLORURINE, APPEARANCEUR, LABSPEC, PHURINE, GLUCOSEU, HGBUR, BILIRUBINUR, KETONESUR, PROTEINUR, UROBILINOGEN, NITRITE, LEUKOCYTESUR   Zachary [name] M.D. [facility] 03/05/2018, 1:28 PM  Pager: [phone] Between 7am to 7pm - call Pager - [phone]  After 7pm go to [email or web address] - password [email or web address]  Call night coverage person covering after 7pm

## 2018-03-05 NOTE — Consult Note (Addendum)
Name: MARDOCHEE REICHLING MRN: [id number] DOB: [birth year]    ADMISSION DATE:  02/28/2018 CONSULTATION DATE:  03/05/2018  REFERRING MD :  Dr. Isidoro Donning  CHIEF COMPLAINT:  Dyspnea  HISTORY OF PRESENT ILLNESS:   67 year old male with past medical history of tobacco abuse- 1 ppd x 50 yrs, COPD- not on home O2, chronic RUL infiltrate since prior pneumonia in 2017, hypertension, and GERD admitted to [facility] on 3/13 with acute hypoxic respiratory failure of unclear etiology.    Noted previous asbestos exposure with previous job at [employer] in which is states is followed at [place] [place], through pulmonologist, Dr. Adah Perl.  Patient last seen in our office by Dr. Sherene Sires in 01/03/2017.  Is followed by Dr. Dorris Fetch every 6 months for chronic RUL mass-like opacity dating back to 2017.  PET scan showing hypermetabolic activity.  Twice biopsied, by bronch and CT-guided, both showing atypical cells, no definite tumor.  Followed by chest CT every 6 months. PFTs showing with obstructive pattern with DLCO of 21% predicted.  Denies prior any prior blood clots, PE, joint pain or swelling, rashes, or family history of autoimmune disease.  Patient states he quit smoking 1-2 months ago but occasionally still "cheats".  Patient presented after one month of progressive dyspnea, lower extremity edema, and orthopnea.  Found to be hypoxic by PCP on 3/6 treated with azithromycin, prednisone, albuterol and Symbicort without improvement with worsening hypoxia.  No fevers.  Bilateral lower extremity ultrasounds negative for DVT.  CTA negative for pulmonary embolism but with significant emphysema and RUL bronchiectasis.  TTE on 3/13 showed reduced systolic EF 40-45% with severe dilation of RV with moderate systolic dysfunction, moderate to sever TR,  with PAP of 84 mm Hg, and small pericardial effusion.  He was diuresed with lasix since admission is presently down 12lbs.  Cardiology consulted.  He was taken for a RHC today.  PCCM  consulted for further pulmonary recommendations.   PAST MEDICAL HISTORY :   has a past medical history of COPD, severe (HCC), Essential hypertension, GERD (gastroesophageal reflux disease), History of asbestos exposure, and Mass of upper lobe of right lung.  has a past surgical history that includes Video bronchoscopy (Bilateral, 12/07/2016). Prior to Admission medications   Medication Sig Start Date End Date Taking? Authorizing Provider  albuterol (PROVENTIL HFA;VENTOLIN HFA) 108 (90 Base) MCG/ACT inhaler Inhale 2 puffs into the lungs every 6 (six) hours as needed for wheezing or shortness of breath. 02/21/18  Yes Dettinger, Elige Radon, MD  budesonide-formoterol (SYMBICORT) 160-4.5 MCG/ACT inhaler Inhale 2 puffs into the lungs 2 (two) times daily. 02/21/18  Yes Dettinger, Elige Radon, MD  hydrochlorothiazide (MICROZIDE) 12.5 MG capsule Take 1 capsule (12.5 mg total) daily by mouth. 11/03/17  Yes Dettinger, Elige Radon, MD  hydroxypropyl methylcellulose / hypromellose (ISOPTO TEARS / GONIOVISC) 2.5 % ophthalmic solution Place 1 drop into both eyes as needed for dry eyes.   Yes [provider]  Naproxen Sod-Diphenhydramine (ALEVE PM) 220-25 MG TABS Take 0.5 tablets by mouth at bedtime as needed (sleep).   Yes [provider]  omeprazole (PRILOSEC) 40 MG capsule TAKE 1 CAPSULE (40 MG TOTAL) BY MOUTH DAILY. 11/03/17  Yes Dettinger, Elige Radon, MD  predniSONE (DELTASONE) 20 MG tablet Take 3 tabs daily for 1 week, then 2 tabs daily for week 2, then 1 tab daily for week 3. Patient taking differently: Take 20 mg by mouth daily as needed (for appetite).  02/21/18  Yes Dettinger, Elige Radon, MD  Allergies  Allergen Reactions  . Aspirin Other (See Comments)    Stomach burning     FAMILY HISTORY:  family history includes Heart disease in his father. SOCIAL HISTORY:  reports that he quit smoking about 9 months ago. His smoking use included cigarettes. He started smoking about 44 years ago. He smoked  0.10 packs per day. he has never used smokeless tobacco. He reports that he does not drink alcohol or use drugs.  REVIEW OF SYSTEMS:  POSITIVES In BOLD Constitutional: Negative for fever, chills, weight loss, malaise/fatigue  HENT: Negative for hearing loss, ear pain, nosebleeds, congestion, sore throat, neck pain, tinnitus and ear discharge.   Respiratory: Negative for cough, hemoptysis, sputum production, shortness of breath, wheezing    Cardiovascular: Negative for chest pain, palpitations, orthopnea, claudication, leg swelling and PND.  Gastrointestinal: Negative for nausea, vomiting, abdominal pain, diarrhea, constipation, blood in stool and melena.  Genitourinary: Negative for dysuria, urgency, frequency, hematuria  Neurological: Negative for dizziness, speech change, focal weakness, seizures, loss of consciousness, weakness and headaches.   SUBJECTIVE:   VITAL SIGNS: Temp:  [97.9 F (36.6 C)-98 F (36.7 C)] 97.9 F (36.6 C) (03/18 0525) Pulse Rate:  [112-119] 115 (03/18 0525) Resp:  [20] 20 (03/18 0525) BP: (103-106)/(76-86) 106/86 (03/18 0525) SpO2:  [92 %-95 %] 92 % (03/18 1302) Weight:  [136 lb 6.4 oz (61.9 kg)] 136 lb 6.4 oz (61.9 kg) (03/18 0525)  PHYSICAL EXAMINATION: General:  Thin adult male lying in bed in NAD HEENT: MM pink/moist, PERRL, +mild JVD Neuro: Alert, oriented, MAE, non focal  CV:  RRR, 2+ pulses, TR band to right wrist PULM: even/non-labored, lungs bilaterally clear and diminished, no wheezes GI: soft, non-tender, bsx4 active  Extremities: warm/dry, no BLE  Skin: no rashes   Recent Labs  Lab 03/03/18 1317 03/04/18 0336 03/05/18 0255  NA 135 136 136  K 3.5 3.3* 3.8  CL 92* 97* 99*  CO2 30 30 30   BUN 27* 29* 21*  CREATININE 1.37* 1.24 1.26*  GLUCOSE 109* 88 101*   Recent Labs  Lab 02/28/18 1840 03/04/18 0336 03/05/18 0255  HGB 13.7 14.4 14.7  HCT 43.4 43.2 44.4  WBC 8.1 8.8 8.3  PLT 247 229 229   No results found.  SIGNIFICANT  EVENTS  3/13 Admit 3/18 RHC  STUDIES:  3/13 BLE venous duplex >> neg 3/13 TTE >>  LVEF 40-45%, difficult to assess wall motion, possible hypokinesis of inferior and apical myocardium, RV severely dilated with reduced systolic function, RA severely dilated, mod-sev TR, PAP 84 mm Hg, small pericardial effusion   3/14 CTA chest >> 1. No pulmonary embolism. 2. Mild cardiomegaly with enlarged right cardiac chambers. Contrast reflux into the IVC and hepatic veins. Findings suggest right heart failure. 3. Stable dilated main pulmonary artery, suggesting chronic pulmonary arterial hypertension. 4. Trace dependent right pleural effusion. 5. Stable bandlike subpleural consolidation, volume loss, distortion and bronchiectasis in the peripheral right upper lobe. Morphologically, this finding is most compatible with pleural-parenchymal scarring such as due to chronic granulomatous infection. Continued chest CT surveillance warranted in 6-12 months. 6. Moderate to severe emphysema with mild diffuse bronchial wall thickening, suggesting COPD. 7.  Emphysema   3/18 RHC >>  3/18 NM perf studies >>  ASSESSMENT / PLAN:  Acute on chronic hypoxic respiratory failure Acute on chronic systolic HF  Pulmonary hypertension - PAP > 80 on TTE, likely due to combined group 2/3, r/o group 1 and 4 Severe COPD/ bullous emphysema without acute exacerbation  Former Tobacco abuse- still smokes occasionally Chronic RUL opacity - CTA neg for PE - Improvement with diuresis P:  Continue to wean supplemental O2 for sats >90% (PAH not COPD) Ambulatory walk test to determine home O2 needs RHC pending NM perfusion to r/o CTEPH Per Cardiology/HF team Check autoimmune panel and HIV Diuresis per Cards as BP / sCr allows Continue albuterol prn and dulera, change duonebs to prn  No role for steroids Ongoing smoking cessation Follow-up with Dr. Dorris Fetch as scheduled for CT chest q 6 months, and  Follow-up with  Dr. Sherene Sires in office - consider outpt HRCT with next imaging and outpt sleep study  Posey Boyer, AGACNP-BC [facility] Pgr: [phone] or if no answer [phone] 03/05/2018, 3:14 PM  Attending Note:  67 year old male with PMH of asbestos exposure, COPD and pulmonary fibrosis that is presenting with acute on chronic hypoxemic respiratory failure.  ON exam, BS are distant.  I reviewed chest CT myself, RUL fibrotic changes noted with diffuse severe emphysema and chronic changes consistent with asbestosis.  Discussed with PCCM-NP.  Acute on chronic respiratory failure, multi-factorial  - Titrate O2 for sat of 90% or higher since this is likely due to respiratory hypertension.  - Will need a titration study prior to discharge to determine O2 need with activity  COPD:  - Not an cute exacerbation, no steroids  - Bronchodilators as ordered  Pulmonary HTN:  - VQ scan ordered for chronic thrombotic disease  - Auto-imune panel as ordered  - Advanced heart failure team following  Tobacco abuse:  - Smoking cessation is a must at this point  Asbestosis and concern for a lung mass:  - NEeds PET scan as outpatient and f/u with PCCM upon discharge  PCCM will continue to follow.  Patient seen and examined, agree with above note.  I dictated the care and orders written for this patient under my direction.  Alyson Reedy, MD [phone]

## 2018-03-05 NOTE — Consult Note (Addendum)
Advanced Heart Failure Team Consult Note   Primary Physician: Dettinger, Elige Radon, MD Primary Cardiologist:  No primary care provider on file.  Reason for Consultation: Acute systolic HF  HPI:    Zachary [name] is seen today for evaluation of Acute systolic HF at the request of Dr Tat.  Zachary [name] is a 67 y.o. male with a history of COPD, HTN, and chronic RUL infiltrate.  He was seen by his PCP, Dr. Louanne Skye on 02/21/2018 with complaints of SOB and BLE edema x1 month. He was treated for COPD exacerbation with prednisone and azithromycin and a referral was made to cardiology.   He was seen again in clinic on 02/28/2018 with Dr Dettinger. His SOB and BLE edema had gotten worse over the last 2 weeks. O2 sats 81% on RA. An echo and BLE ultrasound were ordered.   He was sent to the [facility] from echo with sats in 70s, tachycardic 110s on 3/13.  He was transferred from [facility] to [facility] for possible L/RHC.  He states that he has been SOB for a long time, but noticed worsening SOB with exertion in December. He had a stomach bug in January and feels he got worse after that. The lower extremity edema started 3 weeks ago. +orthopnea and PND. No cough, fever, or chills. No CP or dizziness. He has been diuresing with 40 mg IV lasix with brisk urine output. He reports never feeling very badly. He feels about the same this morning, but noticed that he can walk further without getting SOB and his edema has resolved. He wants to go home tomorrow.  Pertinent admission labs include: Na 138, K 4.0, creatinine 1.21, troponin <0.03 x3, BNP 421, WBC 8.1, d-dimer 0.52 CXR: COPD BLE ultrasound 02/28/2018: negative for DVT  Lives with wife in [place]. No issues with transportation or getting medications. He manages his own medications. No alcohol or drug use. He is a former smoker with 50 pack year history. He says he quit a few months ago, but "cheats" sometimes. No family history of CAD of CHF.  CTA  Chest 03/01/2018: 1. No pulmonary embolism. 2. Mild cardiomegaly with enlarged right cardiac chambers. Contrast reflux into the IVC and hepatic veins. Findings suggest right heart failure. 3. Stable dilated main pulmonary artery, suggesting chronic pulmonary arterial hypertension. 4. Trace dependent right pleural effusion. 5. Stable bandlike subpleural consolidation, volume loss, distortion and bronchiectasis in the peripheral right upper lobe. Morphologically, this finding is most compatible with pleural-parenchymal scarring such as due to chronic granulomatous infection. Continued chest CT surveillance warranted in 6-12 months. 6. Moderate to severe emphysema with mild diffuse bronchial wall thickening, suggesting COPD.  Echo 02/28/2018: - Left ventricle: The cavity size was normal. There was mild focal   basal hypertrophy of the septum. Systolic function was mildly to   moderately reduced. The estimated ejection fraction was in the   range of 40% to 45%. LVEF and wall motion difficult to assess in   setting of tachycardia and abnormal septal motion. Possible   hypokinesis of the inferior and apical myocardium. The study is   not technically sufficient to allow evaluation of LV diastolic   function. - Ventricular septum: The contour showed diastolic flattening and   systolic flattening consistent with right ventricular volume and   pressure overload. - Aortic valve: Mildly calcified annulus. Trileaflet. - Mitral valve: There was trivial regurgitation. - Right ventricle: The cavity size was severely dilated. Systolic   function was moderately reduced.  Possible McConnell&'s sign,   pulmonary embolus should be considered. - Right atrium: The atrium was severely dilated. Cannot exclude   thrombi within right atrium, echodensities seen intermittently,   although with image dropout due to respiratory motion. Central   venous pressure (est): 15 mm Hg. - Tricuspid valve: There was  moderate-severe regurgitation. - Pulmonary arteries: Systolic pressure was severely increased. PA   peak pressure: 84 mm Hg (S). - Pericardium, extracardiac: A small pericardial effusion was   identified posterior to the heart.  Review of Systems: [y] = yes, [ ]  = no   General: Weight gain [place] ]; Weight loss [ ] ; Anorexia [ ] ; Fatigue [ ] ; Fever [ ] ; Chills [ ] ; Weakness [ ]   Cardiac: Chest pain/pressure [ ] ; Resting SOB [ ] ; Exertional SOB [place] ]; Orthopnea [ y]; Pedal Edema [ y]; Palpitations [ ] ; Syncope [ ] ; Presyncope [ ] ; Paroxysmal nocturnal dyspnea[y ]  Pulmonary: Cough [ ] ; Wheezing[ ] ; Hemoptysis[ ] ; Sputum [ ] ; Snoring [ ]   GI: Vomiting[ ] ; Dysphagia[ ] ; Melena[ ] ; Hematochezia [ ] ; Heartburn[ ] ; Abdominal pain [ ] ; Constipation [ ] ; Diarrhea [ ] ; BRBPR [ ]   GU: Hematuria[ ] ; Dysuria [ ] ; Nocturia[ ]   Vascular: Pain in legs with walking [ ] ; Pain in feet with lying flat [ ] ; Non-healing sores [ ] ; Stroke [ ] ; TIA [ ] ; Slurred speech [ ] ;  Neuro: Headaches[ ] ; Vertigo[ ] ; Seizures[ ] ; Paresthesias[ ] ;Blurred vision [ ] ; Diplopia [ ] ; Vision changes [ ]   Ortho/Skin: Arthritis [ ] ; Joint pain [ ] ; Muscle pain [ ] ; Joint swelling [ ] ; Back Pain [ ] ; Rash [ ]   Psych: Depression[ ] ; Anxiety[ ]   Heme: Bleeding problems [ ] ; Clotting disorders [ ] ; Anemia [ ]   Endocrine: Diabetes [ ] ; Thyroid dysfunction[ ]   Home Medications Prior to Admission medications   Medication Sig Start Date End Date Taking? Authorizing Provider  albuterol (PROVENTIL HFA;VENTOLIN HFA) 108 (90 Base) MCG/ACT inhaler Inhale 2 puffs into the lungs every 6 (six) hours as needed for wheezing or shortness of breath. 02/21/18  Yes Dettinger, Elige Radon, MD  budesonide-formoterol (SYMBICORT) 160-4.5 MCG/ACT inhaler Inhale 2 puffs into the lungs 2 (two) times daily. 02/21/18  Yes Dettinger, Elige Radon, MD  hydrochlorothiazide (MICROZIDE) 12.5 MG capsule Take 1 capsule (12.5 mg total) daily by mouth. 11/03/17  Yes Dettinger, Elige Radon,  MD  hydroxypropyl methylcellulose / hypromellose (ISOPTO TEARS / GONIOVISC) 2.5 % ophthalmic solution Place 1 drop into both eyes as needed for dry eyes.   Yes [provider]  Naproxen Sod-Diphenhydramine (ALEVE PM) 220-25 MG TABS Take 0.5 tablets by mouth at bedtime as needed (sleep).   Yes [provider]  omeprazole (PRILOSEC) 40 MG capsule TAKE 1 CAPSULE (40 MG TOTAL) BY MOUTH DAILY. 11/03/17  Yes Dettinger, Elige Radon, MD  predniSONE (DELTASONE) 20 MG tablet Take 3 tabs daily for 1 week, then 2 tabs daily for week 2, then 1 tab daily for week 3. Patient taking differently: Take 20 mg by mouth daily as needed (for appetite).  02/21/18  Yes Dettinger, Elige Radon, MD    Past Medical History: Past Medical History:  Diagnosis Date  . COPD, severe (HCC)   . Essential hypertension   . GERD (gastroesophageal reflux disease)   . History of asbestos exposure   . Mass of upper lobe of right lung    Biopsy shows atypical cells but no definite tumor - followed by Dr. Dorris Fetch    Past  Surgical History: Past Surgical History:  Procedure Laterality Date  . VIDEO BRONCHOSCOPY Bilateral 12/07/2016   Procedure: VIDEO BRONCHOSCOPY WITH FLUORO;  Surgeon: Nyoka Cowden, MD;  Location: [facility];  Service: Cardiopulmonary;  Laterality: Bilateral;    Family History: Family History  Problem Relation Age of Onset  . Heart disease Father     Social History: Social History   Socioeconomic History  . Marital status: Married    Spouse name: None  . Number of children: None  . Years of education: None  . Highest education level: None  Social Needs  . Financial resource strain: None  . Food insecurity - worry: None  . Food insecurity - inability: None  . Transportation needs - medical: None  . Transportation needs - non-medical: None  Occupational History  . None  Tobacco Use  . Smoking status: Former Smoker    Packs/day: 0.10    Types: Cigarettes    Start date: 01/27/1974     Last attempt to quit: 05/19/2017    Years since quitting: 0.7  . Smokeless tobacco: Never Used  Substance and Sexual Activity  . Alcohol use: No  . Drug use: No  . Sexual activity: None  Other Topics Concern  . None  Social History Narrative  . None    Allergies:  Allergies  Allergen Reactions  . Aspirin Other (See Comments)    Stomach burning     Objective:    Vital Signs:   Temp:  [97.9 F (36.6 C)-98 F (36.7 C)] 97.9 F (36.6 C) (03/18 0525) Pulse Rate:  [112-119] 115 (03/18 0525) Resp:  [20] 20 (03/18 0525) BP: (103-106)/(76-86) 106/86 (03/18 0525) SpO2:  [92 %-95 %] 95 % (03/18 0525) FiO2 (%):  [28 %] 28 % (03/17 0841) Weight:  [136 lb 6.4 oz (61.9 kg)] 136 lb 6.4 oz (61.9 kg) (03/18 0525) Last BM Date: 03/04/18  Weight change: Filed Weights   03/03/18 0315 03/04/18 0531 03/05/18 0525  Weight: 139 lb 14.4 oz (63.5 kg) 136 lb 11.2 oz (62 kg) 136 lb 6.4 oz (61.9 kg)    Intake/Output:   Intake/Output Summary (Last 24 hours) at 03/05/2018 0807 Last data filed at 03/05/2018 0656 Gross per 24 hour  Intake 969.5 ml  Output 2075 ml  Net -1105.5 ml      Physical Exam    General:  Well appearing. No resp difficulty HEENT: normal Neck: supple. JVP ~8. Carotids 2+ bilat; no bruits. No lymphadenopathy or thyromegaly appreciated. Cor: PMI nondisplaced. Regular rate & rhythm. No rubs, gallops or murmurs. Lungs: diminished throughout. On 2 L [place] Abdomen: soft, nontender, nondistended. No hepatosplenomegaly. No bruits or masses. Good bowel sounds. Extremities: no cyanosis, clubbing, rash, edema Neuro: alert & orientedx3, cranial nerves grossly intact. moves all 4 extremities w/o difficulty. Affect pleasant   Telemetry   ST 110-120s. Personally reviewed.   EKG    EKG 02/28/2018: ST 106, left atrial enlargement and right ventricular hypertrophy  Labs   Basic Metabolic Panel: Recent Labs  Lab 03/01/18 0338 03/02/18 0726 03/03/18 1317 03/04/18 0336  03/05/18 0255  NA 138 137 135 136 136  K 4.0 3.5 3.5 3.3* 3.8  CL 104 96* 92* 97* 99*  CO2 25 30 30 30 30   GLUCOSE 123* 103* 109* 88 101*  BUN 26* 30* 27* 29* 21*  CREATININE 1.21 1.43* 1.37* 1.24 1.26*  CALCIUM 8.9 9.1 8.9 8.6* 8.5*  MG  --  1.8  --   --   --  Liver Function Tests: No results for input(s): AST, ALT, ALKPHOS, BILITOT, PROT, ALBUMIN in the last 168 hours. No results for input(s): LIPASE, AMYLASE in the last 168 hours. No results for input(s): AMMONIA in the last 168 hours.  CBC: Recent Labs  Lab 02/28/18 1840 03/04/18 0336 03/05/18 0255  WBC 8.1 8.8 8.3  NEUTROABS 4.6  --   --   HGB 13.7 14.4 14.7  HCT 43.4 43.2 44.4  MCV 87.7 84.9 86.4  PLT 247 229 229    Cardiac Enzymes: Recent Labs  Lab 02/28/18 1912 02/28/18 2145 03/01/18 0338 03/01/18 0927  TROPONINI 0.03* <0.03 <0.03 <0.03    BNP: BNP (last 3 results) Recent Labs    02/28/18 1912  BNP 421.0*    ProBNP (last 3 results) No results for input(s): PROBNP in the last 8760 hours.   CBG: No results for input(s): GLUCAP in the last 168 hours.  Coagulation Studies: Recent Labs    03/05/18 0255  LABPROT 15.8*  INR 1.27     Imaging    No results found.   Medications:     Current Medications: . furosemide  40 mg Intravenous Daily  . hydrALAZINE  25 mg Oral Q8H  . ipratropium-albuterol  3 mL Nebulization BID  . mouth rinse  15 mL Mouth Rinse BID  . mometasone-formoterol  2 puff Inhalation BID  . pantoprazole  40 mg Oral Daily  . sodium chloride flush  3 mL Intravenous Q12H  . sodium chloride flush  3 mL Intravenous Q12H     Infusions: . sodium chloride    . sodium chloride    . sodium chloride 10 mL/hr at 03/05/18 0559       Patient Profile   Zachary [name] is a 67 y.o. male with a history of COPD, HTN, and chronic RUL infiltrate.  Admitted for evaluation and treatment of acute respiratory failure with hypoxia.  Assessment/Plan   1. Acute  respiratory failure with hypoxia - Improved with diuresis - CTA negative for PE - Still requiring 2 L O2. Will need to walk to see if he will need O2 at home.  - Will need outpatient sleep study  2. Acute systolic heart failure. New, unknown etiology.   - Echo 02/28/2018: EF 40-45%, ?hypokinesis of inferior and apical myocardium, severe dilation of RV with moderate systolic dysfunction, severe dilation of right atrium, mod-severe TR, PA peak pressure 84, small pericardial effusion.  - Volume status stable. May be able to decrease lasix. He already received today. Will see what RHC shows - Down 15 lbs from admit with 40 mg IV lasix daily.   - No BB with acute decompensation - Start ARB and/or spiro after cath. Not started previously with AKI.  - Plan for R/L heart cath today - Send for SPEP/UPEP  3. COPD - On 2 L [place] - Former smoker with 50 pack year history  4. AKI on CKD, baseline creatinine 1.1-1.2 - Monitor daily BMET with diuresis - Creatinine 1.2 today  5. HTN - Continue hydral 25 mg TID. May need to cut back to add ARB/spiro tomorrow.   6. Pulmonary Hypertension - PA pressures >80 on echo - RHC today - Will need outpatient sleep study  Medication concerns reviewed with patient and pharmacy team. Barriers identified: none  Length of Stay: 5  Alford Highland, NP 03/05/18, 8:07 AM  Advanced Heart Failure Team Pager [phone] (M-F; 7a - 4p)  Please contact [facility] for night-coverage after hours (4p -7a )  and weekends on [email or web address]  Patient seen with NP, agree with the above note.    He was admitted with dyspnea, volume overload.  Suspect right-sided heart failure.  He has diuresed well with IV Lasix this admission.  CT chest showed no PE but did show significant emphysema and RUL bronchiectasis.  Echo showed RV failure with LV EF 40-45%.    On exam, he still has JVD.  Mildly tachy, regular rhythm.  No peripheral edema.   1. Acute on chronic systolic CHF: With  prominent RV failure.  Echo with LV EF 40-45%, RV severely dilated with moderately decreased systolic function.  Suspect RV dysfunction may be related to severe COPD.  On exam, he still appears to have some volume overload.   - Left and right heart catheterization today, discussed risks/benefits with patient and he agrees to proceed. Will assess for coronary disease as cause for fall in LV EF.  - Will decide on further Lasix dosing after RHC.  - Will stop hydralazine and start losartan 25 mg daily.  2. COPD: Prior smoker.  Moderate to severe emphysema on CT chest.  Suspect this may be the cause of RV failure.  - Would be reasonable to involve pulmonary.  - May need home oxygen.  3. Pulmonary hypertension: Suspect combination group 2 (elevated LA pressure) and group 3 (lung parenchymal disease).  - Will assess by RHC today but doubt he would benefit from pulmonary vasodilators.   Marca Ancona 03/05/2018 1:03 PM

## 2018-03-05 NOTE — Care Management Important Message (Signed)
Important Message  Patient Details  Name: Zachary [name] MRN: [id number] Date of Birth: October 30, 1951   Medicare Important Message Given:  Yes    Ahmani Daoud P Brenn Deziel 03/05/2018, 1:47 PM

## 2018-03-06 DIAGNOSIS — J438 Other emphysema: Secondary | ICD-10-CM

## 2018-03-06 LAB — PROTEIN ELECTROPHORESIS, SERUM
A/G Ratio: 0.8 (ref 0.7–1.7)
ALBUMIN ELP: 3.1 g/dL (ref 2.9–4.4)
ALPHA-1-GLOBULIN: 0.3 g/dL (ref 0.0–0.4)
Alpha-2-Globulin: 0.9 g/dL (ref 0.4–1.0)
BETA GLOBULIN: 1.1 g/dL (ref 0.7–1.3)
GAMMA GLOBULIN: 1.6 g/dL (ref 0.4–1.8)
Globulin, Total: 3.8 g/dL (ref 2.2–3.9)
TOTAL PROTEIN ELP: 6.9 g/dL (ref 6.0–8.5)

## 2018-03-06 LAB — LIPID PANEL
Cholesterol: 136 mg/dL (ref 0–200)
HDL: 50 mg/dL (ref >40)
LDL Cholesterol: 73 mg/dL (ref 0–99)
Total CHOL/HDL Ratio: 2.7 RATIO
Triglycerides: 65 mg/dL (ref <150)
VLDL: 13 mg/dL (ref 0–40)

## 2018-03-06 LAB — BASIC METABOLIC PANEL
Anion gap: 11 (ref 5–15)
BUN: 21 mg/dL — AB (ref 6–20)
CALCIUM: 8.8 mg/dL — AB (ref 8.9–10.3)
CHLORIDE: 97 mmol/L — AB (ref 101–111)
CO2: 27 mmol/L (ref 22–32)
CREATININE: 1.28 mg/dL — AB (ref 0.61–1.24)
GFR calc non Af Amer: 56 mL/min — ABNORMAL LOW (ref >60)
Glucose, Bld: 100 mg/dL — ABNORMAL HIGH (ref 65–99)
Potassium: 4.4 mmol/L (ref 3.5–5.1)
Sodium: 135 mmol/L (ref 135–145)

## 2018-03-06 LAB — CBC
HCT: 46.5 % (ref 39.0–52.0)
HEMOGLOBIN: 15.2 g/dL (ref 13.0–17.0)
MCH: 28.5 pg (ref 26.0–34.0)
MCHC: 32.7 g/dL (ref 30.0–36.0)
MCV: 87.2 fL (ref 78.0–100.0)
Platelets: 220 10*3/uL (ref 150–400)
RBC: 5.33 MIL/uL (ref 4.22–5.81)
RDW: 15.1 % (ref 11.5–15.5)
WBC: 8.5 10*3/uL (ref 4.0–10.5)

## 2018-03-06 LAB — SEDIMENTATION RATE: Sed Rate: 35 mm/hr — ABNORMAL HIGH (ref 0–16)

## 2018-03-06 LAB — HIV ANTIBODY (ROUTINE TESTING W REFLEX): HIV Screen 4th Generation wRfx: NONREACTIVE

## 2018-03-06 MED ORDER — FUROSEMIDE 20 MG PO TABS: 20.0000 mg | ORAL_TABLET | Freq: Every day | ORAL | 3 refills | Status: DC

## 2018-03-06 MED ORDER — ATORVASTATIN CALCIUM 10 MG PO TABS: 10.0000 mg | ORAL_TABLET | Freq: Every day | ORAL | Status: DC

## 2018-03-06 MED ORDER — ATORVASTATIN CALCIUM 20 MG PO TABS: 20.0000 mg | ORAL_TABLET | Freq: Every day | ORAL | Status: DC

## 2018-03-06 MED ORDER — ATORVASTATIN CALCIUM 10 MG PO TABS: 10.0000 mg | ORAL_TABLET | Freq: Every day | ORAL | 3 refills | Status: AC

## 2018-03-06 MED ORDER — BISOPROLOL FUMARATE 5 MG PO TABS
2.5000 mg | ORAL_TABLET | Freq: Every day | ORAL | Status: DC
  Filled 2018-03-06 (×2): qty 1

## 2018-03-06 MED ORDER — ASPIRIN 81 MG PO CHEW: 81.0000 mg | CHEWABLE_TABLET | Freq: Every day | ORAL | 3 refills | Status: AC

## 2018-03-06 MED ORDER — BISOPROLOL FUMARATE 5 MG PO TABS: 2.5000 mg | ORAL_TABLET | Freq: Every day | ORAL | 3 refills | Status: DC

## 2018-03-06 MED ORDER — LOSARTAN POTASSIUM 25 MG PO TABS: 25.0000 mg | ORAL_TABLET | Freq: Every day | ORAL | 3 refills | Status: DC

## 2018-03-06 MED FILL — Lidocaine HCl Local Inj 1%: INTRAMUSCULAR | Qty: 20 | Status: AC

## 2018-03-06 NOTE — Progress Notes (Signed)
SATURATION QUALIFICATIONS: (This note is used to comply with regulatory documentation for home oxygen)  Patient Saturations on Room Air at Rest = 82%  Patient Saturations on Room Air while Ambulating = 82%  Patient Saturations on 4 Liters of oxygen while Ambulating = 90%  Please briefly explain why patient needs home oxygen: Pt saturation drops on RA at rest to 82 %.  Also, O2 sat drops to 85% on 2L while eating.  Hinton Dyer, RN

## 2018-03-06 NOTE — Progress Notes (Signed)
PT Cancellation Note  Patient Details Name: Zachary [name] MRN: [id number] DOB: [birth year]   Cancelled Treatment:    Reason Eval/Treat Not Completed: PT screened, no needs identified, will sign off; patient prepped for d/c, reports walked earlier with someone checking vitals and did well.  Noted PT evaluation from 3/14 with no further PT needs.  Will sign off due to pt reports no needs at this time.    Elray Mcgregor 03/06/2018, 12:45 PM Sheran Lawless, PT [phone] 03/06/2018

## 2018-03-06 NOTE — Progress Notes (Addendum)
Advanced Heart Failure Rounding Note  PCP-Cardiologist: No primary care provider on file.   Subjective:    -1 L with 40 mg IV lasix yesterday. Weight is down another 4 lbs. Filling pressures low on RHC. Creatinine stable.   No CP, SOB, or dizziness.   R/LHC 03/05/2018: 1. Low right and left heart filling pressures.  2. Moderate pulmonary arterial hypertension, likely group 3 PH related to severe COPD.  3. Nonobstructive coronary disease.    Objective:   Weight Range: 132 lb 3.2 oz (60 kg) Body mass index is 19.52 kg/m.   Vital Signs:   Temp:  [97.9 F (36.6 C)-98.7 F (37.1 C)] 98.7 F (37.1 C) (03/19 0602) Pulse Rate:  [109-125] 111 (03/19 0602) Resp:  [10-23] 17 (03/19 0602) BP: (88-122)/(76-88) 105/77 (03/19 0602) SpO2:  [87 %-95 %] 90 % (03/19 0816) Weight:  [132 lb 3.2 oz (60 kg)] 132 lb 3.2 oz (60 kg) (03/19 0602) Last BM Date: 03/04/18  Weight change: Filed Weights   03/04/18 0531 03/05/18 0525 03/06/18 0602  Weight: 136 lb 11.2 oz (62 kg) 136 lb 6.4 oz (61.9 kg) 132 lb 3.2 oz (60 kg)    Intake/Output:   Intake/Output Summary (Last 24 hours) at 03/06/2018 0902 Last data filed at 03/06/2018 0500 Gross per 24 hour  Intake 1235.83 ml  Output 1650 ml  Net -414.17 ml      Physical Exam    General:  Well appearing. No resp difficulty HEENT: Normal Neck: Supple. JVP 5-6. Carotids 2+ bilat; no bruits. No lymphadenopathy or thyromegaly appreciated. Cor: PMI nondisplaced. Regular rate & rhythm. No rubs, gallops or murmurs. Lungs: RLL decreased breath sounds. Abdomen: Soft, nontender, nondistended. No hepatosplenomegaly. No bruits or masses. Good bowel sounds. Extremities: No cyanosis, clubbing, rash, edema Neuro: Alert & orientedx3, cranial nerves grossly intact. moves all 4 extremities w/o difficulty. Affect pleasant   Telemetry   ST 110's. Personally reviewed.   EKG    No new tracings.   Labs    CBC Recent Labs    03/05/18 0255  03/06/18 0401  WBC 8.3 8.5  HGB 14.7 15.2  HCT 44.4 46.5  MCV 86.4 87.2  PLT 229 220   Basic Metabolic Panel Recent Labs    16/10/96 0255 03/06/18 0401  NA 136 135  K 3.8 4.4  CL 99* 97*  CO2 30 27  GLUCOSE 101* 100*  BUN 21* 21*  CREATININE 1.26* 1.28*  CALCIUM 8.5* 8.8*   Liver Function Tests No results for input(s): AST, ALT, ALKPHOS, BILITOT, PROT, ALBUMIN in the last 72 hours. No results for input(s): LIPASE, AMYLASE in the last 72 hours. Cardiac Enzymes No results for input(s): CKTOTAL, CKMB, CKMBINDEX, TROPONINI in the last 72 hours.  BNP: BNP (last 3 results) Recent Labs    02/28/18 1912  BNP 421.0*    ProBNP (last 3 results) No results for input(s): PROBNP in the last 8760 hours.   D-Dimer No results for input(s): DDIMER in the last 72 hours. Hemoglobin A1C No results for input(s): HGBA1C in the last 72 hours. Fasting Lipid Panel Recent Labs    03/06/18 0401  CHOL 136  HDL 50  LDLCALC 73  TRIG 65  CHOLHDL 2.7   Thyroid Function Tests No results for input(s): TSH, T4TOTAL, T3FREE, THYROIDAB in the last 72 hours.  Invalid input(s): FREET3  Other results:   Imaging    Dg Chest 2 View  Result Date: 03/05/2018 CLINICAL DATA:  Shortness of breath and cough, pulmonary  hypertension, COPD, CHF, cor pulmonale EXAM: CHEST - 2 VIEW COMPARISON:  02/28/2018, 02/06/2017, 12/07/2016 FINDINGS: Enlargement of cardiac silhouette. Mediastinal contours and pulmonary vascularity normal. Again identified emphysematous and minimal central bronchitic changes consistent with COPD. Interstitial fibrosis at lung bases, chronic and unchanged. Pleuroparenchymal opacity with volume loss in the RIGHT upper lobe laterally unchanged since previous exam. No acute infiltrate, pleural effusion or pneumothorax. Bones unremarkable. IMPRESSION: Changes of COPD, RIGHT upper lobe scarring/volume loss, and bibasilar interstitial fibrosis which appear grossly stable since prior exam.  No new abnormalities. Electronically Signed   By: Ulyses Southward M.D.   On: 03/05/2018 16:45   Nm Pulmonary Perf And Vent  Result Date: 03/05/2018 CLINICAL DATA:  COPD, CHF, cor pulmonale, pulmonary hypertension, question chronic pulmonary embolism EXAM: NUCLEAR MEDICINE VENTILATION - PERFUSION LUNG SCAN TECHNIQUE: Ventilation images were obtained in multiple projections using inhaled aerosol Tc-57m DTPA. Perfusion images were obtained in multiple projections after intravenous injection of Tc-52m-MAA. RADIOPHARMACEUTICALS:  30 mCi of Tc-53m DTPA aerosol inhalation and 4.1 mCi Tc59m-MAA IV COMPARISON:  None Correlation: Chest radiograph 03/05/2018 FINDINGS: Ventilation: Impaired peripheral ventilation throughout the upper lobes with multiple small to moderate-sized subsegmental ventilatory defects especially RIGHT upper lobe. Less severely impaired peripheral ventilation in the lower lobes greater on LEFT. Perfusion: Matching subsegmental perfusion defects in the periphery of the RIGHT upper lobe. Better perfusion than ventilation in LEFT upper lobe. Fewer perfusion defects in the periphery of the lower lobes. Chest radiograph: Severe COPD changes. RIGHT upper lobe opacity and volume loss with peripheral scarring versus mass. Enlargement of cardiac silhouette. IMPRESSION: Overall pattern of ventilation and perfusion is most consistent with COPD and RIGHT upper lobe scarring/volume loss. Observed RIGHT upper lobe changes have radiographically been present since 12/07/2016. Findings represent a low probability for pulmonary embolism. Electronically Signed   By: Ulyses Southward M.D.   On: 03/05/2018 16:43     Medications:     Scheduled Medications: . aspirin  81 mg Oral Daily  . furosemide  20 mg Oral Daily  . heparin  5,000 Units Subcutaneous Q8H  . losartan  25 mg Oral Daily  . mouth rinse  15 mL Mouth Rinse BID  . mometasone-formoterol  2 puff Inhalation BID  . pantoprazole  40 mg Oral Daily  . sodium  chloride flush  3 mL Intravenous Q12H  . sodium chloride flush  3 mL Intravenous Q12H    Infusions: . sodium chloride    . sodium chloride      PRN Medications: sodium chloride, sodium chloride, acetaminophen, ipratropium-albuterol, ondansetron (ZOFRAN) IV, polyvinyl alcohol, sodium chloride flush, sodium chloride flush    Patient Profile   Zachary [name] is a 67 y.o. male with a history of COPD, HTN, and chronic RUL infiltrate.  Admitted for evaluation and treatment of acute respiratory failure with hypoxia.  Assessment/Plan   1. Acute respiratory failure with hypoxia - Improved with diuresis - CTA negative for PE - Still requiring 2 L O2. Will need to walk to see if he requires O2 for home - Will need outpatient sleep study  2. Acute systolic heart failure. New, unknown etiology.   - Echo 02/28/2018: EF 40-45%, ?hypokinesis of inferior and apical myocardium, severe dilation of RV with moderate systolic dysfunction, severe dilation of right atrium, mod-severe TR, PA peak pressure 84, small pericardial effusion. Low filling pressures with moderate PAH on RHC 3/18. LHC showed nonobstructive CAD.  - Volume status stable. Started on 20 mg PO lasix today.  - Down 15  lbs from admit with 40 mg IV lasix daily.   - No BB with acute decompensation - Continue losartan 25 mg daily - Consider adding spiro outpatient. - SPEP/UPEP pending - Advised to avoid NSAIDs  3. COPD - On 2 L  - Former smoker with 50 pack year history  4. AKI on CKD, baseline creatinine 1.1-1.2 - Monitor daily BMET with diuresis - Creatinine 1.28, K 4.4  5. HTN - Well controlled with losartan  6. Pulmonary arterial hypertension, moderate - PA pressures >80 on echo - RHC 3/18:  Moderate pulmonary arterial hypertension, likely group 3 PH related to severe COPD. Dr Shirlee Latch advised not to treat with pulmonary vasodilators. - Will need outpatient sleep study - He will follow up with pulmonary  outpatient  7. Nonobstructive CAD on LHC - Continue ASA. Start atorvastatin today. LDL 73 this am.  - No s/s ischemia  Medication concerns reviewed with patient and pharmacy team. Barriers identified: none  HF follow up has been scheduled.   Length of Stay: 6  Alford Highland, NP  03/06/2018, 9:02 AM  Advanced Heart Failure Team Pager [phone] (M-F; 7a - 4p)  Please contact [facility] for night-coverage after hours (4p -7a ) and weekends on [email or web address]  Patient seen with NP, agree with the above note. He feels good today, no dyspnea wearing oxygen.  Ready to go home.  Not volume overloaded on exam.   1. Acute on chronic systolic CHF: With prominent RV failure.  Echo with LV EF 40-45%, RV severely dilated with moderately decreased systolic function.  Suspect RV dysfunction may be related to severe COPD.  Not volume overloaded by exam or RHC yesterday.  Nonobstructive CAD, suspect nonischemic cardiomyopathy.  - Lasix 20 mg daily. - losartan 25 mg daily.  - Will add low dose bisoprolol 2.5 mg daily (beta-1 selective with severe COPD).  2. COPD: Prior smoker.  Moderate to severe emphysema on CT chest.  Suspect this may be the cause of RV failure. It appears that he is going to need home oxygen.  - Would be reasonable to involve pulmonary (can see as outpatient).  - Will need walk test to officially qualify for home oxygen.   3. Pulmonary hypertension: Suspect primarily group 3 (lung parenchymal disease => COPD). He is unlikely to benefit from selective pulmonary vasodilators.  I think that at this point, home oxygen is going to be the main treatment.  4. CAD: Nonobstructive.  Would use ASA 81 and low dose statin.   Cardiac meds for home: ASA 81, atorvastatin 10 daily, bisoprolol 2.5 daily, losartan 25 daily, Lasix 20 mg daily.  Will arrange CHF clinic followup.   Marca Ancona 03/06/2018 9:28 AM

## 2018-03-06 NOTE — Care Management Note (Signed)
Case Management Note  Patient Details  Name: Zachary [name] MRN: [id number] Date of Birth: November 22, 1951  Subjective/Objective:   Pt presented for CHF Exacerbation. Plan for home with 02 2/2 COPD. PTA from home with wife.                  Action/Plan: Pt in need of home 02. Pt lives in [place]. Pt is ok with [facility] to deliver portable e tank to hospital and then the stationary tank to be delivered to home. [facility] will have Telephonic Services at this time to follow up with patient. No further needs identified by CM at this time.   Expected Discharge Date:  03/06/18               Expected Discharge Plan:  Home/Self Care  In-House Referral:  [facility](Telephonic via [facility] to call once at home. )  Discharge planning Services  CM Consult  Post Acute Care Choice:  Durable Medical Equipment Choice offered to:  Patient  DME Arranged:  Oxygen DME Agency:  [facility]  HH Arranged:  NA HH Agency:  NA  Status of Service:  Completed, signed off  If discussed at [employer] of Stay Meetings, dates discussed:    Additional Comments:  Gala Lewandowsky, RN 03/06/2018, 12:49 PM

## 2018-03-06 NOTE — Consult Note (Signed)
[facility] CM Primary Care Navigator  03/06/2018  CARL BUTNER 09-04-1951 [id number]   Met with patientat the bedside to identify possible discharge needs. Patient reportshaving"shortness of breath, increased swelling to both lower extremities and decreased oxygen level to 70's " which all resultedto this admission.  Patient endorsesDr. Vonna Kotyk Dettinger with [facility]) as his primary care provider.   [facility] [place] [place] [facility].   Patientreportsmanaginghis ownmedications at [employer] out of the containers ("not taking much"), but verbalized plan to use "pill box" system if needed upon discharge.  Patientverbalizedthathe has been driving prior to admission but wife Lelon Frohlich) will be able toprovidetransportation tohis doctors'appointments after discharge.  Patient statesthathe lives with wife who will be the primary caregiver at home.  Anticipated discharge plan ishomeper patient.  Patientvoiced understanding to call primary care provider's office when he gets home for a post discharge follow-up appointment within1- 2 weeksor sooner if needs arise.Patient letter (with PCP's contact number) wasprovided as a reminder.  Per MD note, patient has history significant for COPD, hypertension, GERD. He presented with one month history of shortness of breath and worsening lower extremity edema. He was seen and treated with Symbicort, Albuterol, Prednisone, and Azithromycin by primary care provider but symptoms did not improve and became tachycardic with oxygen saturations in the 70's. He was recommended to go to the ED-- he complained of orthopnea, dyspnea on exertion; smoked tobacco heavily but quit 2 years ago. Patient was transferred to [facility] due to congestive HF and was diuresed with IV Lasix, with oxygen use(possibly needing at  home).  Patient admits lack of knowledge in managing HF and not aware of HF zones/ tool. Patient has minimal recall of signs and symptoms of HF that needs medical assistance.He verbalized not having a weighing scale but can obtain one. He is unaware of diet restrictions (low salt). Patient is expecting to be on more medications when he goes home.  Explained to patientregarding[facility] CM services available for healthmanagement at home.Patient expressed willingness to have phone calls as opposed to home visits to help manage health issues when discharged. Besides, patient lives in [place], [place].  Patientverbally agreed and opted for referralto [facility] Telephonic care managementforfurther assessment of needs and provide information/ health education on ways to manage HF.  Referral made to [place] care managementcoordinator forfollow-up of needs after discharge, provide information/ health education and reinforce disease management of heart failure and other chronic illnesses (COPD) at home.  [facility]care managementinformation provided for future needs that may arise.   For additional questions please contact:  Edwena Felty A. Brittin Belnap, BSN, RN-BC [facility] PRIMARY CARE Navigator Cell: [phone]

## 2018-03-06 NOTE — Discharge Summary (Signed)
Physician Discharge Summary   Patient ID: Zachary [name] MRN: [id number] DOB/AGE: 1951/03/22 67 y.o.  Admit date: 02/28/2018 Discharge date: 03/06/2018  Primary Care Physician:  Dettinger, Elige Radon, MD   Recommendations for Outpatient Follow-up:  1. Follow up with PCP in 1-2 weeks 2. Please obtain BMP/CBC in one week  Home Health: None Equipment/Devices: Likely will need home O2, home O2 evaluation pending  Discharge Condition: stable  CODE STATUS:  DNR   Diet recommendation: Heart healthy diet   Discharge Diagnoses:   Acute systolic CHF with right heart failure Pulmonary hypertension with cor pulmonale Acute respiratory failure with hypoxia . Essential hypertension, benign . Emphysema of lung (HCC) Mild acute on chronic kidney disease stage III  Consults:  Cardiology/CHF team   Pulmonology    Allergies:   Allergies  Allergen Reactions  . Aspirin Other (See Comments)    Stomach burning      DISCHARGE MEDICATIONS: Allergies as of 03/06/2018      Reactions   Aspirin Other (See Comments)   Stomach burning       Medication List    STOP taking these medications   ALEVE PM 220-25 MG Tabs Generic drug:  Naproxen Sod-diphenhydrAMINE   hydrochlorothiazide 12.5 MG capsule Commonly known as:  MICROZIDE   predniSONE 20 MG tablet Commonly known as:  DELTASONE     TAKE these medications   albuterol 108 (90 Base) MCG/ACT inhaler Commonly known as:  PROVENTIL HFA;VENTOLIN HFA Inhale 2 puffs into the lungs every 6 (six) hours as needed for wheezing or shortness of breath.   aspirin 81 MG chewable tablet Chew 1 tablet (81 mg total) by mouth daily.   atorvastatin 10 MG tablet Commonly known as:  LIPITOR Take 1 tablet (10 mg total) by mouth at bedtime.   bisoprolol 5 MG tablet Commonly known as:  ZEBETA Take 0.5 tablets (2.5 mg total) by mouth daily. Start taking on:  03/07/2018   budesonide-formoterol 160-4.5 MCG/ACT inhaler Commonly known as:   SYMBICORT Inhale 2 puffs into the lungs 2 (two) times daily.   furosemide 20 MG tablet Commonly known as:  LASIX Take 1 tablet (20 mg total) by mouth daily.   hydroxypropyl methylcellulose / hypromellose 2.5 % ophthalmic solution Commonly known as:  ISOPTO TEARS / GONIOVISC Place 1 drop into both eyes as needed for dry eyes.   losartan 25 MG tablet Commonly known as:  COZAAR Take 1 tablet (25 mg total) by mouth daily.   omeprazole 40 MG capsule Commonly known as:  PRILOSEC TAKE 1 CAPSULE (40 MG TOTAL) BY MOUTH DAILY.        Brief H and P: For complete details please refer to admission H and P, but in brief 66 year old male with a history of COPD, hypertension, GERD presenting with one month history of shortness of breath and worsening lower extremity edema. 2 weeks ago, the patient was seen by his PCP and started on Symbicort, albuterol, prednisone, and azithromycin. He was noted to have oxygen saturation of 87% on RA in the office. He followed up in the office on 02/28/2018. His symptoms did not improve and was tachycardic with oxygen sats in the 70s.  Patient was recommended to go to the ED.  Complaining of orthopnea, dyspnea on exertion for the last 2-3 weeks.  Smoked heavily, quit 2 years ago with 100-pack-year history of tobacco.  Patient was transferred to [facility] due to CHF, started on IV furosemide.  Hospital Course:  Acute systolic CHF (congestive heart  failure) (HCC) with right heart failure, pulmonary hypertension -Patient was placed on IV Lasix for diuresis and had significant improvement with symptoms and peripheral edema.   -2D echo on 02/28/18 showed EF of 40-45% with moderate to severe TR, PA pressure 84, moderate reduction in the RV function -Negative balance of 8.0 L, weight down from 151lbs on admission-> 132 lbs at discharge -Patient underwent right and left heart catheterization which showed normal right and left heart filling pressures, moderate  pulmonary arterial hypertension likely group 3 PAH related to severe COPD, nonobstructive coronary artery disease. - Home O2 evaluation at discharge -Patient was seen by CHF team, recommended aspirin 81, Lipitor 10 mg daily, bisoprolol 2.5 mg daily, losartan 25 mg daily, Lasix 20 mg daily at discharge   Acute respiratory failure with hypoxia likely due to acute systolic CHF, cor pulmonale and underlying COPD, severe pulmonary hypertension -CT angiogram of the chest negative for PE, right-sided CHF with right upper lung bronchiectasis -Continue O2, scheduled nebs, Dulera, -VQ scan was negative for chronic pulmonary embolism, showed overall pattern of ventilation perfusion most consistent with COPD and right upper lobe scarring/volume loss.    Essential hypertension, benign -Currently stable, continue hydralazine, Lasix  Mild acute on CKD (chronic kidney disease) stage 3, GFR 30-59 ml/min (HCC) -Baseline creatinine 1.1-1.3 -Creatinine stable 1.2 at discharge, continue Lasix   Day of Discharge S: Feeling better, eager to go home  BP (!) 83/63   Pulse (!) 117   Temp 98.7 F (37.1 C) (Oral)   Resp 17   Ht 5\' 9"  (1.753 m)   Wt 60 kg (132 lb 3.2 oz)   SpO2 92%   BMI 19.52 kg/m   Physical Exam: General: Alert and awake oriented x3 not in any acute distress. HEENT: anicteric sclera, pupils reactive to light and accommodation CVS: S1-S2 clear no murmur rubs or gallops Chest: Diminished breath sounds Abdomen: soft nontender, nondistended, normal bowel sounds Extremities: no cyanosis, clubbing or edema noted bilaterally Neuro: Cranial nerves II-XII intact, no focal neurological deficits   The results of significant diagnostics from this hospitalization (including imaging, microbiology, ancillary and laboratory) are listed below for reference.      Procedures/Studies:  Study Conclusions ECHO  - Left ventricle: The cavity size was normal. There was mild focal   basal  hypertrophy of the septum. Systolic function was mildly to   moderately reduced. The estimated ejection fraction was in the   range of 40% to 45%. LVEF and wall motion difficult to assess in   setting of tachycardia and abnormal septal motion. Possible   hypokinesis of the inferior and apical myocardium. The study is   not technically sufficient to allow evaluation of LV diastolic   function. - Ventricular septum: The contour showed diastolic flattening and   systolic flattening consistent with right ventricular volume and   pressure overload.  RIGHT/LEFT HEART CATH AND CORONARY ANGIOGRAPHY  Conclusion   1. Low right and left heart filling pressures.  2. Moderate pulmonary arterial hypertension, likely group 3 PH related to severe COPD.  3. Nonobstructive coronary disease.   Transition to po Lasix.   Would not treat with pulmonary vasodilators.       Dg Chest 2 View  Result Date: 03/05/2018 CLINICAL DATA:  Shortness of breath and cough, pulmonary hypertension, COPD, CHF, cor pulmonale EXAM: CHEST - 2 VIEW COMPARISON:  02/28/2018, 02/06/2017, 12/07/2016 FINDINGS: Enlargement of cardiac silhouette. Mediastinal contours and pulmonary vascularity normal. Again identified emphysematous and minimal central bronchitic changes consistent  with COPD. Interstitial fibrosis at lung bases, chronic and unchanged. Pleuroparenchymal opacity with volume loss in the RIGHT upper lobe laterally unchanged since previous exam. No acute infiltrate, pleural effusion or pneumothorax. Bones unremarkable. IMPRESSION: Changes of COPD, RIGHT upper lobe scarring/volume loss, and bibasilar interstitial fibrosis which appear grossly stable since prior exam. No new abnormalities. Electronically Signed   By: Ulyses Southward M.D.   On: 03/05/2018 16:45   Dg Chest 2 View  Result Date: 02/28/2018 CLINICAL DATA:  Shortness of breath.  Pneumonia.  Emphysema. EXAM: CHEST - 2 VIEW COMPARISON:  Most recent chest 02/21/2018.  FINDINGS: Normal heart size. Pleuroparenchymal disease in the peripheral RIGHT upper lobe with volume loss and retraction of the RIGHT hilum appears stable. No definite acute infiltrate. Chronic interstitial prominence. Emphysema. No osseous findings. IMPRESSION: COPD.  Stable pleuroparenchymal scarring. Electronically Signed   By: Elsie Stain M.D.   On: 02/28/2018 18:52   Dg Chest 2 View  Addendum Date: 02/21/2018   ADDENDUM REPORT: 02/21/2018 11:33 ADDENDUM: The patient's accounts have now been merged and prior CT scan of January 02, 2018 is now available for comparison. This study demonstrates continued stability of right upper lobe pleuroparenchymal disease compared to prior exams. Repeat chest CT is not needed at this time. Electronically Signed   By: Lupita Raider, M.D.   On: 02/21/2018 11:33   Result Date: 02/21/2018 CLINICAL DATA:  Shortness of breath. EXAM: CHEST - 2 VIEW COMPARISON:  None. FINDINGS: The heart size and mediastinal contours are within normal limits. No pneumothorax or pleural effusion is noted. Mild interstitial densities are noted in both lung bases which may represent scarring, but acute superimposed edema cannot be excluded. Significant pleural thickening is seen in the right lung apex which may simply represent scarring, but a large rounded density is seen in this area concerning for possible neoplasm. The visualized skeletal structures are unremarkable. IMPRESSION: Right apical pleural thickening is noted which may simply represent scarring. However, rounded density is seen in right lung apex as well concerning for possible neoplasm, and CT scan of the chest with intravenous contrast is recommended for further evaluation. These results will be called to the ordering clinician or representative by the Radiologist Assistant, and communication documented in the PACS or zVision Dashboard. Mild bibasilar interstitial densities are noted which may represent scarring, but acute  superimposed edema cannot be excluded. Electronically Signed: By: Lupita Raider, M.D. On: 02/21/2018 11:08   Ct Angio Chest Pe W Or Wo Contrast  Result Date: 03/01/2018 CLINICAL DATA:  Inpatient. Dyspnea for 1 month. Lower extremity edema. EXAM: CT ANGIOGRAPHY CHEST WITH CONTRAST TECHNIQUE: Multidetector CT imaging of the chest was performed using the standard protocol during bolus administration of intravenous contrast. Multiplanar CT image reconstructions and MIPs were obtained to evaluate the vascular anatomy. CONTRAST:  ISOVUE-370 IOPAMIDOL (ISOVUE-370) INJECTION 76% COMPARISON:  Chest radiograph from one day prior. 01/02/2018 chest CT. FINDINGS: Cardiovascular: The study is high quality for the evaluation of pulmonary embolism. There are no filling defects in the central, lobar, segmental or subsegmental pulmonary artery branches to suggest acute pulmonary embolism. Thoracic aorta is normal in course and caliber. Dilated main pulmonary artery (3.8 cm diameter), stable. Mild cardiomegaly with dilated right cardiac chambers. No significant pericardial fluid/thickening. Mediastinum/Nodes: No discrete thyroid nodules. Unremarkable esophagus. No pathologically enlarged axillary, mediastinal or hilar lymph nodes. Lungs/Pleura: No pneumothorax. Trace dependent right pleural effusion. No left pleural effusion. Moderate to severe centrilobular and paraseptal emphysema with mild diffuse bronchial  wall thickening. No acute consolidative airspace disease or significant pulmonary nodules. Bandlike subpleural focus of consolidation in the peripheral right upper lobe measures up to 8.3 x 2.4 cm (series 6/image 35), not appreciably changed since 01/02/2018 chest CT, associated with significant volume loss, distortion and bronchiectasis. Upper abdomen: Contrast reflux into the IVC and hepatic veins. Musculoskeletal: No aggressive appearing focal osseous lesions. Moderate thoracic spondylosis. Review of the MIP  images confirms the above findings. IMPRESSION: 1. No pulmonary embolism. 2. Mild cardiomegaly with enlarged right cardiac chambers. Contrast reflux into the IVC and hepatic veins. Findings suggest right heart failure. 3. Stable dilated main pulmonary artery, suggesting chronic pulmonary arterial hypertension. 4. Trace dependent right pleural effusion. 5. Stable bandlike subpleural consolidation, volume loss, distortion and bronchiectasis in the peripheral right upper lobe. Morphologically, this finding is most compatible with pleural-parenchymal scarring such as due to chronic granulomatous infection. Continued chest CT surveillance warranted in 6-12 months. 6. Moderate to severe emphysema with mild diffuse bronchial wall thickening, suggesting COPD. Emphysema (ICD10-J43.9). Electronically Signed   By: Delbert Phenix M.D.   On: 03/01/2018 13:02   Nm Pulmonary Perf And Vent  Result Date: 03/05/2018 CLINICAL DATA:  COPD, CHF, cor pulmonale, pulmonary hypertension, question chronic pulmonary embolism EXAM: NUCLEAR MEDICINE VENTILATION - PERFUSION LUNG SCAN TECHNIQUE: Ventilation images were obtained in multiple projections using inhaled aerosol Tc-63m DTPA. Perfusion images were obtained in multiple projections after intravenous injection of Tc-17m-MAA. RADIOPHARMACEUTICALS:  30 mCi of Tc-50m DTPA aerosol inhalation and 4.1 mCi Tc53m-MAA IV COMPARISON:  None Correlation: Chest radiograph 03/05/2018 FINDINGS: Ventilation: Impaired peripheral ventilation throughout the upper lobes with multiple small to moderate-sized subsegmental ventilatory defects especially RIGHT upper lobe. Less severely impaired peripheral ventilation in the lower lobes greater on LEFT. Perfusion: Matching subsegmental perfusion defects in the periphery of the RIGHT upper lobe. Better perfusion than ventilation in LEFT upper lobe. Fewer perfusion defects in the periphery of the lower lobes. Chest radiograph: Severe COPD changes. RIGHT upper lobe  opacity and volume loss with peripheral scarring versus mass. Enlargement of cardiac silhouette. IMPRESSION: Overall pattern of ventilation and perfusion is most consistent with COPD and RIGHT upper lobe scarring/volume loss. Observed RIGHT upper lobe changes have radiographically been present since 12/07/2016. Findings represent a low probability for pulmonary embolism. Electronically Signed   By: Ulyses Southward M.D.   On: 03/05/2018 16:43   US Venous Img Lower Bilateral  Result Date: 02/28/2018 CLINICAL DATA:  67 year old male with a history of shortness of breath and swelling EXAM: BILATERAL LOWER EXTREMITY VENOUS DOPPLER ULTRASOUND TECHNIQUE: Gray-scale sonography with graded compression, as well as color Doppler and duplex ultrasound were performed to evaluate the lower extremity deep venous systems from the level of the common femoral vein and including the common femoral, femoral, profunda femoral, popliteal and calf veins including the posterior tibial, peroneal and gastrocnemius veins when visible. The superficial great saphenous vein was also interrogated. Spectral Doppler was utilized to evaluate flow at rest and with distal augmentation maneuvers in the common femoral, femoral and popliteal veins. COMPARISON:  None. FINDINGS: RIGHT LOWER EXTREMITY Common Femoral Vein: No evidence of thrombus. Normal compressibility, respiratory phasicity and response to augmentation. Saphenofemoral Junction: No evidence of thrombus. Normal compressibility and flow on color Doppler imaging. Profunda Femoral Vein: No evidence of thrombus. Normal compressibility and flow on color Doppler imaging. Femoral Vein: No evidence of thrombus. Normal compressibility, respiratory phasicity and response to augmentation. Popliteal Vein: No evidence of thrombus. Normal compressibility, respiratory phasicity and response to augmentation. Calf  Veins: No evidence of thrombus. Normal compressibility and flow on color Doppler imaging.  Superficial Great Saphenous Vein: No evidence of thrombus. Normal compressibility and flow on color Doppler imaging. Other Findings:  Lower extremity edema LEFT LOWER EXTREMITY Common Femoral Vein: No evidence of thrombus. Normal compressibility, respiratory phasicity and response to augmentation. Saphenofemoral Junction: No evidence of thrombus. Normal compressibility and flow on color Doppler imaging. Profunda Femoral Vein: No evidence of thrombus. Normal compressibility and flow on color Doppler imaging. Femoral Vein: No evidence of thrombus. Normal compressibility, respiratory phasicity and response to augmentation. Popliteal Vein: No evidence of thrombus. Normal compressibility, respiratory phasicity and response to augmentation. Calf Veins: No evidence of thrombus. Normal compressibility and flow on color Doppler imaging. Superficial Great Saphenous Vein: No evidence of thrombus. Normal compressibility and flow on color Doppler imaging. Other Findings:  Lower extremity edema IMPRESSION: Sonographic survey of the bilateral lower extremities negative for DVT Bilateral lower extremity edema Electronically Signed   By: Gilmer Mor D.O.   On: 02/28/2018 16:16       LAB RESULTS: Basic Metabolic Panel: Recent Labs  Lab 03/02/18 0726  03/05/18 0255 03/06/18 0401  NA 137   < > 136 135  K 3.5   < > 3.8 4.4  CL 96*   < > 99* 97*  CO2 30   < > 30 27  GLUCOSE 103*   < > 101* 100*  BUN 30*   < > 21* 21*  CREATININE 1.43*   < > 1.26* 1.28*  CALCIUM 9.1   < > 8.5* 8.8*  MG 1.8  --   --   --    < > = values in this interval not displayed.   Liver Function Tests: No results for input(s): AST, ALT, ALKPHOS, BILITOT, PROT, ALBUMIN in the last 168 hours. No results for input(s): LIPASE, AMYLASE in the last 168 hours. No results for input(s): AMMONIA in the last 168 hours. CBC: Recent Labs  Lab 02/28/18 1840  03/05/18 0255 03/06/18 0401  WBC 8.1   < > 8.3 8.5  NEUTROABS 4.6  --   --   --   HGB  13.7   < > 14.7 15.2  HCT 43.4   < > 44.4 46.5  MCV 87.7   < > 86.4 87.2  PLT 247   < > 229 220   < > = values in this interval not displayed.   Cardiac Enzymes: Recent Labs  Lab 03/01/18 0338 03/01/18 0927  TROPONINI <0.03 <0.03   BNP: Invalid input(s): POCBNP CBG: No results for input(s): GLUCAP in the last 168 hours.    Disposition and Follow-up: Discharge Instructions    (HEART FAILURE PATIENTS) Call MD:  Anytime you have any of the following symptoms: 1) 3 pound weight gain in 24 hours or 5 pounds in 1 week 2) shortness of breath, with or without a dry hacking cough 3) swelling in the hands, feet or stomach 4) if you have to sleep on extra pillows at night in order to breathe.   Complete by:  As directed    Diet - low sodium heart healthy   Complete by:  As directed    Increase activity slowly   Complete by:  As directed        DISPOSITION: Home   DISCHARGE FOLLOW-UP Follow-up Information    Dettinger, Elige Radon, MD. Schedule an appointment as soon as possible for a visit in 2 week(s).   Specialties:  Family Medicine, Cardiology Contact  information: [address] [place] [address] [phone]        [facility]. Go on 03/15/2018.   Specialty:  Cardiology Why:  12:00 PM, [facility], parking code [employer] information: [address] [id number] [place] [address] [phone]           Time coordinating discharge:  35 minutes  Signed:   Thad Ranger M.D. [facility] 03/06/2018, 11:29 AM Pager: [phone]

## 2018-03-06 NOTE — Progress Notes (Signed)
CARDIAC REHAB PHASE I   Pt walked with mobility tech earlier. I discussed HF management and HF booklet with pt. He voiced understanding but does not seem very receptive. Sts he will not be wearing O2. I discussed the implications on his heart. His HR is 115 ST and SBP in the 80s (checked multiple times). [id number] Harriet Masson CES, ACSM 03/06/2018 12:30 PM

## 2018-03-06 NOTE — Plan of Care (Signed)
  Progressing Clinical Measurements: Will remain free from infection 03/06/2018 0321 - Progressing by Horris Latino, RN Note No s/s of infection noted. Coping: Level of anxiety will decrease 03/06/2018 0321 - Progressing by Horris Latino, RN Note No s/s of anxiety noted.

## 2018-03-06 NOTE — Progress Notes (Addendum)
Discharge instruction was given to pt and family. Pt was unable to take bisoprolol due to low SBP 88. Pt denies dizziness or light headedness.  Pt is awaiting for home O2 delivery.  Hinton Dyer, RN

## 2018-03-06 NOTE — Progress Notes (Addendum)
Name: Zachary [name] MRN: [id number] DOB: Jan 03, 1951    ADMISSION DATE:  02/28/2018 CONSULTATION DATE:  03/05/2018  REFERRING MD :  Dr. Isidoro Donning  CHIEF COMPLAINT:  Dyspnea  HISTORY OF PRESENT ILLNESS:   67 year old male with past medical history of tobacco abuse- 1 ppd x 50 yrs, COPD- not on home O2, chronic RUL infiltrate since prior pneumonia in 2017, hypertension, and GERD admitted to [facility] on 3/13 with acute hypoxic respiratory failure of unclear etiology.    Noted previous asbestos exposure with previous job at [employer] in which is states is followed at [place], through pulmonologist, Dr. Adah Perl.  Patient last seen in our office by Dr. Sherene Sires in 01/03/2017.  Is followed by Dr. Dorris Fetch every 6 months for chronic RUL mass-like opacity dating back to 2017.  PET scan showing hypermetabolic activity.  Twice biopsied, by bronch and CT-guided, both showing atypical cells, no definite tumor.  Followed by chest CT every 6 months. PFTs showing with obstructive pattern with DLCO of 21% predicted.  Denies prior any prior blood clots, PE, joint pain or swelling, rashes, or family history of autoimmune disease.  Patient states he quit smoking 1-2 months ago but occasionally still "cheats".  Patient presented after one month of progressive dyspnea, lower extremity edema, and orthopnea.  Found to be hypoxic by PCP on 3/6 treated with azithromycin, prednisone, albuterol and Symbicort without improvement with worsening hypoxia.  No fevers.  Bilateral lower extremity ultrasounds negative for DVT.  CTA negative for pulmonary embolism but with significant emphysema and RUL bronchiectasis.  TTE on 3/13 showed reduced systolic EF 40-45% with severe dilation of RV with moderate systolic dysfunction, moderate to sever TR,  with PAP of 84 mm Hg, and small pericardial effusion.  He was diuresed with lasix since admission is presently down 12lbs.  Cardiology consulted.  He was taken for a RHC today.  PCCM  consulted for further pulmonary recommendations.     SUBJECTIVE:  67 year old who by criteria of ambulation in halls without oxygen desaturations to 82%  qualifies is O2 dependent.  2 L nasal cannula at rest and 4 L with activity.  He flatly told me he would not wear oxygen outside of his house.  VITAL SIGNS: Temp:  [97.9 F (36.6 C)-98.7 F (37.1 C)] 98.7 F (37.1 C) (03/19 0602) Pulse Rate:  [109-125] 117 (03/19 1015) Resp:  [10-23] 17 (03/19 0602) BP: (83-122)/(63-88) 83/63 (03/19 1015) SpO2:  [87 %-95 %] 92 % (03/19 1015) Weight:  [60 kg (132 lb 3.2 oz)] 60 kg (132 lb 3.2 oz) (03/19 0602)  PHYSICAL EXAMINATION: General: Thin male no acute distress HEENT: JVD lymphadenopathy PSY: Appropriate affect CV: Sounds are regular regular rate and rhythm PULM: Short of breath post walking in the hallways.  Heart rate 7130s WU:JWJX, non-tender, bsx4 active  Extremities: warm/dry, negative edema Skin: no rashes or lesions   Recent Labs  Lab 03/04/18 0336 03/05/18 0255 03/06/18 0401  NA 136 136 135  K 3.3* 3.8 4.4  CL 97* 99* 97*  CO2 30 30 27   BUN 29* 21* 21*  CREATININE 1.24 1.26* 1.28*  GLUCOSE 88 101* 100*   Recent Labs  Lab 03/04/18 0336 03/05/18 0255 03/06/18 0401  HGB 14.4 14.7 15.2  HCT 43.2 44.4 46.5  WBC 8.8 8.3 8.5  PLT 229 229 220   Dg Chest 2 View  Result Date: 03/05/2018 CLINICAL DATA:  Shortness of breath and cough, pulmonary hypertension, COPD, CHF, cor pulmonale EXAM: CHEST -  2 VIEW COMPARISON:  02/28/2018, 02/06/2017, 12/07/2016 FINDINGS: Enlargement of cardiac silhouette. Mediastinal contours and pulmonary vascularity normal. Again identified emphysematous and minimal central bronchitic changes consistent with COPD. Interstitial fibrosis at lung bases, chronic and unchanged. Pleuroparenchymal opacity with volume loss in the RIGHT upper lobe laterally unchanged since previous exam. No acute infiltrate, pleural effusion or pneumothorax. Bones unremarkable.  IMPRESSION: Changes of COPD, RIGHT upper lobe scarring/volume loss, and bibasilar interstitial fibrosis which appear grossly stable since prior exam. No new abnormalities. Electronically Signed   By: Ulyses Southward M.D.   On: 03/05/2018 16:45   Nm Pulmonary Perf And Vent  Result Date: 03/05/2018 CLINICAL DATA:  COPD, CHF, cor pulmonale, pulmonary hypertension, question chronic pulmonary embolism EXAM: NUCLEAR MEDICINE VENTILATION - PERFUSION LUNG SCAN TECHNIQUE: Ventilation images were obtained in multiple projections using inhaled aerosol Tc-9m DTPA. Perfusion images were obtained in multiple projections after intravenous injection of Tc-31m-MAA. RADIOPHARMACEUTICALS:  30 mCi of Tc-15m DTPA aerosol inhalation and 4.1 mCi Tc31m-MAA IV COMPARISON:  None Correlation: Chest radiograph 03/05/2018 FINDINGS: Ventilation: Impaired peripheral ventilation throughout the upper lobes with multiple small to moderate-sized subsegmental ventilatory defects especially RIGHT upper lobe. Less severely impaired peripheral ventilation in the lower lobes greater on LEFT. Perfusion: Matching subsegmental perfusion defects in the periphery of the RIGHT upper lobe. Better perfusion than ventilation in LEFT upper lobe. Fewer perfusion defects in the periphery of the lower lobes. Chest radiograph: Severe COPD changes. RIGHT upper lobe opacity and volume loss with peripheral scarring versus mass. Enlargement of cardiac silhouette. IMPRESSION: Overall pattern of ventilation and perfusion is most consistent with COPD and RIGHT upper lobe scarring/volume loss. Observed RIGHT upper lobe changes have radiographically been present since 12/07/2016. Findings represent a low probability for pulmonary embolism. Electronically Signed   By: Ulyses Southward M.D.   On: 03/05/2018 16:43    SIGNIFICANT EVENTS  3/13 Admit 3/18 RHC as noted  STUDIES:  3/13 BLE venous duplex >> neg 3/13 TTE >>  LVEF 40-45%, difficult to assess wall motion, possible  hypokinesis of inferior and apical myocardium, RV severely dilated with reduced systolic function, RA severely dilated, mod-sev TR, PAP 84 mm Hg, small pericardial effusion   3/14 CTA chest >> 1. No pulmonary embolism. 2. Mild cardiomegaly with enlarged right cardiac chambers. Contrast reflux into the IVC and hepatic veins. Findings suggest right heart failure. 3. Stable dilated main pulmonary artery, suggesting chronic pulmonary arterial hypertension. 4. Trace dependent right pleural effusion. 5. Stable bandlike subpleural consolidation, volume loss, distortion and bronchiectasis in the peripheral right upper lobe. Morphologically, this finding is most compatible with pleural-parenchymal scarring such as due to chronic granulomatous infection. Continued chest CT surveillance warranted in 6-12 months. 6. Moderate to severe emphysema with mild diffuse bronchial wall thickening, suggesting COPD. 7.  Emphysema   3/18 RHC >>  3/18 NM perf studies >>  Intake/Output Summary (Last 24 hours) at 03/06/2018 1100 Last data filed at 03/06/2018 1041 Gross per 24 hour  Intake 1595.83 ml  Output 1650 ml  Net -54.17 ml    ASSESSMENT / PLAN:  Acute on chronic hypoxic respiratory failure Acute on chronic systolic HF  Pulmonary hypertension - PAP > 80 on TTE, likely due to combined group 2/3, r/o group 1 and 4 Severe COPD/ bullous emphysema without acute exacerbation  Former Tobacco abuse- still smokes occasionally Chronic RUL opacity - CTA neg for PE - Improvement with diuresis P:  Ambulatory walk test 03/06/2018 reveals desaturation 82% on room air 88% on 2 L and 90%  with 4 L.  Therefore currently he is O2 dependent requiring 2 L at rest 4 L with activity.  He stated he would not wear oxygen outside of his home.  Continue current treatment for chronic obstructive pulmonary disease and bullous emphysema    Pulmonary artery arterial hypertension is moderate   Post cardiac cath with noted  to have ejection fraction of 45% along with severe dilatation of right atrium and moderate to severe TR.  Been followed by cardiology.  Being diuresed by cardiology  Right upper lobe mass that is followed by Dr. Dorris Fetch via CT and he is not a surgical candidate.   P CCM will be available as needed  Brett Canales Minor ACNP [facility] Pollack PCCM Pager [phone] till 1 pm If no answer page [phone] 03/06/2018, 10:57 AM  Attending Note:  67 year old male with PMH of asbestos exposure, COPD and pulmonary fibrosis that is presenting with acute on chronic hypoxemic respiratory failure.  On exam, distant BS present.  I reviewed chest CT myself, RUL fibrotic changes noted with diffuse severe emphysema and chronic changes consistent with asbestosis.  Discussed with PCCM-NP.  Acute on chronic hypoxemic respiratory failure that is multifactorial in nature.                - Titrate O2 for sat of 90% or higher  COPD:             - Not an acute exacerbation, no need for steroids             - Continue bronchodilators  Pulmonary HTN:             - VQ scan with low probability PE that I reviewed myself but chronic changes noted             - Auto-imune panel pending, can be followed up as outpatient             - Advanced heart failure team following  Tobacco abuse:             - Smoking cessation  Asbestosis and concern for a lung mass:             - PET scan upon discharge (can not be done as inpatient) with f/u with PCCM as outpatient.  PCCM will sign off, please call back if needed.  Patient seen and examined, agree with above note.  I dictated the care and orders written for this patient under my direction.  Alyson Reedy, MD [phone]

## 2018-03-07 ENCOUNTER — Other Ambulatory Visit: Payer: Self-pay

## 2018-03-07 LAB — RHEUMATOID FACTOR: RHEUMATOID FACTOR: 17.6 [IU]/mL — AB (ref 0.0–13.9)

## 2018-03-07 LAB — ANGIOTENSIN CONVERTING ENZYME: ANGIOTENSIN-CONVERTING ENZYME: 25 U/L (ref 14–82)

## 2018-03-07 LAB — ANA W/REFLEX IF POSITIVE: ANA: NEGATIVE

## 2018-03-07 NOTE — Patient Outreach (Signed)
[facility]) Care Management  03/07/2018  Zachary [name] 12-26-1950 [id number]   Telephone Screen  Referral Date: 03/07/18 Referral Source: [facility] Primary Care Navigator Referral Reason: " "further assessment of needs, provide information/health education and reinforce disease mgmt of HF and other chronic illness(COPD)" Insurance: [facility] Medicare   Outreach attempt # 1 to patient. No answer at present. RN CM left HIPAA compliant voicemail message along with contact info.      Plan: RN CM will make outreach attempt to patient within three business days if no return call. RN CM will send unsuccessful outreach letter to patient.   Antionette Fairy, RN,BSN,CCM [facility] Care Management Telephonic Care Management Coordinator Direct Phone: [phone] Toll Free: [phone] Fax: [phone]

## 2018-03-08 ENCOUNTER — Telehealth: Payer: Self-pay | Admitting: Family Medicine

## 2018-03-08 DIAGNOSIS — Z0289 Encounter for other administrative examinations: Secondary | ICD-10-CM

## 2018-03-08 LAB — ANCA TITERS: Atypical P-ANCA titer: <1:20 {titer}

## 2018-03-08 NOTE — Telephone Encounter (Signed)
Pt states he is on Bisoprolol 2.5mg  but his BP is 84/62 so I advised to not take the Bisoprolol with that low of a BP. Pt wants to know if he should just hold on the medication and stop taking it?

## 2018-03-08 NOTE — Telephone Encounter (Signed)
Patient's wife aware, appointment made for hospital follow up tomorrow

## 2018-03-08 NOTE — Telephone Encounter (Signed)
He can hold on the medication for now and keep checking his blood pressures but he needs to get in and see [place] soon so we can evaluate it and discuss options.

## 2018-03-09 ENCOUNTER — Encounter: Payer: Self-pay | Admitting: Family Medicine

## 2018-03-09 ENCOUNTER — Other Ambulatory Visit: Payer: Self-pay

## 2018-03-09 ENCOUNTER — Ambulatory Visit (INDEPENDENT_AMBULATORY_CARE_PROVIDER_SITE_OTHER): Payer: Medicare Other | Admitting: Family Medicine

## 2018-03-09 VITALS — BP 104/70 | HR 102 | Temp 98.0°F | Ht 69.0 in | Wt 144.5 lb

## 2018-03-09 DIAGNOSIS — I2781 Cor pulmonale (chronic): Secondary | ICD-10-CM

## 2018-03-09 DIAGNOSIS — J84112 Idiopathic pulmonary fibrosis: Secondary | ICD-10-CM

## 2018-03-09 DIAGNOSIS — I272 Pulmonary hypertension, unspecified: Secondary | ICD-10-CM

## 2018-03-09 DIAGNOSIS — I5033 Acute on chronic diastolic (congestive) heart failure: Secondary | ICD-10-CM

## 2018-03-09 MED ORDER — LOSARTAN POTASSIUM 25 MG PO TABS: 12.5000 mg | ORAL_TABLET | Freq: Every day | ORAL | 3 refills | Status: DC

## 2018-03-09 NOTE — Patient Instructions (Addendum)
Cut losartan in half, take 12.5 mg.  Monitor blood pressure closely and see if we can feasibly take bisoprolol if blood pressure increases  Keep oxygen at 3 L when active and 2 L at rest

## 2018-03-09 NOTE — Patient Outreach (Signed)
[facility]) Care Management  03/09/2018  Zachary [name] [birth year] [id number]   Telephone Screen  Referral Date: 03/07/18 Referral Source: [facility] Primary Care Navigator Referral Reason: " "further assessment of needs, provide information/health education and reinforce disease mgmt of HF and other chronic illness(COPD)" Insurance: [facility] Medicare    Outreach attempt #2 to patient. No answer at present.     Plan: RN CM will make outreach attempt to patient within three business days.     Antionette Fairy, RN,BSN,CCM [facility] Care Management Telephonic Care Management Coordinator Direct Phone: [phone] Toll Free: [phone] Fax: [phone]

## 2018-03-09 NOTE — Progress Notes (Signed)
BP 104/70   Pulse (!) 102   Temp 98 F (36.7 C) (Oral)   Ht 5\' 9"  (1.753 m)   Wt 144 lb 8 oz (65.5 kg)   SpO2 (!) 85%   BMI 21.34 kg/m    Subjective:    Patient ID: Zachary [name], male    DOB: July 16, 1951, 67 y.o.   MRN: [id number]  HPI: Zachary [name] is a 67 y.o. male presenting on 03/09/2018 for Hospitalization Follow-up (Hypotension, oxygen level running in mid 80's when on oxygen at home)   HPI Hospital follow-up Patient is coming in today for hospital follow-up.  He was admitted to the hospital on 02/28/2018 and discharged on 03/06/2018, he was initially admitted to [facility] but then was sent to [facility].  He was diagnosed with cor pulmonale and pulmonary hypertension and possible pulmonary fibrosis and COPD exacerbation and acute respiratory failure secondary to these conditions.  He had an echocardiogram that showed an EF of 40-45% with severe tricuspid regurg.  During the hospitalization they got him on oxygen and cleared out some of his excess fluid using diuretics and started him on some cardiac medications and got him set up with both cardiology and pulmonology for outpatient.  He says that he is still feeling short of breath but he does have home oxygen now at 2 L nasal cannula which does help him feel better but does not increase his pulse oxygen and he still stays in the low 80s.  He does feel better when he is on oxygen.  He says the swelling in his legs is starting to come back to slow but he is up 4 pounds from when he left the hospital.  He has been taking the Lasix daily and told him to increase to twice daily if it is over a certain weight.  He denies any significant cough or congestion or wheezing.  He denies any fevers or chills.  He denies any chest pain or palpitations.  He is still feeling very weak since leaving the hospital but is starting to feel better.  He also had mildly elevated creatinine in the hospital.  Relevant past medical, surgical,  family and social history reviewed and updated as indicated. Interim medical history since our last visit reviewed. Allergies and medications reviewed and updated.  Review of Systems  Constitutional: Negative for chills and fever.  HENT: Negative for congestion and sore throat.   Eyes: Negative for discharge.  Respiratory: Positive for shortness of breath. Negative for cough and wheezing.   Cardiovascular: Positive for leg swelling. Negative for chest pain and palpitations.  Musculoskeletal: Negative for back pain and gait problem.  Skin: Negative for rash.  All other systems reviewed and are negative.   Per HPI unless specifically indicated above   Allergies as of 03/09/2018      Reactions   Aspirin Other (See Comments)   Stomach burning       Medication List        Accurate as of 03/09/18  4:49 PM. Always use your most recent med list.          albuterol 108 (90 Base) MCG/ACT inhaler Commonly known as:  PROVENTIL HFA;VENTOLIN HFA Inhale 2 puffs into the lungs every 6 (six) hours as needed for wheezing or shortness of breath.   aspirin 81 MG chewable tablet Chew 1 tablet (81 mg total) by mouth daily.   atorvastatin 10 MG tablet Commonly known as:  LIPITOR Take 1 tablet (10  mg total) by mouth at bedtime.   bisoprolol 5 MG tablet Commonly known as:  ZEBETA Take 0.5 tablets (2.5 mg total) by mouth daily.   budesonide-formoterol 160-4.5 MCG/ACT inhaler Commonly known as:  SYMBICORT Inhale 2 puffs into the lungs 2 (two) times daily.   furosemide 20 MG tablet Commonly known as:  LASIX Take 1 tablet (20 mg total) by mouth daily.   hydroxypropyl methylcellulose / hypromellose 2.5 % ophthalmic solution Commonly known as:  ISOPTO TEARS / GONIOVISC Place 1 drop into both eyes as needed for dry eyes.   losartan 25 MG tablet Commonly known as:  COZAAR Take 0.5 tablets (12.5 mg total) by mouth daily.   omeprazole 40 MG capsule Commonly known as:  PRILOSEC TAKE 1  CAPSULE (40 MG TOTAL) BY MOUTH DAILY.            Durable Medical Equipment  (From admission, onward)        Start     Ordered   03/09/18 0000  For home use only DME oxygen    Comments:  Patient needs a portable because he is active.  Question Answer Comment  Mode or (Route) Nasal cannula   Liters per Minute 2   Frequency Continuous (stationary and portable oxygen unit needed)   Oxygen conserving device Yes   Oxygen delivery system Gas      03/09/18 1649         Objective:    BP 104/70   Pulse (!) 102   Temp 98 F (36.7 C) (Oral)   Ht 5\' 9"  (1.753 m)   Wt 144 lb 8 oz (65.5 kg)   SpO2 (!) 85%   BMI 21.34 kg/m   Wt Readings from Last 3 Encounters:  03/09/18 144 lb 8 oz (65.5 kg)  03/06/18 132 lb 3.2 oz (60 kg)  02/28/18 154 lb 9.6 oz (70.1 kg)    Physical Exam  Constitutional: He is oriented to person, place, and time. He appears well-developed and well-nourished. No distress.  Eyes: Conjunctivae are normal. No scleral icterus.  Neck: Neck supple. No thyromegaly present.  Cardiovascular: Normal rate, regular rhythm, normal heart sounds and intact distal pulses.  No murmur heard. Pulmonary/Chest: Effort normal and breath sounds normal. No respiratory distress. He has no wheezes. He has no rales.  Musculoskeletal: Normal range of motion. He exhibits no edema.  Lymphadenopathy:    He has no cervical adenopathy.  Neurological: He is alert and oriented to person, place, and time. Coordination normal.  Skin: Skin is warm and dry. No rash noted. He is not diaphoretic.  Psychiatric: He has a normal mood and affect. His behavior is normal.  Nursing note and vitals reviewed.  85% on room air while sitting, goes up to 96% on 2 L nasal cannula     Assessment & Plan:   Problem List Items Addressed This Visit      Cardiovascular and Mediastinum   CHF (congestive heart failure) (HCC)   Relevant Medications   losartan (COZAAR) 25 MG tablet   Other Relevant Orders    Ambulatory referral to Pulmonology   For home use only DME oxygen   Cor pulmonale (chronic) (HCC)   Relevant Medications   losartan (COZAAR) 25 MG tablet   Other Relevant Orders   Ambulatory referral to Pulmonology   For home use only DME oxygen   Pulmonary hypertension, unspecified (HCC) - Primary   Relevant Medications   losartan (COZAAR) 25 MG tablet   Other Relevant Orders  Ambulatory referral to Pulmonology   For home use only DME oxygen     Respiratory   IPF (idiopathic pulmonary fibrosis) (HCC)   Relevant Medications   losartan (COZAAR) 25 MG tablet   Other Relevant Orders   Ambulatory referral to Pulmonology   For home use only DME oxygen       Follow up plan: Return in about 2 weeks (around 03/23/2018), or if symptoms worsen or fail to improve, for Follow-up CHF and make sure referrals got taken.  Counseling provided for all of the vaccine components Orders Placed This Encounter  Procedures  . For home use only DME oxygen  . Ambulatory referral to Pulmonology    Arville Care, MD [facility] 03/09/2018, 4:49 PM

## 2018-03-12 ENCOUNTER — Ambulatory Visit: Payer: Medicare Other | Admitting: Family Medicine

## 2018-03-14 ENCOUNTER — Other Ambulatory Visit: Payer: Self-pay

## 2018-03-14 NOTE — Patient Outreach (Signed)
[facility]) Care Management  03/14/2018  Zachary [name] [birth year] [id number]   Telephone Screen  Referral Date:03/07/18 Referral Source:[facility] Primary Care Navigator Referral Reason:" "further assessment of needs, provide information/health education and reinforce disease mgmt of HF and other chronic illness(COPD)" Insurance:[employer] Medicare    Outreach attempt #3 to patient. No answer at present. RN CM left HIPAA compliant voicemail message along with contact info.      Plan: RN CM has already sent unsuccessful outreach letter to patient and will close case if no response from patient.     Antionette Fairy, RN,BSN,CCM [facility] Care Management Telephonic Care Management Coordinator Direct Phone: [phone] Toll Free: [phone] Fax: [phone]

## 2018-03-15 ENCOUNTER — Ambulatory Visit (HOSPITAL_COMMUNITY)
Admission: RE | Admit: 2018-03-15 | Discharge: 2018-03-15 | Disposition: A | Discharge status: Home / Self Care | Payer: Medicare Other | Source: Ambulatory Visit | Attending: Cardiology | Admitting: Cardiology

## 2018-03-15 ENCOUNTER — Other Ambulatory Visit: Payer: Self-pay | Admitting: Family Medicine

## 2018-03-15 ENCOUNTER — Encounter (HOSPITAL_COMMUNITY): Payer: Self-pay

## 2018-03-15 VITALS — BP 102/70 | HR 108 | Wt 146.0 lb

## 2018-03-15 DIAGNOSIS — Z7982 Long term (current) use of aspirin: Secondary | ICD-10-CM | POA: Diagnosis not present

## 2018-03-15 DIAGNOSIS — N183 Chronic kidney disease, stage 3 unspecified: Secondary | ICD-10-CM

## 2018-03-15 DIAGNOSIS — I130 Hypertensive heart and chronic kidney disease with heart failure and stage 1 through stage 4 chronic kidney disease, or unspecified chronic kidney disease: Secondary | ICD-10-CM | POA: Diagnosis not present

## 2018-03-15 DIAGNOSIS — I5032 Chronic diastolic (congestive) heart failure: Secondary | ICD-10-CM | POA: Diagnosis not present

## 2018-03-15 DIAGNOSIS — I272 Pulmonary hypertension, unspecified: Secondary | ICD-10-CM | POA: Diagnosis not present

## 2018-03-15 DIAGNOSIS — I5022 Chronic systolic (congestive) heart failure: Secondary | ICD-10-CM | POA: Diagnosis not present

## 2018-03-15 DIAGNOSIS — J439 Emphysema, unspecified: Secondary | ICD-10-CM | POA: Diagnosis not present

## 2018-03-15 DIAGNOSIS — J449 Chronic obstructive pulmonary disease, unspecified: Secondary | ICD-10-CM | POA: Insufficient documentation

## 2018-03-15 DIAGNOSIS — I10 Essential (primary) hypertension: Secondary | ICD-10-CM | POA: Diagnosis not present

## 2018-03-15 DIAGNOSIS — I2510 Atherosclerotic heart disease of native coronary artery without angina pectoris: Secondary | ICD-10-CM | POA: Insufficient documentation

## 2018-03-15 DIAGNOSIS — Z87891 Personal history of nicotine dependence: Secondary | ICD-10-CM | POA: Insufficient documentation

## 2018-03-15 DIAGNOSIS — J441 Chronic obstructive pulmonary disease with (acute) exacerbation: Secondary | ICD-10-CM

## 2018-03-15 DIAGNOSIS — Z79899 Other long term (current) drug therapy: Secondary | ICD-10-CM | POA: Insufficient documentation

## 2018-03-15 LAB — BASIC METABOLIC PANEL
ANION GAP: 8 (ref 5–15)
BUN: 26 mg/dL — ABNORMAL HIGH (ref 6–20)
CALCIUM: 9.1 mg/dL (ref 8.9–10.3)
CO2: 24 mmol/L (ref 22–32)
Chloride: 108 mmol/L (ref 101–111)
Creatinine, Ser: 1.46 mg/dL — ABNORMAL HIGH (ref 0.61–1.24)
GFR calc Af Amer: 56 mL/min — ABNORMAL LOW (ref >60)
GFR calc non Af Amer: 48 mL/min — ABNORMAL LOW (ref >60)
GLUCOSE: 96 mg/dL (ref 65–99)
Potassium: 4.2 mmol/L (ref 3.5–5.1)
Sodium: 140 mmol/L (ref 135–145)

## 2018-03-15 NOTE — Progress Notes (Signed)
PCP: Dr Dettinger  Primary Cardiologist: Dr Shirlee Latch   HPI: Zachary Scales [place] a 67 y.o.malewith a history of COPD, HTN, and chronic RUL infiltrate.  Admitted 3/13 through 03/06/2018. Admitted with increased dyspnea. Diuresed with IV lasix and transitioned to 20 mg lasix daily. Had RHC with low filling pressures and moderate PAH related to severe COPD. He was discharged with home oxygen and plans for outpatient sleep study. Discharge weight 132 pounds.   Today he returns for HF follow up. He has not been taking bisoprolol due to hypotension but taking other medications. Overall feeling fine. He has ongoing SOB with exertion. Says he does not use oxygen consistently. He has been using 2-4 liters oxygen. Says he does not want to use oxygen when goes to church and would rather die than use it. Denies PND/Orthopnea. Has intermittent dizziness when standing. Denies syncopal episodes. Weight at home has been trending up from 132 to 141 pounds. No fever or chills.  R/LHC 03/05/2018: 1. Low right and left heart filling pressures.  2. Moderate pulmonary arterial hypertension, likely group 3 PH related to severe COPD.  3. Nonobstructive coronary disease RA mean 3 RV 49/4 PA 52/10, mean 32 PCWP mean 5 LV 100/1 AO 101/69 Oxygen saturations: PA 66% AO 95% Cardiac Output (Fick) 4.04  Cardiac Index (Fick) 2.3 PVR 6.7 WU  Echo 02/28/2018: - Left ventricle: The cavity size was normal. There was mild focal basal hypertrophy of the septum. Systolic function was mildly to moderately reduced. The estimated ejection fraction was in the range of 40% to 45%. LVEF and wall motion difficult to assess in setting of tachycardia and abnormal septal motion. Possible hypokinesis of the inferior and apical myocardium. The study is not technically sufficient to allow evaluation of LV diastolic function. - Ventricular septum: The contour showed diastolic flattening and systolic flattening  consistent with right ventricular volume and pressure overload. - Aortic valve: Mildly calcified annulus. Trileaflet. - Mitral valve: There was trivial regurgitation. - Right ventricle: The cavity size was severely dilated. Systolic function was moderately reduced. Possible McConnell&'s sign, pulmonary embolus should be considered. - Right atrium: The atrium was severely dilated. Cannot exclude thrombi within right atrium, echodensities seen intermittently, although with image dropout due to respiratory motion. Central venous pressure (est): 15 mm Hg. - Tricuspid valve: There was moderate-severe regurgitation. - Pulmonary arteries: Systolic pressure was severely increased. PA peak pressure: 84 mm Hg (S). - Pericardium, extracardiac: A small pericardial effusion was identified posterior to the heart.  CTA Chest 03/01/2018: 1. No pulmonary embolism. 2. Mild cardiomegaly with enlarged right cardiac chambers. Contrast reflux into the IVC and hepatic veins. Findings suggest right heart failure. 3. Stable dilated main pulmonary artery, suggesting chronic pulmonary arterial hypertension. 4. Trace dependent right pleural effusion. 5. Stable bandlike subpleural consolidation, volume loss, distortion and bronchiectasis in the peripheral right upper lobe. Morphologically, this finding is most compatible with pleural-parenchymal scarring such as due to chronic granulomatous infection. Continued chest CT surveillance warranted in 6-12 months. 6. Moderate to severe emphysema with mild diffuse bronchial wall thickening, suggesting COPD.    ROS: All systems negative except as listed in HPI, PMH and Problem List.  SH:  Social History   Socioeconomic History  . Marital status: Married    Spouse name: Not on file  . Number of children: Not on file  . Years of education: Not on file  . Highest education level: Not on file  Occupational History  . Not on file  Social Needs    .  Financial resource strain: Not on file  . Food insecurity:    Worry: Not on file    Inability: Not on file  . Transportation needs:    Medical: Not on file    Non-medical: Not on file  Tobacco Use  . Smoking status: Former Smoker    Packs/day: 0.10    Types: Cigarettes    Start date: 01/27/1974    Last attempt to quit: 05/19/2017    Years since quitting: 0.8  . Smokeless tobacco: Never Used  Substance and Sexual Activity  . Alcohol use: No  . Drug use: No  . Sexual activity: Not on file  Lifestyle  . Physical activity:    Days per week: Not on file    Minutes per session: Not on file  . Stress: Not on file  Relationships  . Social connections:    Talks on phone: Not on file    Gets together: Not on file    Attends religious service: Not on file    Active member of club or organization: Not on file    Attends meetings of clubs or organizations: Not on file    Relationship status: Not on file  . Intimate partner violence:    Fear of current or ex partner: Not on file    Emotionally abused: Not on file    Physically abused: Not on file    Forced sexual activity: Not on file  Other Topics Concern  . Not on file  Social History Narrative  . Not on file    FH:  Family History  Problem Relation Age of Onset  . Heart disease Father     Past Medical History:  Diagnosis Date  . COPD, severe (HCC)   . Essential hypertension   . GERD (gastroesophageal reflux disease)   . History of asbestos exposure   . Mass of upper lobe of right lung    Biopsy shows atypical cells but no definite tumor - followed by Dr. Dorris Fetch    Current Outpatient Medications  Medication Sig Dispense Refill  . albuterol (PROVENTIL HFA;VENTOLIN HFA) 108 (90 Base) MCG/ACT inhaler Inhale 2 puffs into the lungs every 6 (six) hours as needed for wheezing or shortness of breath. 1 Inhaler 0  . aspirin 81 MG chewable tablet Chew 1 tablet (81 mg total) by mouth daily. 30 tablet 3  . atorvastatin  (LIPITOR) 10 MG tablet Take 1 tablet (10 mg total) by mouth at bedtime. 30 tablet 3  . bisoprolol (ZEBETA) 5 MG tablet Take 0.5 tablets (2.5 mg total) by mouth daily. 30 tablet 3  . budesonide-formoterol (SYMBICORT) 160-4.5 MCG/ACT inhaler Inhale 2 puffs into the lungs 2 (two) times daily. 1 Inhaler 3  . furosemide (LASIX) 20 MG tablet Take 1 tablet (20 mg total) by mouth daily. 30 tablet 3  . hydroxypropyl methylcellulose / hypromellose (ISOPTO TEARS / GONIOVISC) 2.5 % ophthalmic solution Place 1 drop into both eyes as needed for dry eyes.    Marland Kitchen losartan (COZAAR) 25 MG tablet Take 0.5 tablets (12.5 mg total) by mouth daily. 45 tablet 3  . omeprazole (PRILOSEC) 40 MG capsule TAKE 1 CAPSULE (40 MG TOTAL) BY MOUTH DAILY. 90 capsule 1   No current facility-administered medications for this encounter.     Vitals:   03/15/18 1209  BP: 102/70  Pulse: (!) 108  SpO2: (!) 82%  Weight: 146 lb (66.2 kg)    PHYSICAL EXAM: General:   No resp difficulty> Walked slowly in the clinic.  HEENT: normal Neck: supple. JVP 9-10 . Carotids 2+ bilaterally; no bruits. No lymphadenopathy or thryomegaly appreciated. Cor: PMI normal. Regular rate & rhythm. No rubs, gallops or murmurs. Lungs: clear on 2 liters  Abdomen: soft, nontender, nondistended. No hepatosplenomegaly. No bruits or masses. Good bowel sounds. Extremities: no cyanosis, clubbing, rash, LLE 1+ edema Neuro: alert & orientedx3, cranial nerves grossly intact. Moves all 4 extremities w/o difficulty. Affect pleasant.     ASSESSMENT & PLAN:  1. Chronic Systolic Heart Failure Echo 02/28/2018: EF 40-45%, ?hypokinesis of inferior and apical myocardium, severe dilation of RV with moderate systolic dysfunction, severe dilation of right atrium, mod-severe TR, PA peak pressure 84, small pericardial effusion.Low filling pressures with moderate PAH on RHC 3/18. LHC showed nonobstructive CAD.  NYHA III. Volume status stable. Continue 20 mg lasix daily.    Continue  2.5 mg bisoprolol but take a bed time.  Continue 12.5 mg losartan daily.  Hold off on spiro. SBP soft.  2. COPD Former heavy smoker. Moderate-severe emphysema on CT chest.  Referred to Dr Kendrick Fries today.  3.H/O AKI on CKD Stage III Check BMET today.  4. HTN Stable today continue current regimen.  5. PAH PA pressures >80 on echo - RHC 3/18:  Moderate pulmonary arterial hypertension, likely group 3 PH related to severe COPD. Dr Shirlee Latch advised not to treat with pulmonary vasodilators. Reinforced using oxygen 24 hours a day. Aim to keep O2 sats >than 90%.  Set up for sleep study.  Refer to Dr Kendrick Fries for [facility].  6. CAD Nonobstructive on LHC Continue aspirin and statin.   Referred to pulmonary for PAH, COPD, and sleep study.   Follow up 2-3 weeks with Dr Shirlee Latch.  Breyon Sigg NP-C  8:35 PM

## 2018-03-15 NOTE — Patient Instructions (Signed)
Routine lab work today. Will notify you of abnormal results, otherwise no news is good news!  Will refer you to [facility] for COPD. [address], [place] [address] Phone: [phone] Their office will contact you to schedule.  Follow up 2-3 weeks with Dr. Shirlee Latch.  ___________________________________________________________ [place] Code: 1100  Take all medication as prescribed the day of your appointment. Bring all medications with you to your appointment.  Do the following things EVERYDAY: 1) Weigh yourself in the morning before breakfast. Write it down and keep it in a log. 2) Take your medicines as prescribed 3) Eat low salt foods-Limit salt (sodium) to 2000 mg per day.  4) Stay as active as you can everyday 5) Limit all fluids for the day to less than 2 liters

## 2018-03-16 ENCOUNTER — Ambulatory Visit: Payer: Medicare Other | Admitting: Family Medicine

## 2018-03-16 NOTE — Telephone Encounter (Signed)
Last seen 03/09/18  Dr Dettinger 

## 2018-03-19 ENCOUNTER — Ambulatory Visit (INDEPENDENT_AMBULATORY_CARE_PROVIDER_SITE_OTHER): Payer: Medicare Other | Admitting: Family Medicine

## 2018-03-19 ENCOUNTER — Encounter: Payer: Self-pay | Admitting: Family Medicine

## 2018-03-19 VITALS — BP 97/68 | HR 101 | Temp 97.4°F | Ht 69.0 in | Wt 153.0 lb

## 2018-03-19 DIAGNOSIS — I5043 Acute on chronic combined systolic (congestive) and diastolic (congestive) heart failure: Secondary | ICD-10-CM

## 2018-03-19 DIAGNOSIS — R55 Syncope and collapse: Secondary | ICD-10-CM | POA: Diagnosis not present

## 2018-03-19 DIAGNOSIS — R05 Cough: Secondary | ICD-10-CM | POA: Diagnosis not present

## 2018-03-19 DIAGNOSIS — R059 Cough, unspecified: Secondary | ICD-10-CM

## 2018-03-19 MED ORDER — HYDROCODONE-HOMATROPINE 5-1.5 MG/5ML PO SYRP: 5.0000 mL | ORAL_SOLUTION | Freq: Four times a day (QID) | ORAL | 0 refills | Status: DC | PRN

## 2018-03-19 MED ORDER — FUROSEMIDE 40 MG PO TABS: 40.0000 mg | ORAL_TABLET | Freq: Two times a day (BID) | ORAL | 1 refills | Status: DC

## 2018-03-19 NOTE — Progress Notes (Signed)
BP 97/68   Pulse (!) 101   Temp (!) 97.4 F (36.3 C) (Oral)   Ht _0  (1.753 m)   Wt 153 lb (69.4 kg)   SpO2 (!) 86%   BMI 22.59 kg/m    Subjective:    Patient ID: Zachary [name], male    DOB: 09/15/1951, 67 y.o.   MRN: [id number]  HPI: Zachary [name] is a 67 y.o. male presenting on 03/19/2018 for Syncopal episodes (happens when he is coughing, coughing up phlegm; oxygen 86% on 4 liters) and Edema in feet   HPI Cough and syncope Patient is coming in for follow-up for congestive heart failure and for cough and syncope.  He says he has been starting to gain weight again and is up about 8 pounds from last week and he is starting to have a cough and he will cough until a couple times he is blacked out or nearly blacked out.  He says his cough is nonproductive.  He says he has felt a little short of breath but mostly on exertion which is about where he has been.  He denies any chest pain or fevers or chills.  He denies any sinus or nasal congestion or ear pain.  His legs are more swollen than they have been as well.  He has been taking his blood pressures and heart rates at home and his blood pressures been running on the lower side and his heart rate runs up a little bit and his oxygen saturation has been staying about 86% on 4 L and is not coming up more than that.  Recently his cardiologist had just increased his Lasix dose to 40 mg and he still feels like is not getting off enough fluid.  Relevant past medical, surgical, family and social history reviewed and updated as indicated. Interim medical history since our last visit reviewed. Allergies and medications reviewed and updated.  Review of Systems  Constitutional: Negative for chills and fever.  HENT: Positive for congestion. Negative for rhinorrhea, sinus pain, sore throat and voice change.   Respiratory: Positive for cough and shortness of breath. Negative for wheezing.   Cardiovascular: Positive for leg swelling. Negative  for chest pain and palpitations.  Musculoskeletal: Negative for back pain and gait problem.  Skin: Negative for rash.  All other systems reviewed and are negative.   Per HPI unless specifically indicated above   Allergies as of 03/19/2018      Reactions   Aspirin Other (See Comments)   Stomach burning       Medication List        Accurate as of 03/19/18  3:03 PM. Always use your most recent med list.          albuterol 108 (90 Base) MCG/ACT inhaler Commonly known as:  PROVENTIL HFA;VENTOLIN HFA Inhale 2 puffs into the lungs every 6 (six) hours as needed for wheezing or shortness of breath.   aspirin 81 MG chewable tablet Chew 1 tablet (81 mg total) by mouth daily.   atorvastatin 10 MG tablet Commonly known as:  LIPITOR Take 1 tablet (10 mg total) by mouth at bedtime.   budesonide-formoterol 160-4.5 MCG/ACT inhaler Commonly known as:  SYMBICORT Inhale 2 puffs into the lungs 2 (two) times daily.   furosemide 40 MG tablet Commonly known as:  LASIX Take 1 tablet (40 mg total) by mouth 2 (two) times daily.   HYDROcodone-homatropine 5-1.5 MG/5ML syrup Commonly known as:  HYCODAN Take 5 mLs by mouth  every 6 (six) hours as needed for cough.   hydroxypropyl methylcellulose / hypromellose 2.5 % ophthalmic solution Commonly known as:  ISOPTO TEARS / GONIOVISC Place 1 drop into both eyes as needed for dry eyes.   losartan 25 MG tablet Commonly known as:  COZAAR Take 0.5 tablets (12.5 mg total) by mouth daily.   omeprazole 40 MG capsule Commonly known as:  PRILOSEC TAKE 1 CAPSULE (40 MG TOTAL) BY MOUTH DAILY.   predniSONE 20 MG tablet Commonly known as:  DELTASONE TAKE 3 TABS DAILY FOR 1 WEEK, THEN 2 TABS DAILY FOR WEEK 2, THEN 1 TAB DAILY FOR WEEK 3.          Objective:    BP 97/68   Pulse (!) 101   Temp (!) 97.4 F (36.3 C) (Oral)   Ht _0  (1.753 m)   Wt 153 lb (69.4 kg)   SpO2 (!) 86%   BMI 22.59 kg/m   Wt Readings from Last 3 Encounters:  03/19/18  153 lb (69.4 kg)  03/15/18 146 lb (66.2 kg)  03/09/18 144 lb 8 oz (65.5 kg)    Physical Exam  Constitutional: He is oriented to person, place, and time. He appears well-developed and well-nourished. No distress.  Eyes: Conjunctivae are normal. No scleral icterus.  Neck: Neck supple. No thyromegaly present.  Cardiovascular: Normal rate, regular rhythm, normal heart sounds and intact distal pulses.  No murmur heard. Pulmonary/Chest: Effort normal. No respiratory distress. He has no wheezes. He has rales (Bilateral lower lobe crackles).  Musculoskeletal: Normal range of motion. He exhibits edema (1+ pitting edema in both lower extremities).  Lymphadenopathy:    He has no cervical adenopathy.  Neurological: He is alert and oriented to person, place, and time. Coordination normal.  Skin: Skin is warm and dry. No rash noted. He is not diaphoretic.  Psychiatric: He has a normal mood and affect. His behavior is normal.  Nursing note and vitals reviewed.       Assessment & Plan:   Problem List Items Addressed This Visit      Other   Bilateral leg edema - Primary   Relevant Medications   furosemide (LASIX) 40 MG tablet    Other Visit Diagnoses    Cough       Relevant Medications   HYDROcodone-homatropine (HYCODAN) 5-1.5 MG/5ML syrup   Other Relevant Orders   BMP8+EGFR (Completed)   Brain natriuretic peptide (Completed)   Vasovagal syncope       Relevant Medications   furosemide (LASIX) 40 MG tablet   Other Relevant Orders   BMP8+EGFR (Completed)   Brain natriuretic peptide (Completed)      Patient appears to be having a CHF exacerbation and his weight has been increasing, his home weights are up from baseline of 139 to now 146 and has been going up each day.  Recommended for him to double his Lasix again and go up to 40 mg twice daily and if does not improve then have him go to the emergency department.  His O2 sats are holding out between 80s and 90s, he is currently on 4 L nasal  cannula  Follow up plan: Return in about 1 week (around 03/26/2018), or if symptoms worsen or fail to improve, for Breathing recheck.  Counseling provided for all of the vaccine components Orders Placed This Encounter  Procedures  . BMP8+EGFR  . Brain natriuretic peptide    Caryl Pina, MD [facility] 03/19/2018, 3:03 PM

## 2018-03-20 ENCOUNTER — Telehealth: Payer: Self-pay | Admitting: Family Medicine

## 2018-03-20 ENCOUNTER — Ambulatory Visit (INDEPENDENT_AMBULATORY_CARE_PROVIDER_SITE_OTHER): Payer: Worker's Compensation | Admitting: Internal Medicine

## 2018-03-20 ENCOUNTER — Other Ambulatory Visit: Payer: Self-pay

## 2018-03-20 LAB — BMP8+EGFR
BUN/Creatinine Ratio: 20 (ref 10–24)
BUN: 31 mg/dL — AB (ref 8–27)
CALCIUM: 9.1 mg/dL (ref 8.6–10.2)
CO2: 23 mmol/L (ref 20–29)
CREATININE: 1.52 mg/dL — AB (ref 0.76–1.27)
Chloride: 103 mmol/L (ref 96–106)
GFR, EST AFRICAN AMERICAN: 54 mL/min/{1.73_m2} — AB (ref >59)
GFR, EST NON AFRICAN AMERICAN: 47 mL/min/{1.73_m2} — AB (ref >59)
Glucose: 118 mg/dL — ABNORMAL HIGH (ref 65–99)
POTASSIUM: 4.4 mmol/L (ref 3.5–5.2)
Sodium: 144 mmol/L (ref 134–144)

## 2018-03-20 LAB — BRAIN NATRIURETIC PEPTIDE: BNP: 524.5 pg/mL — ABNORMAL HIGH (ref 0.0–100.0)

## 2018-03-20 NOTE — Patient Outreach (Signed)
[facility]) Care Management  03/20/2018  Zachary [name] 1951-05-22 [id number]   Telephone Screen  Referral Date:03/07/18 Referral Source:[facility] Primary Care Navigator Referral Reason:" "further assessment of needs, provide information/health education and reinforce disease mgmt of HF and other chronic illness(COPD)" Insurance:[employer] Medicare     Multiple attempts to establish contact with patient without success. No response from letter mailed to patient. Case is being closed at this time.     Plan: RN CM will close case at this time.   Antionette Fairy, RN,BSN,CCM [facility] Care Management Telephonic Care Management Coordinator Direct Phone: [phone] Toll Free: [phone] Fax: [phone]

## 2018-03-21 ENCOUNTER — Encounter: Payer: Self-pay | Admitting: Internal Medicine

## 2018-03-21 ENCOUNTER — Ambulatory Visit (INDEPENDENT_AMBULATORY_CARE_PROVIDER_SITE_OTHER): Payer: Medicare Other | Admitting: Internal Medicine

## 2018-03-21 VITALS — BP 102/60 | HR 82 | Ht 69.0 in | Wt 150.0 lb

## 2018-03-21 DIAGNOSIS — I2781 Cor pulmonale (chronic): Secondary | ICD-10-CM

## 2018-03-21 DIAGNOSIS — J9611 Chronic respiratory failure with hypoxia: Secondary | ICD-10-CM | POA: Diagnosis not present

## 2018-03-21 DIAGNOSIS — J449 Chronic obstructive pulmonary disease, unspecified: Secondary | ICD-10-CM

## 2018-03-21 DIAGNOSIS — J441 Chronic obstructive pulmonary disease with (acute) exacerbation: Secondary | ICD-10-CM

## 2018-03-21 MED ORDER — BUDESONIDE-FORMOTEROL FUMARATE 160-4.5 MCG/ACT IN AERO: 2.0000 | INHALATION_SPRAY | Freq: Two times a day (BID) | RESPIRATORY_TRACT | 0 refills | Status: DC

## 2018-03-21 NOTE — Telephone Encounter (Signed)
Attempted to contact patient - NVM 

## 2018-03-21 NOTE — Patient Instructions (Addendum)
See Dr Dettinger to consider Starting  on aldactone next visit but it will require careful monitoring of your potassium and kidney function    Plan A = Automatic = symbicort 160 Take 2 puffs first thing in am and then another 2 puffs about 12 hours later.    Work on inhaler technique:  relax and gently blow all the way out then take a nice smooth deep breath back in, triggering the inhaler at same time you start breathing in.  Hold for up to 5 seconds if you can. Blow out thru nose. Rinse and gargle with water when done      Plan B = Backup Only use your albuterol as a rescue medication to be used if you can't catch your breath by resting or doing a relaxed purse lip breathing pattern.  - The less you use it, the better it will work when you need it. - Ok to use the inhaler up to 2 puffs  every 4 hours if you must but call for appointment if use goes up over your usual need - Don't leave home without it !!  (think of it like the spare tire for your car)   Keep 02 at 4 lpm 24/7   Please schedule a follow up office visit in 2 weeks, sooner if needed  with all medications /inhalers/ solutions in hand so we can verify exactly what you are taking. This includes all medications from all doctors and over the counters  - PFTS's on return

## 2018-03-21 NOTE — Progress Notes (Signed)
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 Dx of asbestos by Dr Valorie Roosevelt with new R ant cp since May 2017 with dx of pna but still coughing with persistent changes on serial CT so referred to pulmonary clinic 11/28/2016 by Dr   Valorie Roosevelt   History of Present Illness  11/28/2016 1st [facility] office visit/ Deena Shaub   Chief Complaint  Patient presents with  . Pulmonary Consult    Referred by Dr. Maree Erie. He states he had PNA in May 2017 and he has had SOB and cough since then. His cough is prod with white, thick, foamy sputum. He has also had some wt loss unintentionally.   Pt very difficult historian as focuses on his imaging, not his symptoms, but records indicate an acute illness early May 2017 eval by [facility] 04/29/16 with bad sweats, cough productive of yellow mucus rx x 7 days levaquin and then later developed ant R CP but eventually this resolved but cough did not/ TB skins test neg per pt  Cough is worse but not more productive in am's and is clear mucus maybe a couple of tbsp 5-6 x per day vs 10/hour and much greater volume before abx Last abx of September Last cxr in Nov 2017  On BREO can't tell it helps  rec Try off BREO The key is to stop smoking completely before smoking completely stops you!  Please remember to go to the lab and x-ray department downstairs for your tests - we will call you with the results when they are available.     Come to outpatient registration at [facility] (behind the ER) at 715 am Wednesday  12/07/16 with nothing to eat or drink after midnight Tuesday for Bronchoscopy > nl airway / bal/tbbx some atypical cells > referred to Dr Dorris Fetch who rec serial f/u (under his watch)    Admit date: 02/28/2018 Discharge date: 03/06/2018  Primary Care Physician:   Dettinger, Elige Radon, MD   Equipment/Devices: Likely will need home O2, home O2 evaluation pending  Discharge Condition: stable  CODE STATUS:  DNR   Diet recommendation: Heart healthy diet   Discharge Diagnoses:   Acute systolic CHF with right heart failure Pulmonary hypertension with cor pulmonale = WHO group III Acute respiratory failure with hypoxia . Essential hypertension, benign . Emphysema of lung (HCC) Mild acute on chronic kidney disease stage III  Consults:  Cardiology/CHF team   Pulmonology    DISCHARGE MEDICATIONS:     Allergies as of 03/06/2018      Reactions   Aspirin Other (See Comments)   Stomach burning               Medication List     STOP taking these medications   ALEVE PM 220-25 MG Tabs Generic drug:  Naproxen Sod-diphenhydrAMINE   hydrochlorothiazide 12.5 MG capsule Commonly known as:  MICROZIDE   predniSONE 20 MG tablet Commonly known as:  DELTASONE     TAKE these medications   albuterol 108 (90 Base) MCG/ACT inhaler Commonly known as:  PROVENTIL HFA;VENTOLIN HFA Inhale 2 puffs into the lungs every 6 (six) hours as needed for wheezing or shortness of breath.   aspirin 81 MG chewable tablet Chew 1 tablet (81 mg total) by mouth daily.  atorvastatin 10 MG tablet Commonly known as:  LIPITOR Take 1 tablet (10 mg total) by mouth at bedtime.   bisoprolol 5 MG tablet Commonly known as:  ZEBETA Take 0.5 tablets (2.5 mg total) by mouth daily. Start taking on:  03/07/2018   budesonide-formoterol 160-4.5 MCG/ACT inhaler Commonly known as:  SYMBICORT Inhale 2 puffs into the lungs 2 (two) times daily.   furosemide 20 MG tablet Commonly known as:  LASIX Take 1 tablet (20 mg total) by mouth daily.   hydroxypropyl methylcellulose / hypromellose 2.5 % ophthalmic solution Commonly known as:  ISOPTO TEARS / GONIOVISC Place 1 drop into both eyes as needed for dry eyes.   losartan 25 MG tablet Commonly known as:   COZAAR Take 1 tablet (25 mg total) by mouth daily.   omeprazole 40 MG capsule Commonly known as:  PRILOSEC TAKE 1 CAPSULE (40 MG TOTAL) BY MOUTH DAILY.        Brief H and P: For complete details please refer to admission H and P, but in brief 67 year old male with a history of COPD, hypertension, GERD presenting with one month history of shortness of breath and worsening lower extremity edema. 2 weeks ago, the patient was seen by his PCP and started on Symbicort, albuterol, prednisone, and azithromycin. He was noted to have oxygen saturation of 87% on RA in the office. He followed up in the office on 02/28/2018. His symptoms did not improve and was tachycardic with oxygen sats in the 70s. Patient was recommended to go to the ED. Complaining of orthopnea, dyspnea on exertion for the last 2-3 weeks. Smoked heavily, quit 2 years ago with 100-pack-year history of tobacco. Patient was transferred to [facility] due to CHF, started on IV furosemide.  Hospital Course:  Acute systolic CHF (congestive heart failure) (HCC)with right heart failure, pulmonary hypertension -Patient was placed on IV Lasix for diuresis and had significant improvement with symptoms and peripheral edema.   -2D echo on 02/28/18 showed EF of 40-45% with moderate to severe TR, PA pressure 84, moderate reduction in the RV function -Negative balance of8.0L, weight down from 151lbs on admission->132 lbs at discharge -Patient underwent right and left heart catheterization which showed normal right and left heart filling pressures, moderate pulmonary arterial hypertension likely group 3 PAH related to severe COPD, nonobstructive coronary artery disease. -Home O2 evaluation atdischarge -Patient was seen by CHF team, recommended aspirin 81, Lipitor 10 mg daily, bisoprolol 2.5 mg daily, losartan 25 mg daily, Lasix 20 mg daily at discharge   Acute respiratory failure with hypoxia likely due to acute systolic  CHF, cor pulmonale and underlying COPD, severe pulmonary hypertension -CT angiogram of the chest negative for PE, right-sided CHF with right upper lung bronchiectasis -Continue O2, scheduled nebs, Dulera, -VQ scan was negative for chronic pulmonary embolism, showed overall pattern of ventilation perfusion most consistent with COPD and right upper lobe scarring/volume loss.  Essential hypertension, benign -Currently stable, continue hydralazine, Lasix  Mild acute on CKD (chronic kidney disease) stage 3, GFR 30-59 ml/min (HCC) -Baseline creatinine 1.1-1.3 -Creatinine stable 1.2 at discharge, continueLasix   Day of Discharge S: Feeling better, eager to go home  BP (!) 83/63   Pulse (!) 117   Temp 98.7 F (37.1 C) (Oral)   Resp 17   Ht 5\' 9"  (1.753 m)   Wt 60 kg (132 lb 3.2 oz)   SpO2 92%   BMI 19.52 kg/m   Physical Exam: General: Alert and awake oriented x3 not in  any acute distress. HEENT: anicteric sclera, pupils reactive to light and accommodation CVS: S1-S2 clear no murmur rubs or gallops Chest: Diminished breath sounds Abdomen: soft nontender, nondistended, normal bowel sounds Extremities: no cyanosis, clubbing or edema noted bilaterally Neuro: Cranial nerves II-XII intact, no focal neurological deficits   The results of significant diagnostics from this hospitalization (including imaging, microbiology, ancillary and laboratory) are listed below for reference.      Procedures/Studies:  Study Conclusions ECHO  - Left ventricle: The cavity size was normal. There was mild focal basal hypertrophy of the septum. Systolic function was mildly to moderately reduced. The estimated ejection fraction was in the range of 40% to 45%. LVEF and wall motion difficult to assess in setting of tachycardia and abnormal septal motion. Possible hypokinesis of the inferior and apical myocardium. The study is not technically sufficient to allow evaluation of  LV diastolic function. - Ventricular septum: The contour showed diastolic flattening and systolic flattening consistent with right ventricular volume and pressure overload.  RIGHT/LEFT HEART CATH AND CORONARY ANGIOGRAPHY  Conclusion   1. Low right and left heart filling pressures.  2. Moderate pulmonary arterial hypertension, likely group 3 PH related to severe COPD.  3. Nonobstructive coronary disease.   Transition to po Lasix.   Would not treat with pulmonary vasodilators.      03/21/2018  Acute extended f/u ov/Tycho Cheramie re: worse sob/ cough / new resp failure/ cor pulmonale  Chief Complaint  Patient presents with  . Acute Visit    Pt c/o increased dyspnea and cough since Jan 2019.  He states he is coughing all the time, occ prod with white, foamy sputum. He states that he is SOB with minimal exertion such as just walking from room to room at home. He has been swelling is his legs.   Dyspnea:  Worse x one year/ quit smoking x one month prior to OV   Cough: x one year more day > noct  With cough syncope Sleep: ok flat  SABA use:  Helps some MMRC4  = sob if tries to leave home or while getting dressed     No obvious day to day or daytime variability or assoc excess/ purulent sputum or mucus plugs or hemoptysis or cp or chest tightness, subjective wheeze or overt sinus or hb symptoms. No unusual exposure hx or h/o childhood pna/ asthma or knowledge of premature birth.  Sleeping ok flat without nocturnal  or early am exacerbation  of respiratory  c/o's or need for noct saba. Also denies any obvious fluctuation of symptoms with weather or environmental changes or other aggravating or alleviating factors except as outlined above   Current Allergies, Complete Past Medical History, Past Surgical History, Family History, and Social History were reviewed in [employer] [facility] record.  ROS  The following are not active complaints unless bolded Hoarseness, sore throat,  dysphagia, dental problems, itching, sneezing,  nasal congestion or discharge of excess mucus or purulent secretions, ear ache,   fever, chills, sweats, unintended wt loss or wt gain, classically pleuritic or exertional cp,  orthopnea pnd or leg swelling, presyncope, palpitations, abdominal pain, anorexia, nausea, vomiting, diarrhea  or change in bowel habits or change in bladder habits, change in stools or change in urine, dysuria, hematuria,  rash, arthralgias, visual complaints, headache, numbness, weakness or ataxia or problems with walking or coordination,  change in mood/affect or memory.        Current Meds  Medication Sig  . albuterol (PROVENTIL HFA;VENTOLIN HFA)  108 (90 Base) MCG/ACT inhaler Inhale 2 puffs into the lungs every 6 (six) hours as needed for wheezing or shortness of breath.  Marland Kitchen aspirin 81 MG chewable tablet Chew 1 tablet (81 mg total) by mouth daily.  Marland Kitchen atorvastatin (LIPITOR) 10 MG tablet Take 1 tablet (10 mg total) by mouth at bedtime.  . budesonide-formoterol (SYMBICORT) 160-4.5 MCG/ACT inhaler Inhale 2 puffs into the lungs 2 (two) times daily.  . furosemide (LASIX) 40 MG tablet Take 1 tablet (40 mg total) by mouth 2 (two) times daily.  Marland Kitchen HYDROcodone-homatropine (HYCODAN) 5-1.5 MG/5ML syrup Take 5 mLs by mouth every 6 (six) hours as needed for cough.  . losartan (COZAAR) 25 MG tablet Take 0.5 tablets (12.5 mg total) by mouth daily.  Marland Kitchen omeprazole (PRILOSEC) 40 MG capsule TAKE 1 CAPSULE (40 MG TOTAL) BY MOUTH DAILY.  Marland Kitchen OXYGEN 4lpm 24/7  [facility]  . predniSONE (DELTASONE) 20 MG tablet TAKE 3 TABS DAILY FOR 1 WEEK, THEN 2 TABS DAILY FOR WEEK 2, THEN 1 TAB DAILY FOR WEEK 3.  .   budesonide-formoterol (SYMBICORT) 160-4.5 MCG/ACT inhaler Inhale 2 puffs into the lungs 2 (two) times daily.                   Objective:   Physical Exam   amb bm nad   03/21/2018         150   11/28/16 156 lb 12.8 oz (71.1 kg)  06/22/16 144 lb 9.6 oz (65.6 kg)  05/21/16 141 lb 6.4 oz (64.1  kg)      Vital signs reviewed - Note on arrival 02 sats  97% on 6lpm and 90 on 4lpm and bp 102/60        HEENT: nl dentition / oropharynx. Nl external ear canals without cough reflex - moderate bilateral non-specific turbinate edema     NECK :  without JVD/Nodes/TM/ nl carotid upstrokes bilaterally   LUNGS: no acc muscle use,  Mod barrel  contour chest wall with bilateral  Distant bs s audible wheeze and  without cough on insp or exp maneuver and mod   Hyperresonant  to  percussion bilaterally     CV:  RRR  no s3 or murmur or increase in P2, and  2+ pitting both LE's   ABD:  soft and nontender with pos mid insp Hoover's  in the supine position. No bruits or organomegaly appreciated, bowel sounds nl  MS:   Nl gait/  ext warm without deformities, calf tenderness, cyanosis or clubbing No obvious joint restrictions   SKIN: warm and dry without lesions    NEURO:  alert, approp, nl sensorium with  no motor or cerebellar deficits apparent.         I personally reviewed images and agree with radiology impression as follows:  CXR:   03/05/18 Changes of COPD, RIGHT upper lobe scarring/volume loss, and bibasilar interstitial fibrosis which appear grossly stable since prior exam.   V/q:  03/05/18 Overall pattern of ventilation and perfusion is most consistent with COPD and RIGHT upper lobe scarring/volume loss.    CTa 03/01/18 1. No pulmonary embolism. 2. Mild cardiomegaly with enlarged right cardiac chambers. Contrast reflux into the IVC and hepatic veins. Findings suggest right heart failure. 3. Stable dilated main pulmonary artery, suggesting chronic pulmonary arterial hypertension. 4. Trace dependent right pleural effusion. 5. Stable bandlike subpleural consolidation, volume loss, distortion and bronchiectasis in the peripheral right upper lobe. Morphologically, this finding is most compatible with pleural-parenchymal scarring such as due  to chronic  granulomatous infection. Continued chest CT surveillance warranted in 6-12 months. 6. Moderate to severe emphysema with mild diffuse bronchial wall thickening, suggesting COPD.    Assessment:

## 2018-03-22 ENCOUNTER — Encounter: Payer: Self-pay | Admitting: Internal Medicine

## 2018-03-22 DIAGNOSIS — J9611 Chronic respiratory failure with hypoxia: Secondary | ICD-10-CM | POA: Insufficient documentation

## 2018-03-22 DIAGNOSIS — J449 Chronic obstructive pulmonary disease, unspecified: Secondary | ICD-10-CM | POA: Insufficient documentation

## 2018-03-22 NOTE — Assessment & Plan Note (Signed)
Started on 02 at d/c 03/06/18 at 4lpm 24/7   - not hypercarbic, ok to titrate to keep well above 90% due to cor pulmonale

## 2018-03-22 NOTE — Assessment & Plan Note (Addendum)
Spirometry 11/28/2016  FEV1 2.29 (75%)  Ratio 55  On BREO> no change symptoms 01/03/2017 off bre - quit smoking 02/2018 with onset of resp failure/ cor pulmonale - 03/21/2018  After extensive coaching inhaler device  effectiveness =    75% from a basline of 25%    Suspect this has progressed to at least GOLD III and probably  Group D in terms of symptom/risk and laba/lama/ICS  therefore appropriate rx at this point but will start with just laba/ics until he returns for pfts and add lama then or change to lama/laba only if he proves to have  more of a Group B pattern of symptoms (s AB component)   I had an extended discussion with the patient/wife  reviewing all relevant studies completed to date(including extensive inpt records)  and  lasting 25 minutes of a 40  minute post hosp f/u office  visit     re  severe non-specific but potentially very serious refractory respiratory symptoms of uncertain and potentially multiple  etiologies.   Each maintenance medication was reviewed in detail including most importantly the difference between maintenance and prns and under what circumstances the prns are to be triggered using an action plan format that is not reflected in the computer generated alphabetically organized AVS.    Please see AVS for specific instructions unique to this office visit that I personally wrote and verbalized to the the pt in detail and then reviewed with pt  by my nurse highlighting any changes in therapy/plan of care  recommended at today's visit.

## 2018-03-22 NOTE — Assessment & Plan Note (Signed)
Classic pattern cor pulmonale > consider adding aldactone but prefer PCP do this locally as will need careful monitoring of bun/creat/ K  > Dr Dettinger more than capable of managing this component of the problem and will defer to his judgement/ care.

## 2018-03-26 ENCOUNTER — Ambulatory Visit (HOSPITAL_COMMUNITY)
Admission: RE | Admit: 2018-03-26 | Discharge: 2018-03-26 | Disposition: A | Discharge status: Home / Self Care | Payer: Medicare Other | Source: Ambulatory Visit | Attending: Cardiology | Admitting: Cardiology

## 2018-03-26 ENCOUNTER — Other Ambulatory Visit: Payer: Self-pay

## 2018-03-26 ENCOUNTER — Encounter (HOSPITAL_COMMUNITY): Payer: Self-pay | Admitting: Cardiology

## 2018-03-26 VITALS — BP 109/83 | HR 92 | Wt 149.2 lb

## 2018-03-26 DIAGNOSIS — Z77090 Contact with and (suspected) exposure to asbestos: Secondary | ICD-10-CM | POA: Insufficient documentation

## 2018-03-26 DIAGNOSIS — Z9981 Dependence on supplemental oxygen: Secondary | ICD-10-CM | POA: Insufficient documentation

## 2018-03-26 DIAGNOSIS — I5043 Acute on chronic combined systolic (congestive) and diastolic (congestive) heart failure: Secondary | ICD-10-CM

## 2018-03-26 DIAGNOSIS — I272 Pulmonary hypertension, unspecified: Secondary | ICD-10-CM | POA: Diagnosis not present

## 2018-03-26 DIAGNOSIS — J439 Emphysema, unspecified: Secondary | ICD-10-CM

## 2018-03-26 DIAGNOSIS — Z79899 Other long term (current) drug therapy: Secondary | ICD-10-CM | POA: Insufficient documentation

## 2018-03-26 DIAGNOSIS — N183 Chronic kidney disease, stage 3 unspecified: Secondary | ICD-10-CM

## 2018-03-26 DIAGNOSIS — I2510 Atherosclerotic heart disease of native coronary artery without angina pectoris: Secondary | ICD-10-CM | POA: Diagnosis not present

## 2018-03-26 DIAGNOSIS — I5032 Chronic diastolic (congestive) heart failure: Secondary | ICD-10-CM | POA: Diagnosis not present

## 2018-03-26 DIAGNOSIS — Z7982 Long term (current) use of aspirin: Secondary | ICD-10-CM | POA: Insufficient documentation

## 2018-03-26 DIAGNOSIS — Z87891 Personal history of nicotine dependence: Secondary | ICD-10-CM | POA: Insufficient documentation

## 2018-03-26 DIAGNOSIS — R918 Other nonspecific abnormal finding of lung field: Secondary | ICD-10-CM | POA: Insufficient documentation

## 2018-03-26 DIAGNOSIS — I130 Hypertensive heart and chronic kidney disease with heart failure and stage 1 through stage 4 chronic kidney disease, or unspecified chronic kidney disease: Secondary | ICD-10-CM | POA: Diagnosis not present

## 2018-03-26 DIAGNOSIS — J449 Chronic obstructive pulmonary disease, unspecified: Secondary | ICD-10-CM | POA: Diagnosis not present

## 2018-03-26 DIAGNOSIS — I5022 Chronic systolic (congestive) heart failure: Secondary | ICD-10-CM | POA: Diagnosis not present

## 2018-03-26 DIAGNOSIS — Z7951 Long term (current) use of inhaled steroids: Secondary | ICD-10-CM | POA: Insufficient documentation

## 2018-03-26 DIAGNOSIS — K219 Gastro-esophageal reflux disease without esophagitis: Secondary | ICD-10-CM | POA: Insufficient documentation

## 2018-03-26 DIAGNOSIS — I428 Other cardiomyopathies: Secondary | ICD-10-CM | POA: Insufficient documentation

## 2018-03-26 DIAGNOSIS — Z8249 Family history of ischemic heart disease and other diseases of the circulatory system: Secondary | ICD-10-CM | POA: Insufficient documentation

## 2018-03-26 MED ORDER — SPIRONOLACTONE 25 MG PO TABS: 12.5000 mg | ORAL_TABLET | Freq: Every day | ORAL | 3 refills | Status: AC

## 2018-03-26 MED ORDER — SPIRONOLACTONE 25 MG PO TABS: 12.5000 mg | ORAL_TABLET | Freq: Every day | ORAL | 3 refills | Status: DC

## 2018-03-26 MED ORDER — FUROSEMIDE 40 MG PO TABS: ORAL_TABLET | ORAL | 3 refills | Status: DC

## 2018-03-26 NOTE — Patient Instructions (Signed)
START taking Spironolactone 12.5 mg (0.5 Tablet) Once Daily  INCREASE lasix to 60 mg (1.5 Tablets) Twice Daily for 2 days only, then decrease to 60 mg (1.5 Tablet) in the AM and 40 mg (1 Tablet) in the PM.   Please have labs drawn in 1 week from today at your PCP's Office.   Follow up in [facility] in 1 Month.  Follow up with Dr. Shirlee Latch in 2 Months.

## 2018-03-26 NOTE — Progress Notes (Signed)
PCP: Dr Dettinger  Primary Cardiologist: Dr Shirlee Latch   HPI: Zachary Scales [place] a 67 y.o.malewith a history of COPD, HTN, and chronic RUL infiltrate.  Admitted 3/13 through 03/06/2018. Echo in 3/19 showed EF 40-45% with moderately decreased RV systolic function.  Admitted with increased dyspnea. Diuresed with IV lasix and transitioned to 20 mg lasix daily. Had RHC with low filling pressures and moderate PAH related to severe COPD. LHC showed nonobstructive CAD.  He was discharged with home oxygen and plans for outpatient sleep study. Discharge weight 132 pounds.   Today he returns for HF follow up. Weight is up 3 lbs since last appointment.  He remains on 4L home oxygen.  He feels about the same, oxygen saturation staying in the 90s.  He is short of breath with stairs/inclines.  Can do ADLs and walk on flat ground for short distances without much dyspnea. He gets lightheaded when he gets out of bed in the morning.  No falls.  No palpitations.  No chest pain.    Labs (4/19): K 4.4, creatinine 1.52, BNP 524  ECG (3/19, personally reviewed): sinus tachycardia, RVH with repolarization abnormality.   PMH: 1. COPD: Prior smoker.  On home oxygen.  - PFTs (2/18) with moderate obstructive airways disease.  - CT chest 3/19 with emphysema.  2. Chronic RUL consolidation: Biopsy did not show malignancy (Dr. Dorris Fetch).  3. GERD 4. HTN 5. Asbestos exposure.  6. Pulmonary hypertension: Suspect group 3 due to severe COPD with hypoxemia.   - RHC (3/19) with mean RA 3, PA 52/10 mean 32, mean PCWP 5, CI 2.3, PVR 6.7 WU.  - CTA chest (3/19) with no PE.  - V/Q scan 3/19 with no evidence for chronic PE.  7. Chronic systolic CHF with prominent RV failure: Nonischemic cardiomyopathy.  - LHC (3/19) with nonobstructive CAD.  - Echo (3/19) with mild focal basal septal hypertrophy, EF 40-45%, D-shaped interventricular septum, moderate-severe TR, severely dilated RV with moderately decreased systolic function.    8. CKD: Stage 3.   ROS: All systems negative except as listed in HPI, PMH and Problem List.  Social History   Socioeconomic History  . Marital status: Married    Spouse name: Not on file  . Number of children: Not on file  . Years of education: Not on file  . Highest education level: Not on file  Occupational History  . Not on file  Social Needs  . Financial resource strain: Not on file  . Food insecurity:    Worry: Not on file    Inability: Not on file  . Transportation needs:    Medical: Not on file    Non-medical: Not on file  Tobacco Use  . Smoking status: Former Smoker    Packs/day: 1.00    Years: 40.00    Pack years: 40.00    Types: Cigarettes    Start date: 01/27/1974    Last attempt to quit: 02/16/2018    Years since quitting: 0.1  . Smokeless tobacco: Never Used  Substance and Sexual Activity  . Alcohol use: No  . Drug use: No  . Sexual activity: Not on file  Lifestyle  . Physical activity:    Days per week: Not on file    Minutes per session: Not on file  . Stress: Not on file  Relationships  . Social connections:    Talks on phone: Not on file    Gets together: Not on file    Attends religious service: Not on  file    Active member of club or organization: Not on file    Attends meetings of clubs or organizations: Not on file    Relationship status: Not on file  . Intimate partner violence:    Fear of current or ex partner: Not on file    Emotionally abused: Not on file    Physically abused: Not on file    Forced sexual activity: Not on file  Other Topics Concern  . Not on file  Social History Narrative  . Not on file    Family History  Problem Relation Age of Onset  . Heart disease Father     Current Outpatient Medications  Medication Sig Dispense Refill  . albuterol (PROVENTIL HFA;VENTOLIN HFA) 108 (90 Base) MCG/ACT inhaler Inhale 2 puffs into the lungs every 6 (six) hours as needed for wheezing or shortness of breath. 1 Inhaler 0  .  aspirin 81 MG chewable tablet Chew 1 tablet (81 mg total) by mouth daily. 30 tablet 3  . atorvastatin (LIPITOR) 10 MG tablet Take 1 tablet (10 mg total) by mouth at bedtime. 30 tablet 3  . bisoprolol (ZEBETA) 5 MG tablet Take 2.5 mg by mouth daily.    . budesonide-formoterol (SYMBICORT) 160-4.5 MCG/ACT inhaler Inhale 2 puffs into the lungs 2 (two) times daily. 1 Inhaler 0  . furosemide (LASIX) 40 MG tablet Take 60 mg (1.5 Tablet) in the AM and 40 mg (1 Tablet) in the PM. 75 tablet 3  . HYDROcodone-homatropine (HYCODAN) 5-1.5 MG/5ML syrup Take 5 mLs by mouth every 6 (six) hours as needed for cough. 120 mL 0  . losartan (COZAAR) 25 MG tablet Take 25 mg by mouth at bedtime.    Marland Kitchen omeprazole (PRILOSEC) 40 MG capsule TAKE 1 CAPSULE (40 MG TOTAL) BY MOUTH DAILY. 90 capsule 1  . OXYGEN 4lpm 24/7  [facility]    . spironolactone (ALDACTONE) 25 MG tablet Take 0.5 tablets (12.5 mg total) by mouth daily. 45 tablet 3   No current facility-administered medications for this encounter.     Vitals:   03/26/18 1100  BP: 109/83  Pulse: 92  SpO2: (!) 88%  Weight: 149 lb 4 oz (67.7 kg)    PHYSICAL EXAM: General: NAD Neck: JVP 10 cm, no thyromegaly or thyroid nodule.  Lungs: Decreased breath sounds bilaterally.  CV: Nondisplaced PMI.  Heart regular S1/S2, no S3/S4, no murmur.  1+ ankle edema, L>R.  No carotid bruit.  Normal pedal pulses.  Abdomen: Soft, nontender, no hepatosplenomegaly, no distention.  Skin: Intact without lesions or rashes.  Neurologic: Alert and oriented x 3.  Psych: Normal affect. Extremities: No clubbing or cyanosis.  HEENT: Normal.   ASSESSMENT & PLAN:  1. Chronic Systolic Heart Failure: With prominent RV dysfunction. Nonischemic cardiomyopathy.  Echo 02/28/2018 with EF 40-45%, severe dilation of RV with moderate systolic dysfunction, severe dilation of right atrium, mod-severe TR, PA peak pressure 84.Low filling pressures with moderate PAH on RHC 3/18. LHC showed nonobstructive CAD.   NYHA class III symptoms.  He is volume overloaded on exam, suspect primarily RV failure.  - Increase Lasix to 60 mg bid x 2 days then 60 qam/40 qpm.   - Continue bisoprolol 2.5 mg daily and losartan 25 mg daily.  Will have to be careful with titration given orthostatic symptoms.  - Start spironolactone 12.5 mg daily.  - Will need to follow creatinine closely, BMET in 1 week.  2. COPD: Former heavy smoker. Moderate-severe emphysema on CT chest.  - Followed  by pulmonology.  - On home oxygen.  3. CKD stage 3: Follow BMET closely with diuresis.  4. Pulmonary HTN: Suspect primarily group 3 from severe COPD with hypoxemia.  V/Q scan was not suggestive of chronic PE.  - Needs sleep study.   5. CAD: Nonobstructive on LHC - Continue aspirin and statin.  6. RUL consolidation: Chronic, followed by pulmonary.   Followup with NP/PA in 1 month, see me in 2 months.   Marca Ancona 03/26/2018

## 2018-03-28 ENCOUNTER — Encounter: Payer: Self-pay | Admitting: Family Medicine

## 2018-03-28 ENCOUNTER — Ambulatory Visit (INDEPENDENT_AMBULATORY_CARE_PROVIDER_SITE_OTHER): Payer: Medicare Other | Admitting: Family Medicine

## 2018-03-28 VITALS — BP 112/79 | HR 83 | Temp 98.1°F | Ht 69.0 in | Wt 149.0 lb

## 2018-03-28 DIAGNOSIS — N183 Chronic kidney disease, stage 3 unspecified: Secondary | ICD-10-CM

## 2018-03-28 DIAGNOSIS — R0602 Shortness of breath: Secondary | ICD-10-CM | POA: Diagnosis not present

## 2018-03-28 DIAGNOSIS — I50812 Chronic right heart failure: Secondary | ICD-10-CM

## 2018-03-28 NOTE — Progress Notes (Signed)
BP 112/79   Pulse 83   Temp 98.1 F (36.7 C) (Oral)   Ht 5' 9"  (1.753 m)   Wt 149 lb (67.6 kg)   SpO2 91% Comment: on oxygen  BMI 22.00 kg/m    Subjective:    Patient ID: Zachary [name], male    DOB: 04-28-1951, 66 y.o.   MRN: [id number]  HPI: Zachary [name] is a 67 y.o. male presenting on 03/28/2018 for one week follow up   HPI Shortness of breath and CHF and CKD recheck Patient is coming in today for shortness of breath and CHF and COPD recheck.  Between the time that he saw me last in this visit he has seen both cardiology and pulmonology.  They both feel like things are improving.  Cardiology did increase his Lasix to 60 twice daily for couple days and then down to 40 at bedtime and 60 in the a.m.  His weight is finally down and his breathing is starting to do better.  He is 91% on 4 L and he says at home on 4 L he often is up around 92/90 3%.  He says that his swelling is down.  Relevant past medical, surgical, family and social history reviewed and updated as indicated. Interim medical history since our last visit reviewed. Allergies and medications reviewed and updated.  Review of Systems  Constitutional: Negative for chills and fever.  HENT: Negative for congestion, sinus pressure, sinus pain and sore throat.   Eyes: Negative for discharge.  Respiratory: Positive for cough and shortness of breath. Negative for wheezing.   Cardiovascular: Positive for leg swelling (Leg swelling has improved and is mostly just in the ankles). Negative for chest pain.  Musculoskeletal: Negative for back pain and gait problem.  Skin: Negative for rash.  All other systems reviewed and are negative.   Per HPI unless specifically indicated above   Allergies as of 03/28/2018      Reactions   Aspirin Other (See Comments)   Stomach burning       Medication List        Accurate as of 03/28/18  3:06 PM. Always use your most recent med list.          albuterol 108 (90 Base)  MCG/ACT inhaler Commonly known as:  PROVENTIL HFA;VENTOLIN HFA Inhale 2 puffs into the lungs every 6 (six) hours as needed for wheezing or shortness of breath.   aspirin 81 MG chewable tablet Chew 1 tablet (81 mg total) by mouth daily.   atorvastatin 10 MG tablet Commonly known as:  LIPITOR Take 1 tablet (10 mg total) by mouth at bedtime.   bisoprolol 5 MG tablet Commonly known as:  ZEBETA Take 2.5 mg by mouth daily.   budesonide-formoterol 160-4.5 MCG/ACT inhaler Commonly known as:  SYMBICORT Inhale 2 puffs into the lungs 2 (two) times daily.   furosemide 40 MG tablet Commonly known as:  LASIX Take 60 mg (1.5 Tablet) in the AM and 40 mg (1 Tablet) in the PM.   HYDROcodone-homatropine 5-1.5 MG/5ML syrup Commonly known as:  HYCODAN Take 5 mLs by mouth every 6 (six) hours as needed for cough.   losartan 25 MG tablet Commonly known as:  COZAAR Take 25 mg by mouth at bedtime.   omeprazole 40 MG capsule Commonly known as:  PRILOSEC TAKE 1 CAPSULE (40 MG TOTAL) BY MOUTH DAILY.   OXYGEN 4lpm 24/7  [employer]   spironolactone 25 MG tablet Commonly known as:  ALDACTONE Take 0.5  tablets (12.5 mg total) by mouth daily.          Objective:    BP 112/79   Pulse 83   Temp 98.1 F (36.7 C) (Oral)   Ht 5' 9"  (1.753 m)   Wt 149 lb (67.6 kg)   SpO2 91% Comment: on oxygen  BMI 22.00 kg/m   Wt Readings from Last 3 Encounters:  03/28/18 149 lb (67.6 kg)  03/26/18 149 lb 4 oz (67.7 kg)  03/21/18 150 lb (68 kg)    Physical Exam  Constitutional: He is oriented to person, place, and time. He appears well-developed and well-nourished. No distress.  Eyes: Conjunctivae are normal. No scleral icterus.  Neck: Neck supple. No thyromegaly present.  Cardiovascular: Normal rate, regular rhythm, normal heart sounds and intact distal pulses.  No murmur heard. Pulmonary/Chest: Effort normal. No respiratory distress. He has no wheezes. He has rales (Bibasilar crackles, improved from  previous).  Musculoskeletal: Normal range of motion. He exhibits edema (2+ edema in feet and ankles but none in legs).  Lymphadenopathy:    He has no cervical adenopathy.  Neurological: He is alert and oriented to person, place, and time. Coordination normal.  Skin: Skin is warm and dry. No rash noted. He is not diaphoretic.  Psychiatric: He has a normal mood and affect. His behavior is normal.  Nursing note and vitals reviewed.   Results for orders placed or performed in visit on 03/19/18  [facility]  Result Value Ref Range   Glucose 118 (H) 65 - 99 mg/dL   BUN 31 (H) 8 - 27 mg/dL   Creatinine, Ser 1.52 (H) 0.76 - 1.27 mg/dL   GFR calc non Af Amer 47 (L) >59 mL/min/1.73   GFR calc Af Amer 54 (L) >59 mL/min/1.73   BUN/Creatinine Ratio 20 10 - 24   Sodium 144 134 - 144 mmol/L   Potassium 4.4 3.5 - 5.2 mmol/L   Chloride 103 96 - 106 mmol/L   CO2 23 20 - 29 mmol/L   Calcium 9.1 8.6 - 10.2 mg/dL  Brain natriuretic peptide  Result Value Ref Range   BNP 524.5 (H) 0.0 - 100.0 pg/mL      Assessment & Plan:   Problem List Items Addressed This Visit      Cardiovascular and Mediastinum   CHF (congestive heart failure) (HCC)   Relevant Orders   BMP8+EGFR     Genitourinary   CKD (chronic kidney disease) stage 3, GFR 30-59 ml/min (HCC)   Relevant Orders   BMP8+EGFR     Other   SOB (shortness of breath) - Primary      Swelling and weight is down, patient is currently doing Lasix 60 and 40, will check metabolic panel on Monday keep close eye on it.  He says his breathing is doing better and he is currently 92% on 4 L nasal cannula, discussed turning it down if he is over 90%, as long as he stays above 88 or 87 he is probably fine. Follow up plan: Return in about 1 month (around 04/25/2018), or if symptoms worsen or fail to improve.  Counseling provided for all of the vaccine components Orders Placed This Encounter  Procedures  . BMP8+EGFR    Caryl Pina, MD [facility] 03/28/2018, 3:06 PM

## 2018-04-02 ENCOUNTER — Other Ambulatory Visit: Payer: Medicare Other

## 2018-04-02 DIAGNOSIS — I50812 Chronic right heart failure: Secondary | ICD-10-CM | POA: Diagnosis not present

## 2018-04-02 DIAGNOSIS — N183 Chronic kidney disease, stage 3 unspecified: Secondary | ICD-10-CM

## 2018-04-03 LAB — BMP8+EGFR
BUN/Creatinine Ratio: 20 (ref 10–24)
BUN: 33 mg/dL — AB (ref 8–27)
CO2: 25 mmol/L (ref 20–29)
CREATININE: 1.64 mg/dL — AB (ref 0.76–1.27)
Calcium: 9.3 mg/dL (ref 8.6–10.2)
Chloride: 98 mmol/L (ref 96–106)
GFR calc Af Amer: 49 mL/min/{1.73_m2} — ABNORMAL LOW (ref >59)
GFR calc non Af Amer: 43 mL/min/{1.73_m2} — ABNORMAL LOW (ref >59)
GLUCOSE: 86 mg/dL (ref 65–99)
Potassium: 4.6 mmol/L (ref 3.5–5.2)
Sodium: 143 mmol/L (ref 134–144)

## 2018-04-04 ENCOUNTER — Other Ambulatory Visit: Payer: Self-pay | Admitting: *Deleted

## 2018-04-04 DIAGNOSIS — N183 Chronic kidney disease, stage 3 unspecified: Secondary | ICD-10-CM

## 2018-04-06 ENCOUNTER — Other Ambulatory Visit: Payer: Self-pay | Admitting: Family Medicine

## 2018-04-06 DIAGNOSIS — J441 Chronic obstructive pulmonary disease with (acute) exacerbation: Secondary | ICD-10-CM

## 2018-04-06 DIAGNOSIS — I5032 Chronic diastolic (congestive) heart failure: Secondary | ICD-10-CM | POA: Diagnosis not present

## 2018-04-10 ENCOUNTER — Ambulatory Visit (INDEPENDENT_AMBULATORY_CARE_PROVIDER_SITE_OTHER): Payer: No Typology Code available for payment source | Admitting: Internal Medicine

## 2018-04-10 ENCOUNTER — Encounter: Payer: Self-pay | Admitting: Internal Medicine

## 2018-04-10 VITALS — BP 106/70 | HR 103 | Ht 69.0 in | Wt 147.0 lb

## 2018-04-10 DIAGNOSIS — J441 Chronic obstructive pulmonary disease with (acute) exacerbation: Secondary | ICD-10-CM

## 2018-04-10 DIAGNOSIS — I2781 Cor pulmonale (chronic): Secondary | ICD-10-CM | POA: Diagnosis not present

## 2018-04-10 DIAGNOSIS — R918 Other nonspecific abnormal finding of lung field: Secondary | ICD-10-CM | POA: Diagnosis not present

## 2018-04-10 DIAGNOSIS — J9611 Chronic respiratory failure with hypoxia: Secondary | ICD-10-CM

## 2018-04-10 DIAGNOSIS — J449 Chronic obstructive pulmonary disease, unspecified: Secondary | ICD-10-CM | POA: Diagnosis not present

## 2018-04-10 MED ORDER — TIOTROPIUM BROMIDE MONOHYDRATE 2.5 MCG/ACT IN AERS: 2.0000 | INHALATION_SPRAY | Freq: Every day | RESPIRATORY_TRACT | 0 refills | Status: DC

## 2018-04-10 MED ORDER — TIOTROPIUM BROMIDE MONOHYDRATE 2.5 MCG/ACT IN AERS: 2.0000 | INHALATION_SPRAY | Freq: Every day | RESPIRATORY_TRACT | 11 refills | Status: DC

## 2018-04-10 NOTE — Patient Instructions (Signed)
Prednisone 20 mg daily until breathing better, then 10 mg daily x 5 days and stop   Plan A = Automatic = symbicort 160 2 pfffs and spiriva 2 pffs each am and symbicort 160 mg 2 pffs each pm   Work on inhaler technique:  relax and gently blow all the way out then take a nice smooth deep breath back in, triggering the inhaler at same time you start breathing in.  Hold for up to 5 seconds if you can. Blow out thru nose. Rinse and gargle with water when done   Plan B = Backup Only use your albuterol as a rescue medication to be used if you can't catch your breath by resting or doing a relaxed purse lip breathing pattern.  - The less you use it, the better it will work when you need it. - Ok to use the inhaler up to 2 puffs  every 4 hours if you must but call for appointment if use goes up over your usual need - Don't leave home without it !!  (think of it like the spare tire for your car)    Please schedule a follow up office visit in 4 weeks, sooner if needed

## 2018-04-10 NOTE — Progress Notes (Signed)
Per Dr. Sherene Sires unable to complete patients PFT due to low o2 saturation.

## 2018-04-10 NOTE — Progress Notes (Signed)
Subjective:     Patient ID: Zachary [name], male   DOB: 1951-03-07,    MRN: [id number]    Brief patient profile:  31 yobm quit smoking 02/2018 exposed to asbestos in 80's working inside [employer] at [employer] with ? Dx of asbestos by Dr Valorie Roosevelt with new R ant cp since May 2017 with dx of pna but still coughing with persistent changes on serial CT so referred to pulmonary clinic 11/28/2016 by Dr   Valorie Roosevelt   History of Present Illness  11/28/2016 1st [facility] office visit/ Zachary [name]   Chief Complaint  Patient presents with  . Pulmonary Consult    Referred by Dr. Maree Erie. He states he had PNA in May 2017 and he has had SOB and cough since then. His cough is prod with white, thick, foamy sputum. He has also had some wt loss unintentionally.   Pt very difficult historian as focuses on his imaging, not his symptoms, but records indicate an acute illness early May 2017 eval by [facility] 04/29/16 with bad sweats, cough productive of yellow mucus rx x 7 days levaquin and then later developed ant R CP but eventually this resolved but cough did not/ TB skins test neg per pt  Cough is worse but not more productive in am's and is clear mucus maybe a couple of tbsp 5-6 x per day vs 10/hour and much greater volume before abx Last abx of September Last cxr in Nov 2017  On BREO can't tell it helps  rec Try off BREO The key is to stop smoking completely before smoking completely stops you!  Please remember to go to the lab and x-ray department downstairs for your tests - we will call you with the results when they are available.     Come to outpatient registration at [facility] (behind the ER) at 715 am Wednesday  12/07/16 with nothing to eat or drink after midnight Tuesday for Bronchoscopy > nl airway / bal/tbbx some atypical cells > referred to Dr Dorris Fetch who rec serial f/u (under his watch)    Admit date: 02/28/2018 Discharge date: 03/06/2018  Primary Care Physician:   Dettinger, Elige Radon, MD   Equipment/Devices: Likely will need home O2, home O2 evaluation pending  Discharge Condition: stable  CODE STATUS:  DNR   Diet recommendation: Heart healthy diet   Discharge Diagnoses:   Acute systolic CHF with right heart failure Pulmonary hypertension with cor pulmonale = WHO group III Acute respiratory failure with hypoxia . Essential hypertension, benign . Emphysema of lung (HCC) Mild acute on chronic kidney disease stage III  Consults:  Cardiology/CHF team   Pulmonology    DISCHARGE MEDICATIONS:     Allergies as of 03/06/2018      Reactions   Aspirin Other (See Comments)   Stomach burning               Medication List     STOP taking these medications   ALEVE PM 220-25 MG Tabs Generic drug:  Naproxen Sod-diphenhydrAMINE   hydrochlorothiazide 12.5 MG capsule Commonly known as:  MICROZIDE   predniSONE 20 MG tablet Commonly known as:  DELTASONE     TAKE these medications   albuterol 108 (90 Base) MCG/ACT inhaler Commonly known as:  PROVENTIL HFA;VENTOLIN HFA Inhale 2 puffs into the lungs every 6 (six) hours as needed for wheezing or shortness of breath.   aspirin 81 MG chewable tablet Chew 1 tablet (81 mg total) by mouth daily.  atorvastatin 10 MG tablet Commonly known as:  LIPITOR Take 1 tablet (10 mg total) by mouth at bedtime.   bisoprolol 5 MG tablet Commonly known as:  ZEBETA Take 0.5 tablets (2.5 mg total) by mouth daily. Start taking on:  03/07/2018   budesonide-formoterol 160-4.5 MCG/ACT inhaler Commonly known as:  SYMBICORT Inhale 2 puffs into the lungs 2 (two) times daily.   furosemide 20 MG tablet Commonly known as:  LASIX Take 1 tablet (20 mg total) by mouth daily.   hydroxypropyl methylcellulose / hypromellose 2.5 % ophthalmic solution Commonly known as:  ISOPTO TEARS / GONIOVISC Place 1 drop into both eyes as needed for dry eyes.   losartan 25 MG tablet Commonly known as:   COZAAR Take 1 tablet (25 mg total) by mouth daily.   omeprazole 40 MG capsule Commonly known as:  PRILOSEC TAKE 1 CAPSULE (40 MG TOTAL) BY MOUTH DAILY.        Brief H and P: For complete details please refer to admission H and P, but in brief 67 year old male with a history of COPD, hypertension, GERD presenting with one month history of shortness of breath and worsening lower extremity edema. 2 weeks ago, the patient was seen by his PCP and started on Symbicort, albuterol, prednisone, and azithromycin. He was noted to have oxygen saturation of 87% on RA in the office. He followed up in the office on 02/28/2018. His symptoms did not improve and was tachycardic with oxygen sats in the 70s. Patient was recommended to go to the ED. Complaining of orthopnea, dyspnea on exertion for the last 2-3 weeks. Smoked heavily, quit 2 years ago with 100-pack-year history of tobacco. Patient was transferred to [facility] due to CHF, started on IV furosemide.  Hospital Course:  Acute systolic CHF (congestive heart failure) (HCC)with right heart failure, pulmonary hypertension -Patient was placed on IV Lasix for diuresis and had significant improvement with symptoms and peripheral edema.   -2D echo on 02/28/18 showed EF of 40-45% with moderate to severe TR, PA pressure 84, moderate reduction in the RV function -Negative balance of8.0L, weight down from 151lbs on admission->132 lbs at discharge -Patient underwent right and left heart catheterization which showed normal right and left heart filling pressures, moderate pulmonary arterial hypertension likely group 3 PAH related to severe COPD, nonobstructive coronary artery disease. -Home O2 evaluation atdischarge -Patient was seen by CHF team, recommended aspirin 81, Lipitor 10 mg daily, bisoprolol 2.5 mg daily, losartan 25 mg daily, Lasix 20 mg daily at discharge   Acute respiratory failure with hypoxia likely due to acute systolic  CHF, cor pulmonale and underlying COPD, severe pulmonary hypertension -CT angiogram of the chest negative for PE, right-sided CHF with right upper lung bronchiectasis -Continue O2, scheduled nebs, Dulera, -VQ scan was negative for chronic pulmonary embolism, showed overall pattern of ventilation perfusion most consistent with COPD and right upper lobe scarring/volume loss.  Essential hypertension, benign -Currently stable, continue hydralazine, Lasix  Mild acute on CKD (chronic kidney disease) stage 3, GFR 30-59 ml/min (HCC) -Baseline creatinine 1.1-1.3 -Creatinine stable 1.2 at discharge, continueLasix   Day of Discharge S: Feeling better, eager to go home  BP (!) 83/63   Pulse (!) 117   Temp 98.7 F (37.1 C) (Oral)   Resp 17   Ht 5\' 9"  (1.753 m)   Wt 60 kg (132 lb 3.2 oz)   SpO2 92%   BMI 19.52 kg/m   Physical Exam: General: Alert and awake oriented x3 not in  any acute distress. HEENT: anicteric sclera, pupils reactive to light and accommodation CVS: S1-S2 clear no murmur rubs or gallops Chest: Diminished breath sounds Abdomen: soft nontender, nondistended, normal bowel sounds Extremities: no cyanosis, clubbing or edema noted bilaterally Neuro: Cranial nerves II-XII intact, no focal neurological deficits   The results of significant diagnostics from this hospitalization (including imaging, microbiology, ancillary and laboratory) are listed below for reference.      Procedures/Studies:  Study Conclusions ECHO  - Left ventricle: The cavity size was normal. There was mild focal basal hypertrophy of the septum. Systolic function was mildly to moderately reduced. The estimated ejection fraction was in the range of 40% to 45%. LVEF and wall motion difficult to assess in setting of tachycardia and abnormal septal motion. Possible hypokinesis of the inferior and apical myocardium. The study is not technically sufficient to allow evaluation of  LV diastolic function. - Ventricular septum: The contour showed diastolic flattening and systolic flattening consistent with right ventricular volume and pressure overload.  RIGHT/LEFT HEART CATH AND CORONARY ANGIOGRAPHY  Conclusion   1. Low right and left heart filling pressures.  2. Moderate pulmonary arterial hypertension, likely group 3 PH related to severe COPD.  3. Nonobstructive coronary disease.   Transition to po Lasix.   Would not treat with pulmonary vasodilators.      03/21/2018  Acute extended f/u ov/Zachary [name] re: worse sob/ cough / new resp failure/ cor pulmonale  Chief Complaint  Patient presents with  . Acute Visit    Pt c/o increased dyspnea and cough since Jan 2019.  He states he is coughing all the time, occ prod with white, foamy sputum. He states that he is SOB with minimal exertion such as just walking from room to room at home. He has been swelling is his legs.   Dyspnea:  Worse x one year/ quit smoking x one month prior to OV   Cough: x one year more day > noct  With cough syncope Sleep: ok flat  SABA use:  Helps some MMRC4  = sob if tries to leave home or while getting dressed   rec See Dr Dettinger to consider Starting  on aldactone next visit but it will require careful monitoring of your potassium and kidney function  Plan A = Automatic = symbicort 160 Take 2 puffs first thing in am and then another 2 puffs about 12 hours later.  Work on inhaler technique:   Plan B = Backup Only use your albuterol as a rescue medication Keep 02 at 4 lpm 24/7  Please schedule a follow up office visit in 2 weeks, sooner if needed  with all medications /inhalers/ solutions in hand so we can verify exactly what you are taking. This includes all medications from all doctors and over the counters       04/10/2018  f/u ov/Zachary [name] re:  Chief Complaint  Patient presents with  . Follow-up    Breathing is unchanged. He is coughing more- prod with white, bubbly sputum.      Dyspnea:  50 ft on 4lpm  Cough: worse p supper but not hs  Sleep: 4lpm 1 pillow  SABA use:  None    No obvious day to day or daytime variability or assoc excess/ purulent sputum or mucus plugs or hemoptysis or cp or chest tightness, subjective wheeze or overt sinus or hb symptoms. No unusual exposure hx or h/o childhood pna/ asthma or knowledge of premature birth.  Sleeping  On on 4lpm and one pillow  without nocturnal  or early am exacerbation  of respiratory  c/o's or need for noct saba. Also denies any obvious fluctuation of symptoms with weather or environmental changes or other aggravating or alleviating factors except as outlined above   Current Allergies, Complete Past Medical History, Past Surgical History, Family History, and Social History were reviewed in [employer] record.  ROS  The following are not active complaints unless bolded Hoarseness, sore throat, dysphagia, dental problems, itching, sneezing,  nasal congestion or discharge of excess mucus or purulent secretions, ear ache,   fever, chills, sweats, unintended wt loss or wt gain, classically pleuritic or exertional cp,  orthopnea pnd or arm/hand swelling  or leg swelling, presyncope, palpitations, abdominal pain, anorexia, nausea, vomiting, diarrhea  or change in bowel habits or change in bladder habits, change in stools or change in urine, dysuria, hematuria,  rash, arthralgias, visual complaints, headache, numbness, weakness or ataxia or problems with walking or coordination,  change in mood or  memory.        Current Meds  Medication Sig  . albuterol (PROVENTIL HFA;VENTOLIN HFA) 108 (90 Base) MCG/ACT inhaler Inhale 2 puffs into the lungs every 6 (six) hours as needed for wheezing or shortness of breath.  Marland Kitchen aspirin 81 MG chewable tablet Chew 1 tablet (81 mg total) by mouth daily.  Marland Kitchen atorvastatin (LIPITOR) 10 MG tablet Take 1 tablet (10 mg total) by mouth at bedtime.  . bisoprolol (ZEBETA) 5 MG  tablet Take 2.5 mg by mouth daily.  . budesonide-formoterol (SYMBICORT) 160-4.5 MCG/ACT inhaler Inhale 2 puffs into the lungs 2 (two) times daily.  . furosemide (LASIX) 40 MG tablet Take 60 mg (1.5 Tablet) in the AM and 40 mg (1 Tablet) in the PM.  . losartan (COZAAR) 25 MG tablet Take 25 mg by mouth at bedtime.  Marland Kitchen omeprazole (PRILOSEC) 40 MG capsule TAKE 1 CAPSULE (40 MG TOTAL) BY MOUTH DAILY.  Marland Kitchen OXYGEN 4lpm 24/7  [employer]  . predniSONE (DELTASONE) 20 MG tablet Take 20 mg by mouth daily as needed.  Marland Kitchen spironolactone (ALDACTONE) 25 MG tablet Take 0.5 tablets (12.5 mg total) by mouth daily.                             Objective:   Physical Exam   amb bm nad    03/21/2018         150   11/28/16 156 lb 12.8 oz (71.1 kg)  06/22/16 144 lb 9.6 oz (65.6 kg)  05/21/16 141 lb 6.4 oz (64.1 kg)    Vital signs reviewed - Note on arrival 02 sats  88% on 6lpm but improved to 91% at rest on 4lpm prior to leaving       HEENT: nl dentition / oropharynx. Nl external ear canals without cough reflex - moderate bilateral non-specific turbinate edema     NECK :  without JVD/Nodes/TM/ nl carotid upstrokes bilaterally   LUNGS: no acc muscle use,  Mod barrel  contour chest wall with bilateral  Distant bs s audible exp wheeze but a few insp  crackles in both bases  and  without cough on insp or exp maneuver and mod   Hyperresonant  to  percussion bilaterally     CV:  RRR  no s3 or murmur or increase in P2, and no significant  edema   ABD:  soft and nontender with pos mid insp Hoover's  in the supine position. No bruits  or organomegaly appreciated, bowel sounds nl  MS:   Nl gait/  ext warm without deformities, calf tenderness, cyanosis - mild clubbing No obvious joint restrictions   SKIN: warm and dry without lesions    NEURO:  alert, approp, nl sensorium with  no motor or cerebellar deficits apparent.                   Assessment:

## 2018-04-11 ENCOUNTER — Encounter: Payer: Self-pay | Admitting: Internal Medicine

## 2018-04-11 NOTE — Assessment & Plan Note (Signed)
Spirometry 11/28/2016  FEV1 2.29 (75%)  Ratio 55  On BREO> no change symptoms 01/03/2017 off bre - quit smoking 02/2018 with onset of resp failure/ cor pulmonale - 03/21/2018  After extensive coaching inhaler device  effectiveness =    75% from a basline of 25% > try add spiriva   Unfortunately he resumed smoking between the 11/28/16 study showing gold II with very low dlco and 02/16/18 when he was forced to quit by his declining health and is approaching endstage related to his oxygenation and might be considered for lung transplant in the near future vs palliative care. Will first try adding spiriva- see avs for instructions unique to this ov     In meantime seeing Dr Shirlee Latch for cor pulmonale and appears better compensated in that regard since starting aldactone  (see separate a/p)    Each maintenance medication was reviewed in detail including most importantly the difference between maintenance and as needed and under what circumstances the prns are to be used.  Please see AVS for specific  Instructions which are unique to this visit and I personally typed out  which were reviewed in detail in writing with the patient and a copy provided.

## 2018-04-11 NOTE — Assessment & Plan Note (Addendum)
RHC/ LHC 02/28/18 1. Low right and left heart filling pressures.  2. Moderate pulmonary arterial hypertension, likely group 3 PH related to severe COPD.  3. Nonobstructive coronary disease.    Agree this is likely WHO 3 and would not use vasodilators here which might make 02 issues worse   ? Needs bubble study to eval for R to L shunt > defer to Dr Shirlee Latch

## 2018-04-11 NOTE — Assessment & Plan Note (Signed)
Onset clinically early May 2017 with severe sweats/ copious sputum and transient pleuritic cp better p levaquin - CT chest 07/12/16 Pos air bronchograms RUL dense infiltrate  new from 08/28/14  - Ct  Chest 0?25/17 No significant change  -  CXR 11/28/2016 persistent AS dz R apex with esr = 47 - FOB 12/07/2016  MP secretions from RUL > bal/tbbx >>> cyt neg/ bx focal atypia/ neg AFB culture  - PET  01/19/17 >   1. Extensive right upper lobe pleural thickening and adjacent severe lung disease. The pleural disease is hypermetabolic. This could reflect tumor or severe chronic inflammation.   2. Mildly hypermetabolic right-sided mediastinal lymph nodes more likely reactive/inflammatory than metastatic disease/neoplasm. 3. Severe emphysema and pulmonary scarring changes. 4. No significant findings in abdomen/pelvis > refer to T surgery and seen 01/30/17  > turned  Down for surgery based on dlco 20%  - CT bx 02/06/17 > atypical cells only > rec f/u by Dr Roxan Hockey  - CT 03/01/18 > Stable bandlike subpleural consolidation, volume loss, distortion and bronchiectasis in the peripheral right upper lobe. Morphologically, this finding is most compatible with pleural-parenchymal scarring such as due to chronic granulomatous infection. Continued chest CT surveillance warranted in 6-12 months.  Most likely this is bronchiectasis/ scarrring and would not explain severe decrease in dlco previously noted on ongoing issues with hypoxemia/ cor pulmonale related to the dlco

## 2018-04-11 NOTE — Assessment & Plan Note (Signed)
Started on 02 at d/c 03/06/18 at 4lpm 24/7    04/10/2018 rec 4lpm at rest and 6lpm walking

## 2018-04-25 ENCOUNTER — Ambulatory Visit (HOSPITAL_COMMUNITY)
Admission: RE | Admit: 2018-04-25 | Discharge: 2018-04-25 | Disposition: A | Discharge status: Home / Self Care | Payer: Medicare Other | Source: Ambulatory Visit | Attending: Cardiology | Admitting: Cardiology

## 2018-04-25 ENCOUNTER — Encounter (HOSPITAL_COMMUNITY): Payer: Self-pay

## 2018-04-25 VITALS — BP 102/80 | HR 106 | Wt 147.7 lb

## 2018-04-25 DIAGNOSIS — I5022 Chronic systolic (congestive) heart failure: Secondary | ICD-10-CM | POA: Diagnosis not present

## 2018-04-25 DIAGNOSIS — J449 Chronic obstructive pulmonary disease, unspecified: Secondary | ICD-10-CM | POA: Insufficient documentation

## 2018-04-25 DIAGNOSIS — Z7951 Long term (current) use of inhaled steroids: Secondary | ICD-10-CM | POA: Insufficient documentation

## 2018-04-25 DIAGNOSIS — Z7952 Long term (current) use of systemic steroids: Secondary | ICD-10-CM | POA: Diagnosis not present

## 2018-04-25 DIAGNOSIS — N183 Chronic kidney disease, stage 3 unspecified: Secondary | ICD-10-CM

## 2018-04-25 DIAGNOSIS — Z9981 Dependence on supplemental oxygen: Secondary | ICD-10-CM | POA: Diagnosis not present

## 2018-04-25 DIAGNOSIS — I50812 Chronic right heart failure: Secondary | ICD-10-CM | POA: Diagnosis not present

## 2018-04-25 DIAGNOSIS — I272 Pulmonary hypertension, unspecified: Secondary | ICD-10-CM

## 2018-04-25 DIAGNOSIS — I4510 Unspecified right bundle-branch block: Secondary | ICD-10-CM | POA: Diagnosis not present

## 2018-04-25 DIAGNOSIS — Z87891 Personal history of nicotine dependence: Secondary | ICD-10-CM | POA: Insufficient documentation

## 2018-04-25 DIAGNOSIS — I2510 Atherosclerotic heart disease of native coronary artery without angina pectoris: Secondary | ICD-10-CM | POA: Insufficient documentation

## 2018-04-25 DIAGNOSIS — R918 Other nonspecific abnormal finding of lung field: Secondary | ICD-10-CM | POA: Insufficient documentation

## 2018-04-25 DIAGNOSIS — I130 Hypertensive heart and chronic kidney disease with heart failure and stage 1 through stage 4 chronic kidney disease, or unspecified chronic kidney disease: Secondary | ICD-10-CM | POA: Insufficient documentation

## 2018-04-25 DIAGNOSIS — I428 Other cardiomyopathies: Secondary | ICD-10-CM | POA: Insufficient documentation

## 2018-04-25 DIAGNOSIS — K219 Gastro-esophageal reflux disease without esophagitis: Secondary | ICD-10-CM | POA: Diagnosis not present

## 2018-04-25 DIAGNOSIS — Z79899 Other long term (current) drug therapy: Secondary | ICD-10-CM | POA: Diagnosis not present

## 2018-04-25 DIAGNOSIS — Z7982 Long term (current) use of aspirin: Secondary | ICD-10-CM | POA: Insufficient documentation

## 2018-04-25 LAB — BASIC METABOLIC PANEL
ANION GAP: 9 (ref 5–15)
BUN: 27 mg/dL — AB (ref 6–20)
CHLORIDE: 104 mmol/L (ref 101–111)
CO2: 27 mmol/L (ref 22–32)
Calcium: 9.2 mg/dL (ref 8.9–10.3)
Creatinine, Ser: 1.37 mg/dL — ABNORMAL HIGH (ref 0.61–1.24)
GFR calc Af Amer: 60 mL/min — ABNORMAL LOW (ref >60)
GFR, EST NON AFRICAN AMERICAN: 51 mL/min — AB (ref >60)
Glucose, Bld: 114 mg/dL — ABNORMAL HIGH (ref 65–99)
Potassium: 4.6 mmol/L (ref 3.5–5.1)
SODIUM: 140 mmol/L (ref 135–145)

## 2018-04-25 NOTE — Progress Notes (Signed)
PCP: Dr Dettinger  Primary Cardiologist: Dr Shirlee Latch   HPI: Zachary Scales [place] a 67 y.o.malewith a history of COPD, HTN, and chronic RUL infiltrate.  Admitted 3/13 through 03/06/2018. Echo in 3/19 showed EF 40-45% with moderately decreased RV systolic function.  Admitted with increased dyspnea. Diuresed with IV lasix and transitioned to 20 mg lasix daily. Had RHC with low filling pressures and moderate PAH related to severe COPD. LHC showed nonobstructive CAD.  He was discharged with home oxygen and plans for outpatient sleep study. Discharge weight 132 pounds.   Today he returns for HF follow up with his wife. Last visit, lasix was increased and spiro was started. He took the higher dose of lasix x1 week, but then went back to lasix 40 mg daily because he was worried about his kidneys. His weight is down 2 lbs. His SOB is about the same. He gets SOB with exertion. He is mainly limited by fatigue. He becomes fatigued with ADLs. At times, he get dizzy with exertion or with coughing. Cough is nonproductive. No dizziness with sitting to standing. No CP, orthopnea, PND, or edema. He is wearing 3-4 L O2 at rest and 6 L on exertion. Weights 148-149 lbs at home. Compliant with medications. Wife is limiting his salt and fluid intake.   Labs (4/19): K 4.4, creatinine 1.52, BNP 524 Labs (04/02/18): K 4.6, creatinine 1.64  PMH: 1. COPD: Prior smoker.  On home oxygen.  - PFTs (2/18) with moderate obstructive airways disease.  - CT chest 3/19 with emphysema.  2. Chronic RUL consolidation: Biopsy did not show malignancy (Dr. Dorris Fetch).  3. GERD 4. HTN 5. Asbestos exposure.  6. Pulmonary hypertension: Suspect group 3 due to severe COPD with hypoxemia.   - RHC (3/19) with mean RA 3, PA 52/10 mean 32, mean PCWP 5, CI 2.3, PVR 6.7 WU.  - CTA chest (3/19) with no PE.  - V/Q scan 3/19 with no evidence for chronic PE.  7. Chronic systolic CHF with prominent RV failure: Nonischemic cardiomyopathy.  - LHC  (3/19) with nonobstructive CAD.  - Echo (3/19) with mild focal basal septal hypertrophy, EF 40-45%, D-shaped interventricular septum, moderate-severe TR, severely dilated RV with moderately decreased systolic function.  8. CKD: Stage 3.   ROS: All systems negative except as listed in HPI, PMH and Problem List.  Social History   Socioeconomic History  . Marital status: Married    Spouse name: Not on file  . Number of children: Not on file  . Years of education: Not on file  . Highest education level: Not on file  Occupational History  . Not on file  Social Needs  . Financial resource strain: Not on file  . Food insecurity:    Worry: Not on file    Inability: Not on file  . Transportation needs:    Medical: Not on file    Non-medical: Not on file  Tobacco Use  . Smoking status: Former Smoker    Packs/day: 1.00    Years: 40.00    Pack years: 40.00    Types: Cigarettes    Start date: 01/27/1974    Last attempt to quit: 02/16/2018    Years since quitting: 0.1  . Smokeless tobacco: Never Used  Substance and Sexual Activity  . Alcohol use: No  . Drug use: No  . Sexual activity: Not on file  Lifestyle  . Physical activity:    Days per week: Not on file    Minutes per session: Not  on file  . Stress: Not on file  Relationships  . Social connections:    Talks on phone: Not on file    Gets together: Not on file    Attends religious service: Not on file    Active member of club or organization: Not on file    Attends meetings of clubs or organizations: Not on file    Relationship status: Not on file  . Intimate partner violence:    Fear of current or ex partner: Not on file    Emotionally abused: Not on file    Physically abused: Not on file    Forced sexual activity: Not on file  Other Topics Concern  . Not on file  Social History Narrative  . Not on file    Family History  Problem Relation Age of Onset  . Heart disease Father     Current Outpatient Medications    Medication Sig Dispense Refill  . albuterol (PROVENTIL HFA;VENTOLIN HFA) 108 (90 Base) MCG/ACT inhaler Inhale 2 puffs into the lungs every 6 (six) hours as needed for wheezing or shortness of breath. 1 Inhaler 0  . aspirin 81 MG chewable tablet Chew 1 tablet (81 mg total) by mouth daily. 30 tablet 3  . atorvastatin (LIPITOR) 10 MG tablet Take 1 tablet (10 mg total) by mouth at bedtime. 30 tablet 3  . bisoprolol (ZEBETA) 5 MG tablet Take 2.5 mg by mouth daily.    . budesonide-formoterol (SYMBICORT) 160-4.5 MCG/ACT inhaler Inhale 2 puffs into the lungs 2 (two) times daily. 1 Inhaler 0  . furosemide (LASIX) 40 MG tablet Take 40 mg by mouth.    . losartan (COZAAR) 25 MG tablet Take 25 mg by mouth at bedtime.    Marland Kitchen omeprazole (PRILOSEC) 40 MG capsule TAKE 1 CAPSULE (40 MG TOTAL) BY MOUTH DAILY. 90 capsule 1  . OXYGEN 4lpm 24/7  [facility]    . predniSONE (DELTASONE) 20 MG tablet Take 20 mg by mouth daily as needed.    Marland Kitchen spironolactone (ALDACTONE) 25 MG tablet Take 0.5 tablets (12.5 mg total) by mouth daily. 45 tablet 3  . Tiotropium Bromide Monohydrate (SPIRIVA RESPIMAT) 2.5 MCG/ACT AERS Inhale 2 puffs into the lungs daily. 1 Inhaler 11   No current facility-administered medications for this encounter.     Vitals:   04/25/18 1503  BP: 102/80  Pulse: (!) 106  SpO2: (!) 86%  Weight: 147 lb 11.2 oz (67 kg)    Filed Weights   04/25/18 1503  Weight: 147 lb 11.2 oz (67 kg)     PHYSICAL EXAM: General: Chronically ill appearing. HEENT: Normal Neck: Supple. JVP 7-8. Carotids 2+ bilat; no bruits. No thyromegaly or nodule noted. Cor: PMI nondisplaced. RRR, No M/G/R noted Lungs: CTAB, normal effort. Abdomen: Soft, non-tender, non-distended, no HSM. No bruits or masses. +BS  Extremities: No cyanosis, clubbing, or rash. R and LLE trace ankle edema Neuro: Alert & orientedx3, cranial nerves grossly intact. moves all 4 extremities w/o difficulty. Affect pleasant  EKG: NSR 93 with RA enlargement.  Looks similar to previous. Personally reviewed.   ASSESSMENT & PLAN:  1. Chronic Systolic Heart Failure: With prominent RV dysfunction. Nonischemic cardiomyopathy.  Echo 02/28/2018 with EF 40-45%, severe dilation of RV with moderate systolic dysfunction, severe dilation of right atrium, mod-severe TR, PA peak pressure 84.Low filling pressures with moderate PAH on RHC 3/18. LHC showed nonobstructive CAD.   NYHA class IIIb symptoms. Volume stable on exam.  - Continue lasix 40 mg daily. BMET today.  -  Continue bisoprolol 2.5 mg daily  - Continue losartan 25 mg daily.   - Continue spironolactone 12.5 mg daily. Hold off on increasing with K 4.6 last BMET and soft BPs 2. COPD: Former heavy smoker. Moderate-severe emphysema on CT chest.  - Follows with Dr Sherene Sires.  - On home oxygen.  3. CKD stage 3: BMET today 4. Pulmonary HTN: Suspect primarily group 3 from severe COPD with hypoxemia.  V/Q scan was not suggestive of chronic PE.  - Needs sleep study.   5. CAD: Nonobstructive on LHC - Continue aspirin and statin.  - No s/s ischemia 6. RUL consolidation: Chronic, followed by pulmonary.   BMET today Follow up with Dr Shirlee Latch in 6-8 weeks  Alford Highland 04/25/2018

## 2018-04-25 NOTE — Patient Instructions (Signed)
Routine lab work today. Will notify you of abnormal results, otherwise no news is good news!  EKG today.  Follow up 6-8 weeks.  ____________________________________________________________ Zachary [name] Code:    Take all medication as prescribed the day of your appointment. Bring all medications with you to your appointment.  Do the following things EVERYDAY: 1) Weigh yourself in the morning before breakfast. Write it down and keep it in a log. 2) Take your medicines as prescribed 3) Eat low salt foods-Limit salt (sodium) to 2000 mg per day.  4) Stay as active as you can everyday 5) Limit all fluids for the day to less than 2 liters

## 2018-05-09 ENCOUNTER — Ambulatory Visit (INDEPENDENT_AMBULATORY_CARE_PROVIDER_SITE_OTHER): Payer: No Typology Code available for payment source | Admitting: Internal Medicine

## 2018-05-09 ENCOUNTER — Encounter: Payer: Self-pay | Admitting: Internal Medicine

## 2018-05-09 VITALS — BP 118/86 | HR 115 | Ht 70.5 in | Wt 153.4 lb

## 2018-05-09 DIAGNOSIS — J449 Chronic obstructive pulmonary disease, unspecified: Secondary | ICD-10-CM | POA: Diagnosis not present

## 2018-05-09 DIAGNOSIS — J9611 Chronic respiratory failure with hypoxia: Secondary | ICD-10-CM

## 2018-05-09 DIAGNOSIS — I2781 Cor pulmonale (chronic): Secondary | ICD-10-CM | POA: Diagnosis not present

## 2018-05-09 MED ORDER — TIOTROPIUM BROMIDE MONOHYDRATE 2.5 MCG/ACT IN AERS: 1.0000 | INHALATION_SPRAY | Freq: Every day | RESPIRATORY_TRACT | 0 refills | Status: DC

## 2018-05-09 MED ORDER — TIOTROPIUM BROMIDE MONOHYDRATE 2.5 MCG/ACT IN AERS: 2.0000 | INHALATION_SPRAY | Freq: Every day | RESPIRATORY_TRACT | 11 refills | Status: AC

## 2018-05-09 NOTE — Progress Notes (Signed)
Subjective:     Patient ID: Zachary [name], male   DOB: March 15, 1951,    MRN: [id number]    Brief patient profile:  48 yobm quit smoking 02/2018 exposed to asbestos in 80's working inside [employer] at [employer] with ? Dx of asbestos by Dr Valorie Roosevelt with new R ant cp since May 2017 with dx of pna but still coughing with persistent changes on serial CT so referred to pulmonary clinic 11/28/2016 by Dr   Valorie Roosevelt   History of Present Illness  11/28/2016 1st [facility] office visit/ Zachary [name]   Chief Complaint  Patient presents with  . Pulmonary Consult    Referred by Dr. Maree Erie. He states he had PNA in May 2017 and he has had SOB and cough since then. His cough is prod with white, thick, foamy sputum. He has also had some wt loss unintentionally.   Pt very difficult historian as focuses on his imaging, not his symptoms, but records indicate an acute illness early May 2017 eval by [facility] 04/29/16 with bad sweats, cough productive of yellow mucus rx x 7 days levaquin and then later developed ant R CP but eventually this resolved but cough did not/ TB skins test neg per pt  Cough is worse but not more productive in am's and is clear mucus maybe a couple of tbsp 5-6 x per day vs 10/hour and much greater volume before abx Last abx of September Last cxr in Nov 2017  On BREO can't tell it helps  rec Try off BREO The key is to stop smoking completely before smoking completely stops you!  Please remember to go to the lab and x-ray department downstairs for your tests - we will call you with the results when they are available.     Come to outpatient registration at [facility] (behind the ER) at 715 am Wednesday  12/07/16 with nothing to eat or drink after midnight Tuesday for Bronchoscopy > nl airway / bal/tbbx some atypical cells > referred to Dr Dorris Fetch who rec serial f/u (under his watch)    Admit date: 02/28/2018 Discharge date: 03/06/2018  Primary Care Physician:   Dettinger, Elige Radon, MD   Equipment/Devices: Likely will need home O2, home O2 evaluation pending  Discharge Condition: stable  CODE STATUS:  DNR   Diet recommendation: Heart healthy diet   Discharge Diagnoses:   Acute systolic CHF with right heart failure Pulmonary hypertension with cor pulmonale = WHO group III Acute respiratory failure with hypoxia . Essential hypertension, benign . Emphysema of lung (HCC) Mild acute on chronic kidney disease stage III  Consults:  Cardiology/CHF team   Pulmonology    DISCHARGE MEDICATIONS:     Allergies as of 03/06/2018      Reactions   Aspirin Other (See Comments)   Stomach burning               Medication List     STOP taking these medications   ALEVE PM 220-25 MG Tabs Generic drug:  Naproxen Sod-diphenhydrAMINE   hydrochlorothiazide 12.5 MG capsule Commonly known as:  MICROZIDE   predniSONE 20 MG tablet Commonly known as:  DELTASONE     TAKE these medications   albuterol 108 (90 Base) MCG/ACT inhaler Commonly known as:  PROVENTIL HFA;VENTOLIN HFA Inhale 2 puffs into the lungs every 6 (six) hours as needed for wheezing or shortness of breath.   aspirin 81 MG chewable tablet Chew 1 tablet (81 mg total) by mouth daily.  atorvastatin 10 MG tablet Commonly known as:  LIPITOR Take 1 tablet (10 mg total) by mouth at bedtime.   bisoprolol 5 MG tablet Commonly known as:  ZEBETA Take 0.5 tablets (2.5 mg total) by mouth daily. Start taking on:  03/07/2018   budesonide-formoterol 160-4.5 MCG/ACT inhaler Commonly known as:  SYMBICORT Inhale 2 puffs into the lungs 2 (two) times daily.   furosemide 20 MG tablet Commonly known as:  LASIX Take 1 tablet (20 mg total) by mouth daily.   hydroxypropyl methylcellulose / hypromellose 2.5 % ophthalmic solution Commonly known as:  ISOPTO TEARS / GONIOVISC Place 1 drop into both eyes as needed for dry eyes.   losartan 25 MG tablet Commonly known as:   COZAAR Take 1 tablet (25 mg total) by mouth daily.   omeprazole 40 MG capsule Commonly known as:  PRILOSEC TAKE 1 CAPSULE (40 MG TOTAL) BY MOUTH DAILY.        Brief H and P: For complete details please refer to admission H and P, but in brief 67 year old male with a history of COPD, hypertension, GERD presenting with one month history of shortness of breath and worsening lower extremity edema. 2 weeks ago, the patient was seen by his PCP and started on Symbicort, albuterol, prednisone, and azithromycin. He was noted to have oxygen saturation of 87% on RA in the office. He followed up in the office on 02/28/2018. His symptoms did not improve and was tachycardic with oxygen sats in the 70s. Patient was recommended to go to the ED. Complaining of orthopnea, dyspnea on exertion for the last 2-3 weeks. Smoked heavily, quit 2 years ago with 100-pack-year history of tobacco. Patient was transferred to [facility] due to CHF, started on IV furosemide.  Hospital Course:  Acute systolic CHF (congestive heart failure) (HCC)with right heart failure, pulmonary hypertension -Patient was placed on IV Lasix for diuresis and had significant improvement with symptoms and peripheral edema.   -2D echo on 02/28/18 showed EF of 40-45% with moderate to severe TR, PA pressure 84, moderate reduction in the RV function -Negative balance of8.0L, weight down from 151lbs on admission->132 lbs at discharge -Patient underwent right and left heart catheterization which showed normal right and left heart filling pressures, moderate pulmonary arterial hypertension likely group 3 PAH related to severe COPD, nonobstructive coronary artery disease. -Home O2 evaluation atdischarge -Patient was seen by CHF team, recommended aspirin 81, Lipitor 10 mg daily, bisoprolol 2.5 mg daily, losartan 25 mg daily, Lasix 20 mg daily at discharge   Acute respiratory failure with hypoxia likely due to acute systolic  CHF, cor pulmonale and underlying COPD, severe pulmonary hypertension -CT angiogram of the chest negative for PE, right-sided CHF with right upper lung bronchiectasis -Continue O2, scheduled nebs, Dulera, -VQ scan was negative for chronic pulmonary embolism, showed overall pattern of ventilation perfusion most consistent with COPD and right upper lobe scarring/volume loss.  Essential hypertension, benign -Currently stable, continue hydralazine, Lasix  Mild acute on CKD (chronic kidney disease) stage 3, GFR 30-59 ml/min (HCC) -Baseline creatinine 1.1-1.3 -Creatinine stable 1.2 at discharge, continueLasix   Day of Discharge S: Feeling better, eager to go home  BP (!) 83/63   Pulse (!) 117   Temp 98.7 F (37.1 C) (Oral)   Resp 17   Ht 5\' 9"  (1.753 m)   Wt 60 kg (132 lb 3.2 oz)   SpO2 92%   BMI 19.52 kg/m   Physical Exam: General: Alert and awake oriented x3 not in  any acute distress. HEENT: anicteric sclera, pupils reactive to light and accommodation CVS: S1-S2 clear no murmur rubs or gallops Chest: Diminished breath sounds Abdomen: soft nontender, nondistended, normal bowel sounds Extremities: no cyanosis, clubbing or edema noted bilaterally Neuro: Cranial nerves II-XII intact, no focal neurological deficits   The results of significant diagnostics from this hospitalization (including imaging, microbiology, ancillary and laboratory) are listed below for reference.      Procedures/Studies:  Study Conclusions ECHO  - Left ventricle: The cavity size was normal. There was mild focal basal hypertrophy of the septum. Systolic function was mildly to moderately reduced. The estimated ejection fraction was in the range of 40% to 45%. LVEF and wall motion difficult to assess in setting of tachycardia and abnormal septal motion. Possible hypokinesis of the inferior and apical myocardium. The study is not technically sufficient to allow evaluation of  LV diastolic function. - Ventricular septum: The contour showed diastolic flattening and systolic flattening consistent with right ventricular volume and pressure overload.  RIGHT/LEFT HEART CATH AND CORONARY ANGIOGRAPHY  Conclusion   1. Low right and left heart filling pressures.  2. Moderate pulmonary arterial hypertension, likely group 3 PH related to severe COPD.  3. Nonobstructive coronary disease.   Transition to po Lasix.   Would not treat with pulmonary vasodilators.      03/21/2018  Acute extended f/u ov/Zachary [name] re: worse sob/ cough / new resp failure/ cor pulmonale  Chief Complaint  Patient presents with  . Acute Visit    Pt c/o increased dyspnea and cough since Jan 2019.  He states he is coughing all the time, occ prod with white, foamy sputum. He states that he is SOB with minimal exertion such as just walking from room to room at home. He has been swelling is his legs.   Dyspnea:  Worse x one year/ quit smoking x one month prior to OV   Cough: x one year more day > noct  With cough syncope Sleep: ok flat  SABA use:  Helps some MMRC4  = sob if tries to leave home or while getting dressed   rec See Dr Dettinger to consider Starting  on aldactone next visit but it will require careful monitoring of your potassium and kidney function  Plan A = Automatic = symbicort 160 Take 2 puffs first thing in am and then another 2 puffs about 12 hours later.  Work on inhaler technique:   Plan B = Backup Only use your albuterol as a rescue medication Keep 02 at 4 lpm 24/7  Please schedule a follow up office visit in 2 weeks, sooner if needed  with all medications /inhalers/ solutions in hand so we can verify exactly what you are taking. This includes all medications from all doctors and over the counters       04/10/2018  f/u ov/Zachary [name] re: GOLD II criteria with severe 02 dep resp failure  Chief Complaint  Patient presents with  . Follow-up    Breathing is unchanged. He is  coughing more- prod with white, bubbly sputum.     Dyspnea:  50 ft on 4lpm  Cough: worse p supper but not hs  Sleep: 4lpm 1 pillow  SABA use:  None  rec Prednisone 20 mg daily until breathing better, then 10 mg daily x 5 days and stop  Plan A = Automatic = symbicort 160 2 pfffs and spiriva 2 pffs each am and symbicort 160 mg 2 pffs each pm  Work on inhaler technique:  Plan B = Backup Only use your albuterol as a rescue medication     05/09/2018  f/u ov/Zachary [name] re: GOLD II criteria but severe 02 dep resp failure  Chief Complaint  Patient presents with  . Follow-up    increased shortness of breath. productive cough has subsided some.   Dyspnea: 50 ft  Cough: not much  Sleep: flat 3lpm  SABA use:  Hasn't used it   When takes off 02 at rest sats run around 85% / 3lpm low 90s  Walking on 6lpm and then 3-4 lpm most of the rest of the time    No obvious day to day or daytime variability or assoc excess/ purulent sputum or mucus plugs or hemoptysis or cp or chest tightness, subjective wheeze or overt sinus or hb symptoms. No unusual exposure hx or h/o childhood pna/ asthma or knowledge of premature birth.  Sleeping flat on 3lpm   without nocturnal  or early am exacerbation  of respiratory  c/o's or need for noct saba. Also denies any obvious fluctuation of symptoms with weather or environmental changes or other aggravating or alleviating factors except as outlined above   Current Allergies, Complete Past Medical History, Past Surgical History, Family History, and Social History were reviewed in [employer] record.  ROS  The following are not active complaints unless bolded Hoarseness, sore throat, dysphagia, dental problems, itching, sneezing,  nasal congestion or discharge of excess mucus or purulent secretions, ear ache,   fever, chills, sweats, unintended wt loss or wt gain, classically pleuritic or exertional cp,  orthopnea pnd or arm/hand swelling  or leg  swelling, presyncope, palpitations, abdominal pain, anorexia, nausea, vomiting, diarrhea  or change in bowel habits or change in bladder habits, change in stools or change in urine, dysuria, hematuria,  rash, arthralgias, visual complaints, headache, numbness, weakness or ataxia or problems with walking or coordination,  change in mood or  memory.        Current Meds  Medication Sig  . albuterol (PROVENTIL HFA;VENTOLIN HFA) 108 (90 Base) MCG/ACT inhaler Inhale 2 puffs into the lungs every 6 (six) hours as needed for wheezing or shortness of breath.  Marland Kitchen aspirin 81 MG chewable tablet Chew 1 tablet (81 mg total) by mouth daily.  Marland Kitchen atorvastatin (LIPITOR) 10 MG tablet Take 1 tablet (10 mg total) by mouth at bedtime.  . bisoprolol (ZEBETA) 5 MG tablet Take 2.5 mg by mouth daily.  . budesonide-formoterol (SYMBICORT) 160-4.5 MCG/ACT inhaler Inhale 2 puffs into the lungs 2 (two) times daily.  . furosemide (LASIX) 40 MG tablet Take 40 mg by mouth.  . losartan (COZAAR) 25 MG tablet Take 25 mg by mouth at bedtime.  Marland Kitchen omeprazole (PRILOSEC) 40 MG capsule TAKE 1 CAPSULE (40 MG TOTAL) BY MOUTH DAILY.  Marland Kitchen OXYGEN 4lpm 24/7  [facility]  . predniSONE (DELTASONE) 20 MG tablet Take 20 mg by mouth daily as needed.  Marland Kitchen spironolactone (ALDACTONE) 25 MG tablet Take 0.5 tablets (12.5 mg total) by mouth daily.  . Tiotropium Bromide Monohydrate (SPIRIVA RESPIMAT) 2.5 MCG/ACT AERS Inhale 2 puffs into the lungs daily.  .                                 Objective:      Physical Exam   amb bm nad   05/09/2018        153   03/21/2018  150   11/28/16 156 lb 12.8 oz (71.1 kg)  06/22/16 144 lb 9.6 oz (65.6 kg)  05/21/16 141 lb 6.4 oz (64.1 kg)     Vital signs reviewed - Note on arrival 02 sats  86% on 6lpm          HEENT: nl dentition / oropharynx. Nl external ear canals without cough reflex - moderate bilateral non-specific turbinate edema     NECK :  without JVD/Nodes/TM/ nl carotid upstrokes  bilaterally   LUNGS: no acc muscle use,  Mod barrel  contour chest wall with bilateral  Distant bs s audible wheeze and  without cough on insp or exp maneuver and mod   Hyperresonant  to  percussion bilaterally     CV:  RRR  no s3 or murmur or increase in P2, and trace bilateral lower ext pitting edema   ABD:  soft and nontender with pos mid insp Hoover's  in the supine position. No bruits or organomegaly appreciated, bowel sounds nl  MS:   Nl gait/  ext warm without deformities, calf tenderness, cyanosis or clubbing No obvious joint restrictions   SKIN: warm and dry without lesions    NEURO:  alert, approp, nl sensorium with  no motor or cerebellar deficits apparent.                           Assessment:

## 2018-05-09 NOTE — Patient Instructions (Addendum)
We will try to refer you to [facility]   Goal for 02 is to keep above 90% if possible   No change on your inhalers    Please schedule a follow up visit in 3 months but call sooner if needed

## 2018-05-10 ENCOUNTER — Other Ambulatory Visit: Payer: Self-pay

## 2018-05-10 NOTE — Patient Outreach (Signed)
[facility]) Care Management  05/10/2018  LISLE SAFRANSKI 1951/03/30 [id number]   Medication Adherence call to Mr. Joshaua Yurkovich patient did not answer he is past due on Atorvastatin 10 mg and Losartan 25 mg [facility] said patient pick up both medication on 05/10/18 for a 30 days supply. patient wont be due until June/2019.patient is showing up under [facility] Ins.for Medication Adherence.   Lillia Abed CPhT Pharmacy Technician [facility] Care Management Direct Dial [phone]  Fax [phone] [email or web address]

## 2018-05-15 ENCOUNTER — Encounter: Payer: Self-pay | Admitting: Internal Medicine

## 2018-05-15 NOTE — Assessment & Plan Note (Signed)
Spirometry 11/28/2016  FEV1 2.29 (75%)  Ratio 55  On BREO> no change symptoms 01/03/2017 off bre - quit smoking 02/2018 with onset of resp failure/ cor pulmonale - 03/21/2018   try add spiriva   - The proper method of use, as well as anticipated side effects, of a metered-dose inhaler are discussed and demonstrated to the patient.    Group D in terms of symptom/risk and laba/lama/ICS  therefore appropriate rx at this point.  Would benefit most from rehab> referred   I had an extended discussion with the patient reviewing all relevant studies completed to date and  lasting 15 to 20 minutes of a 25 minute visit    See device teaching which extended face to face time for this visit.  Each maintenance medication was reviewed in detail including emphasizing most importantly the difference between maintenance and prns and under what circumstances the prns are to be triggered using an action plan format that is not reflected in the computer generated alphabetically organized AVS which I have not found useful in most complex patients, especially with respiratory illnesses  Please see AVS for specific instructions unique to this visit that I personally wrote and verbalized to the the pt in detail and then reviewed with pt  by my nurse highlighting any  changes in therapy recommended at today's visit to their plan of care.

## 2018-05-15 NOTE — Assessment & Plan Note (Signed)
1. Low right and left heart filling pressures.  2. Moderate pulmonary arterial hypertension, likely group 3 PH related to severe COPD.  3. Nonobstructive coronary disease.   rx per Dr Jearld Pies > also defer decision re TEE to Dr Jearld Pies

## 2018-05-15 NOTE — Assessment & Plan Note (Addendum)
Started on 02 at d/c 03/06/18 at 4lpm 24/7    05/09/2018 rec 4lpm at rest and 6lpm walking with goal of keeping above 90% if possible   ? Could he have patent Foramen ovale causing now R to L shunt and ? Would it make sense to repair this in setting of PH   Also ? Candidate for transplant heart / lung at age 67 ?

## 2018-05-17 ENCOUNTER — Telehealth: Payer: Self-pay | Admitting: Family Medicine

## 2018-05-17 NOTE — Telephone Encounter (Signed)
Informed pt received first request, provider not sure how to fill out first section, several attempts made to contact company. Requested new form end of last week. Did not received till today, will have signed and returned tomorrow

## 2018-05-24 ENCOUNTER — Other Ambulatory Visit (HOSPITAL_COMMUNITY): Payer: Self-pay

## 2018-05-24 DIAGNOSIS — R9389 Abnormal findings on diagnostic imaging of other specified body structures: Secondary | ICD-10-CM

## 2018-05-29 ENCOUNTER — Encounter (HOSPITAL_COMMUNITY): Payer: Self-pay

## 2018-05-29 ENCOUNTER — Other Ambulatory Visit: Payer: Self-pay

## 2018-05-29 ENCOUNTER — Other Ambulatory Visit (HOSPITAL_COMMUNITY): Payer: Self-pay

## 2018-05-29 ENCOUNTER — Ambulatory Visit (HOSPITAL_BASED_OUTPATIENT_CLINIC_OR_DEPARTMENT_OTHER)
Admission: RE | Admit: 2018-05-29 | Discharge: 2018-05-29 | Disposition: A | Discharge status: Home / Self Care | Payer: Medicare Other | Source: Ambulatory Visit | Attending: Cardiology | Admitting: Cardiology

## 2018-05-29 ENCOUNTER — Encounter (HOSPITAL_COMMUNITY): Payer: Self-pay | Admitting: Cardiology

## 2018-05-29 ENCOUNTER — Ambulatory Visit (HOSPITAL_COMMUNITY)
Admission: RE | Admit: 2018-05-29 | Discharge: 2018-05-29 | Disposition: A | Discharge status: Home / Self Care | Payer: Medicare Other | Source: Ambulatory Visit | Attending: Cardiology | Admitting: Cardiology

## 2018-05-29 VITALS — BP 99/57 | HR 119 | Wt 150.8 lb

## 2018-05-29 DIAGNOSIS — Z09 Encounter for follow-up examination after completed treatment for conditions other than malignant neoplasm: Secondary | ICD-10-CM | POA: Insufficient documentation

## 2018-05-29 DIAGNOSIS — R9389 Abnormal findings on diagnostic imaging of other specified body structures: Secondary | ICD-10-CM | POA: Diagnosis not present

## 2018-05-29 DIAGNOSIS — I5032 Chronic diastolic (congestive) heart failure: Secondary | ICD-10-CM | POA: Diagnosis not present

## 2018-05-29 DIAGNOSIS — I272 Pulmonary hypertension, unspecified: Secondary | ICD-10-CM | POA: Insufficient documentation

## 2018-05-29 DIAGNOSIS — Z77090 Contact with and (suspected) exposure to asbestos: Secondary | ICD-10-CM | POA: Insufficient documentation

## 2018-05-29 DIAGNOSIS — Z87891 Personal history of nicotine dependence: Secondary | ICD-10-CM | POA: Diagnosis not present

## 2018-05-29 DIAGNOSIS — Z7982 Long term (current) use of aspirin: Secondary | ICD-10-CM | POA: Diagnosis not present

## 2018-05-29 DIAGNOSIS — Z79899 Other long term (current) drug therapy: Secondary | ICD-10-CM | POA: Diagnosis not present

## 2018-05-29 DIAGNOSIS — K219 Gastro-esophageal reflux disease without esophagitis: Secondary | ICD-10-CM | POA: Diagnosis not present

## 2018-05-29 DIAGNOSIS — I50812 Chronic right heart failure: Secondary | ICD-10-CM | POA: Diagnosis not present

## 2018-05-29 DIAGNOSIS — E785 Hyperlipidemia, unspecified: Secondary | ICD-10-CM | POA: Diagnosis not present

## 2018-05-29 DIAGNOSIS — N183 Chronic kidney disease, stage 3 unspecified: Secondary | ICD-10-CM

## 2018-05-29 DIAGNOSIS — I429 Cardiomyopathy, unspecified: Secondary | ICD-10-CM | POA: Insufficient documentation

## 2018-05-29 DIAGNOSIS — I130 Hypertensive heart and chronic kidney disease with heart failure and stage 1 through stage 4 chronic kidney disease, or unspecified chronic kidney disease: Secondary | ICD-10-CM | POA: Diagnosis present

## 2018-05-29 DIAGNOSIS — J449 Chronic obstructive pulmonary disease, unspecified: Secondary | ICD-10-CM | POA: Insufficient documentation

## 2018-05-29 DIAGNOSIS — I2510 Atherosclerotic heart disease of native coronary artery without angina pectoris: Secondary | ICD-10-CM | POA: Insufficient documentation

## 2018-05-29 DIAGNOSIS — Z7952 Long term (current) use of systemic steroids: Secondary | ICD-10-CM | POA: Diagnosis not present

## 2018-05-29 DIAGNOSIS — Z8249 Family history of ischemic heart disease and other diseases of the circulatory system: Secondary | ICD-10-CM | POA: Diagnosis not present

## 2018-05-29 DIAGNOSIS — R000 Tachycardia, unspecified: Secondary | ICD-10-CM | POA: Insufficient documentation

## 2018-05-29 DIAGNOSIS — Q211 Atrial septal defect: Secondary | ICD-10-CM | POA: Diagnosis not present

## 2018-05-29 DIAGNOSIS — Z9981 Dependence on supplemental oxygen: Secondary | ICD-10-CM | POA: Insufficient documentation

## 2018-05-29 MED ORDER — FUROSEMIDE 40 MG PO TABS: 40.0000 mg | ORAL_TABLET | Freq: Every day | ORAL | 3 refills | Status: AC

## 2018-05-29 NOTE — Patient Instructions (Signed)
Increase 40 mg (1 tab) daily  Your physician has requested that you have a TEE. During a TEE, sound waves are used to create images of your heart. It provides your doctor with information about the size and shape of your heart and how well your heart's chambers and valves are working. In this test, a transducer is attached to the end of a flexible tube that's guided down your throat and into your esophagus (the tube leading from you mouth to your stomach) to get a more detailed image of your heart. You are not awake for the procedure. Please see the instruction sheet given to you today. For further information please visit [email or web address]   Your physician recommends that you schedule a follow-up appointment in: 4 months with Dr. Shirlee Latch  Please Call an Schedule Appointment (call in August)

## 2018-05-29 NOTE — Progress Notes (Signed)
  Echocardiogram 2D Echocardiogram has been performed.  Celene Skeen 05/29/2018, 2:04 PM

## 2018-05-30 ENCOUNTER — Other Ambulatory Visit (HOSPITAL_COMMUNITY): Payer: Self-pay

## 2018-05-30 ENCOUNTER — Telehealth: Payer: Self-pay | Admitting: Family Medicine

## 2018-05-30 DIAGNOSIS — I340 Nonrheumatic mitral (valve) insufficiency: Secondary | ICD-10-CM

## 2018-05-30 DIAGNOSIS — I5022 Chronic systolic (congestive) heart failure: Secondary | ICD-10-CM

## 2018-05-30 NOTE — Telephone Encounter (Signed)
LMOVM will refax ppw

## 2018-05-30 NOTE — H&P (View-Only) (Signed)
PCP: Dr Dettinger  Primary Cardiologist: Dr Shirlee Latch   HPI: Zachary Scal[place] 67 y.o.malewith a history of COPD, HTN, and chronic RUL infiltrate.  Admitted 3/13 through 03/06/2018. Echo in 3/19 showed EF 40-45% with moderately decreased RV systolic function.  Admitted with increased dyspnea. Diuresed with IV lasix and transitioned to 20 mg lasix daily. Had RHC with low filling pressures and moderate PAH related to severe COPD. LHC showed nonobstructive CAD.  He was discharged with home oxygen and plans for outpatient sleep study. Discharge weight 132 pounds.   Echo today showed severely dilated RV with severely decreased RV systolic function, LV EF 55%, D-shaped, septum, bubble study positive suggesting PFO.   Today he returns for HF follow up. He continues to have a lot of trouble maintaining his oxygen saturation > 90%.  If his oxygen saturation is > 90%, he does not get short of breath.  No chest pain.  No lightheadedness/syncope. Weight is up about 3 lbs.  He has only been taking Lasix every other day.   Labs (4/19): K 4.4, creatinine 1.52, BNP 524 Labs (5/19): K 4.6, creatinine 1.37  ECG (personally reviewed): sinus tachy 102, right axis  PMH: 1. COPD: Prior smoker.  On home oxygen.  - PFTs (2/18) with moderate obstructive airways disease.  - CT chest 3/19 with emphysema.  2. Chronic RUL consolidation: Biopsy did not show malignancy (Dr. Dorris Fetch).  3. GERD 4. HTN 5. Asbestos exposure.  6. Pulmonary hypertension: Suspect group 3 due to severe COPD with hypoxemia.   - RHC (3/19) with mean RA 3, PA 52/10 mean 32, mean PCWP 5, CI 2.3, PVR 6.7 WU.  - CTA chest (3/19) with no PE.  - V/Q scan 3/19 with no evidence for chronic PE.  - Limited echo in 6/19 to look for PFO:  severely dilated RV with severely decreased RV systolic function, LV EF 55%, D-shaped, septum, bubble study positive suggesting PFO.  7. Chronic systolic CHF with prominent RV failure: Nonischemic  cardiomyopathy.  - LHC (3/19) with nonobstructive CAD.  - Echo (3/19) with mild focal basal septal hypertrophy, EF 40-45%, D-shaped interventricular septum, moderate-severe TR, severely dilated RV with moderately decreased systolic function.  - Echo (6/19):  Severely dilated RV with severely decreased RV systolic function, LV EF 55%, D-shaped, septum, bubble study positive suggesting PFO.  8. CKD: Stage 3.  9. PFO  ROS: All systems negative except as listed in HPI, PMH and Problem List.  Social History   Socioeconomic History  . Marital status: Married    Spouse name: Not on file  . Number of children: Not on file  . Years of education: Not on file  . Highest education level: Not on file  Occupational History  . Not on file  Social Needs  . Financial resource strain: Not on file  . Food insecurity:    Worry: Not on file    Inability: Not on file  . Transportation needs:    Medical: Not on file    Non-medical: Not on file  Tobacco Use  . Smoking status: Former Smoker    Packs/day: 1.00    Years: 40.00    Pack years: 40.00    Types: Cigarettes    Start date: 01/27/1974    Last attempt to quit: 02/16/2018    Years since quitting: 0.2  . Smokeless tobacco: Never Used  Substance and Sexual Activity  . Alcohol use: No  . Drug use: No  . Sexual activity: Not on file  Lifestyle  . Physical activity:    Days per week: Not on file    Minutes per session: Not on file  . Stress: Not on file  Relationships  . Social connections:    Talks on phone: Not on file    Gets together: Not on file    Attends religious service: Not on file    Active member of club or organization: Not on file    Attends meetings of clubs or organizations: Not on file    Relationship status: Not on file  . Intimate partner violence:    Fear of current or ex partner: Not on file    Emotionally abused: Not on file    Physically abused: Not on file    Forced sexual activity: Not on file  Other Topics  Concern  . Not on file  Social History Narrative  . Not on file    Family History  Problem Relation Age of Onset  . Heart disease Father     Current Outpatient Medications  Medication Sig Dispense Refill  . albuterol (PROVENTIL HFA;VENTOLIN HFA) 108 (90 Base) MCG/ACT inhaler Inhale 2 puffs into the lungs every 6 (six) hours as needed for wheezing or shortness of breath. 1 Inhaler 0  . aspirin 81 MG chewable tablet Chew 1 tablet (81 mg total) by mouth daily. 30 tablet 3  . atorvastatin (LIPITOR) 10 MG tablet Take 1 tablet (10 mg total) by mouth at bedtime. 30 tablet 3  . bisoprolol (ZEBETA) 5 MG tablet Take 2.5 mg by mouth at bedtime.     . budesonide-formoterol (SYMBICORT) 160-4.5 MCG/ACT inhaler Inhale 2 puffs into the lungs 2 (two) times daily. 1 Inhaler 0  . furosemide (LASIX) 40 MG tablet Take 1 tablet (40 mg total) by mouth daily. 30 tablet 3  . losartan (COZAAR) 25 MG tablet Take 25 mg by mouth at bedtime.    Marland Kitchen omeprazole (PRILOSEC) 40 MG capsule TAKE 1 CAPSULE (40 MG TOTAL) BY MOUTH DAILY. (Patient taking differently: Take 40 mg by mouth daily before breakfast. ) 90 capsule 1  . OXYGEN 4lpm 24/7  [facility]    . predniSONE (DELTASONE) 20 MG tablet Take 20 mg by mouth daily as needed (to increase patient appetite).     Marland Kitchen spironolactone (ALDACTONE) 25 MG tablet Take 0.5 tablets (12.5 mg total) by mouth daily. (Patient taking differently: Take 12.5 mg by mouth at bedtime. ) 45 tablet 3  . Tiotropium Bromide Monohydrate (SPIRIVA RESPIMAT) 2.5 MCG/ACT AERS Inhale 2 puffs into the lungs daily. 1 Inhaler 11   No current facility-administered medications for this encounter.     Vitals:   05/29/18 1405  BP: (!) 99/57  Pulse: (!) 119  SpO2: (!) 84%  Weight: 150 lb 12.8 oz (68.4 kg)    PHYSICAL EXAM: General: NAD Neck: JVP 9-10 cm, no thyromegaly or thyroid nodule.  Lungs: Decreased breath sounds bilaterally CV: Nondisplaced PMI.  Heart regular S1/S2, no S3/S4, no murmur.  2+ ankle  edema.  No carotid bruit.  Normal pedal pulses.  Abdomen: Soft, nontender, no hepatosplenomegaly, no distention.  Skin: Intact without lesions or rashes.  Neurologic: Alert and oriented x 3.  Psych: Normal affect. Extremities: No clubbing or cyanosis.  HEENT: Normal.   ASSESSMENT & PLAN:  1. Chronic Diastolic Heart Failure: With prominent RV dysfunction. Low filling pressures with moderate PAH on RHC 3/18. LHC showed nonobstructive CAD.  Limited echo was reviewed today, showing LV EF 55% but severely dilated RV with severely decreased  systolic function.  NYHA class III symptoms.  He is volume overloaded on exam, suspect primarily RV failure.  - Increase Lasix to 40 mg daily rather than every other day.  BMET today.  - Continue bisoprolol 2.5 mg daily and losartan 25 mg daily.  Will have to be careful with titration given orthostatic symptoms.  - Continue spironolactone 12.5 mg daily.  2. COPD: Former heavy smoker. Moderate-severe emphysema on CT chest. Oxygen saturation has been more difficult to maintain than expected.  Patient has a PFO with some right to left shunting that may perpetuate hypoxemia (or it may be too small to have much affect). - I will arrange for TEE to assess PFO.  If it truly is a substantial PFO, may be worthwhile trying to close.  However, this would run the risk of rise in PA pressure and worsening RV failure/cardiovascular collapse.  I discussed the case with Dr. Excell Seltzer.  Would plan to temporarily occlude the PFO, and if PA pressure does not rise and oxygen saturation improves, would place closure device. We discussed risks/benefits of TEE and he agrees to procedure.  - On home oxygen.  - Refer to pulmonary rehab.  3. CKD stage 3: Follow BMET closely with diuresis.  4. Pulmonary HTN: Suspect primarily group 3 from severe COPD with hypoxemia.  V/Q scan was not suggestive of chronic PE.  - Needs sleep study.   5. CAD: Nonobstructive on LHC - Continue aspirin and statin.   6. RUL consolidation: Chronic, followed by pulmonary.   Followup in 4 months.   Marca Ancona 05/30/2018

## 2018-05-30 NOTE — Progress Notes (Signed)
PCP: Dr Dettinger  Primary Cardiologist: Dr Shirlee Latch   HPI: Zachary Scales [place] 67 y.o.malewith a history of COPD, HTN, and chronic RUL infiltrate.  Admitted 3/13 through 03/06/2018. Echo in 3/19 showed EF 40-45% with moderately decreased RV systolic function.  Admitted with increased dyspnea. Diuresed with IV lasix and transitioned to 20 mg lasix daily. Had RHC with low filling pressures and moderate PAH related to severe COPD. LHC showed nonobstructive CAD.  He was discharged with home oxygen and plans for outpatient sleep study. Discharge weight 132 pounds.   Echo today showed severely dilated RV with severely decreased RV systolic function, LV EF 55%, D-shaped, septum, bubble study positive suggesting PFO.   Today he returns for HF follow up. He continues to have a lot of trouble maintaining his oxygen saturation > 90%.  If his oxygen saturation is > 90%, he does not get short of breath.  No chest pain.  No lightheadedness/syncope. Weight is up about 3 lbs.  He has only been taking Lasix every other day.   Labs (4/19): K 4.4, creatinine 1.52, BNP 524 Labs (5/19): K 4.6, creatinine 1.37  ECG (personally reviewed): sinus tachy 102, right axis  PMH: 1. COPD: Prior smoker.  On home oxygen.  - PFTs (2/18) with moderate obstructive airways disease.  - CT chest 3/19 with emphysema.  2. Chronic RUL consolidation: Biopsy did not show malignancy (Dr. Dorris Fetch).  3. GERD 4. HTN 5. Asbestos exposure.  6. Pulmonary hypertension: Suspect group 3 due to severe COPD with hypoxemia.   - RHC (3/19) with mean RA 3, PA 52/10 mean 32, mean PCWP 5, CI 2.3, PVR 6.7 WU.  - CTA chest (3/19) with no PE.  - V/Q scan 3/19 with no evidence for chronic PE.  - Limited echo in 6/19 to look for PFO:  severely dilated RV with severely decreased RV systolic function, LV EF 55%, D-shaped, septum, bubble study positive suggesting PFO.  7. Chronic systolic CHF with prominent RV failure: Nonischemic  cardiomyopathy.  - LHC (3/19) with nonobstructive CAD.  - Echo (3/19) with mild focal basal septal hypertrophy, EF 40-45%, D-shaped interventricular septum, moderate-severe TR, severely dilated RV with moderately decreased systolic function.  - Echo (6/19):  Severely dilated RV with severely decreased RV systolic function, LV EF 55%, D-shaped, septum, bubble study positive suggesting PFO.  8. CKD: Stage 3.  9. PFO  ROS: All systems negative except as listed in HPI, PMH and Problem List.  Social History   Socioeconomic History  . Marital status: Married    Spouse name: Not on file  . Number of children: Not on file  . Years of education: Not on file  . Highest education level: Not on file  Occupational History  . Not on file  Social Needs  . Financial resource strain: Not on file  . Food insecurity:    Worry: Not on file    Inability: Not on file  . Transportation needs:    Medical: Not on file    Non-medical: Not on file  Tobacco Use  . Smoking status: Former Smoker    Packs/day: 1.00    Years: 40.00    Pack years: 40.00    Types: Cigarettes    Start date: 01/27/1974    Last attempt to quit: 02/16/2018    Years since quitting: 0.2  . Smokeless tobacco: Never Used  Substance and Sexual Activity  . Alcohol use: No  . Drug use: No  . Sexual activity: Not on file  Lifestyle  . Physical activity:    Days per week: Not on file    Minutes per session: Not on file  . Stress: Not on file  Relationships  . Social connections:    Talks on phone: Not on file    Gets together: Not on file    Attends religious service: Not on file    Active member of club or organization: Not on file    Attends meetings of clubs or organizations: Not on file    Relationship status: Not on file  . Intimate partner violence:    Fear of current or ex partner: Not on file    Emotionally abused: Not on file    Physically abused: Not on file    Forced sexual activity: Not on file  Other Topics  Concern  . Not on file  Social History Narrative  . Not on file    Family History  Problem Relation Age of Onset  . Heart disease Father     Current Outpatient Medications  Medication Sig Dispense Refill  . albuterol (PROVENTIL HFA;VENTOLIN HFA) 108 (90 Base) MCG/ACT inhaler Inhale 2 puffs into the lungs every 6 (six) hours as needed for wheezing or shortness of breath. 1 Inhaler 0  . aspirin 81 MG chewable tablet Chew 1 tablet (81 mg total) by mouth daily. 30 tablet 3  . atorvastatin (LIPITOR) 10 MG tablet Take 1 tablet (10 mg total) by mouth at bedtime. 30 tablet 3  . bisoprolol (ZEBETA) 5 MG tablet Take 2.5 mg by mouth at bedtime.     . budesonide-formoterol (SYMBICORT) 160-4.5 MCG/ACT inhaler Inhale 2 puffs into the lungs 2 (two) times daily. 1 Inhaler 0  . furosemide (LASIX) 40 MG tablet Take 1 tablet (40 mg total) by mouth daily. 30 tablet 3  . losartan (COZAAR) 25 MG tablet Take 25 mg by mouth at bedtime.    Marland Kitchen omeprazole (PRILOSEC) 40 MG capsule TAKE 1 CAPSULE (40 MG TOTAL) BY MOUTH DAILY. (Patient taking differently: Take 40 mg by mouth daily before breakfast. ) 90 capsule 1  . OXYGEN 4lpm 24/7  [facility]    . predniSONE (DELTASONE) 20 MG tablet Take 20 mg by mouth daily as needed (to increase patient appetite).     Marland Kitchen spironolactone (ALDACTONE) 25 MG tablet Take 0.5 tablets (12.5 mg total) by mouth daily. (Patient taking differently: Take 12.5 mg by mouth at bedtime. ) 45 tablet 3  . Tiotropium Bromide Monohydrate (SPIRIVA RESPIMAT) 2.5 MCG/ACT AERS Inhale 2 puffs into the lungs daily. 1 Inhaler 11   No current facility-administered medications for this encounter.     Vitals:   05/29/18 1405  BP: (!) 99/57  Pulse: (!) 119  SpO2: (!) 84%  Weight: 150 lb 12.8 oz (68.4 kg)    PHYSICAL EXAM: General: NAD Neck: JVP 9-10 cm, no thyromegaly or thyroid nodule.  Lungs: Decreased breath sounds bilaterally CV: Nondisplaced PMI.  Heart regular S1/S2, no S3/S4, no murmur.  2+ ankle  edema.  No carotid bruit.  Normal pedal pulses.  Abdomen: Soft, nontender, no hepatosplenomegaly, no distention.  Skin: Intact without lesions or rashes.  Neurologic: Alert and oriented x 3.  Psych: Normal affect. Extremities: No clubbing or cyanosis.  HEENT: Normal.   ASSESSMENT & PLAN:  1. Chronic Diastolic Heart Failure: With prominent RV dysfunction. Low filling pressures with moderate PAH on RHC 3/18. LHC showed nonobstructive CAD.  Limited echo was reviewed today, showing LV EF 55% but severely dilated RV with severely decreased  systolic function.  NYHA class III symptoms.  He is volume overloaded on exam, suspect primarily RV failure.  - Increase Lasix to 40 mg daily rather than every other day.  BMET today.  - Continue bisoprolol 2.5 mg daily and losartan 25 mg daily.  Will have to be careful with titration given orthostatic symptoms.  - Continue spironolactone 12.5 mg daily.  2. COPD: Former heavy smoker. Moderate-severe emphysema on CT chest. Oxygen saturation has been more difficult to maintain than expected.  Patient has a PFO with some right to left shunting that may perpetuate hypoxemia (or it may be too small to have much affect). - I will arrange for TEE to assess PFO.  If it truly is a substantial PFO, may be worthwhile trying to close.  However, this would run the risk of rise in PA pressure and worsening RV failure/cardiovascular collapse.  I discussed the case with Dr. Excell Seltzer.  Would plan to temporarily occlude the PFO, and if PA pressure does not rise and oxygen saturation improves, would place closure device. We discussed risks/benefits of TEE and he agrees to procedure.  - On home oxygen.  - Refer to pulmonary rehab.  3. CKD stage 3: Follow BMET closely with diuresis.  4. Pulmonary HTN: Suspect primarily group 3 from severe COPD with hypoxemia.  V/Q scan was not suggestive of chronic PE.  - Needs sleep study.   5. CAD: Nonobstructive on LHC - Continue aspirin and statin.   6. RUL consolidation: Chronic, followed by pulmonary.   Followup in 4 months.   Marca Ancona 05/30/2018

## 2018-06-01 ENCOUNTER — Other Ambulatory Visit: Payer: Self-pay | Admitting: *Deleted

## 2018-06-01 DIAGNOSIS — J181 Lobar pneumonia, unspecified organism: Secondary | ICD-10-CM

## 2018-06-06 ENCOUNTER — Encounter (HOSPITAL_COMMUNITY)
Admission: RE | Disposition: A | Discharge status: Home / Self Care | Payer: Self-pay | Source: Ambulatory Visit | Attending: Cardiology

## 2018-06-06 ENCOUNTER — Ambulatory Visit (HOSPITAL_COMMUNITY)
Admission: RE | Admit: 2018-06-06 | Discharge: 2018-06-06 | Disposition: A | Discharge status: Home / Self Care | Payer: Medicare Other | Source: Ambulatory Visit | Attending: Cardiology | Admitting: Cardiology

## 2018-06-06 ENCOUNTER — Ambulatory Visit (HOSPITAL_BASED_OUTPATIENT_CLINIC_OR_DEPARTMENT_OTHER)
Admission: RE | Admit: 2018-06-06 | Discharge: 2018-06-06 | Disposition: A | Discharge status: Home / Self Care | Payer: Medicare Other | Source: Ambulatory Visit | Attending: Cardiology | Admitting: Cardiology

## 2018-06-06 ENCOUNTER — Other Ambulatory Visit: Payer: Self-pay

## 2018-06-06 ENCOUNTER — Encounter (HOSPITAL_COMMUNITY): Payer: Self-pay

## 2018-06-06 ENCOUNTER — Encounter: Payer: Self-pay | Admitting: Internal Medicine

## 2018-06-06 DIAGNOSIS — Z7951 Long term (current) use of inhaled steroids: Secondary | ICD-10-CM | POA: Insufficient documentation

## 2018-06-06 DIAGNOSIS — I2510 Atherosclerotic heart disease of native coronary artery without angina pectoris: Secondary | ICD-10-CM | POA: Insufficient documentation

## 2018-06-06 DIAGNOSIS — I428 Other cardiomyopathies: Secondary | ICD-10-CM | POA: Diagnosis not present

## 2018-06-06 DIAGNOSIS — Q211 Atrial septal defect: Secondary | ICD-10-CM | POA: Insufficient documentation

## 2018-06-06 DIAGNOSIS — Z7952 Long term (current) use of systemic steroids: Secondary | ICD-10-CM | POA: Insufficient documentation

## 2018-06-06 DIAGNOSIS — K219 Gastro-esophageal reflux disease without esophagitis: Secondary | ICD-10-CM | POA: Insufficient documentation

## 2018-06-06 DIAGNOSIS — I130 Hypertensive heart and chronic kidney disease with heart failure and stage 1 through stage 4 chronic kidney disease, or unspecified chronic kidney disease: Secondary | ICD-10-CM | POA: Diagnosis not present

## 2018-06-06 DIAGNOSIS — N183 Chronic kidney disease, stage 3 (moderate): Secondary | ICD-10-CM | POA: Insufficient documentation

## 2018-06-06 DIAGNOSIS — Z9981 Dependence on supplemental oxygen: Secondary | ICD-10-CM | POA: Diagnosis not present

## 2018-06-06 DIAGNOSIS — Z7982 Long term (current) use of aspirin: Secondary | ICD-10-CM | POA: Insufficient documentation

## 2018-06-06 DIAGNOSIS — I340 Nonrheumatic mitral (valve) insufficiency: Secondary | ICD-10-CM

## 2018-06-06 DIAGNOSIS — I272 Pulmonary hypertension, unspecified: Secondary | ICD-10-CM | POA: Insufficient documentation

## 2018-06-06 DIAGNOSIS — J449 Chronic obstructive pulmonary disease, unspecified: Secondary | ICD-10-CM | POA: Insufficient documentation

## 2018-06-06 DIAGNOSIS — I5042 Chronic combined systolic (congestive) and diastolic (congestive) heart failure: Secondary | ICD-10-CM | POA: Insufficient documentation

## 2018-06-06 DIAGNOSIS — Q2112 Patent foramen ovale: Secondary | ICD-10-CM | POA: Insufficient documentation

## 2018-06-06 DIAGNOSIS — I5022 Chronic systolic (congestive) heart failure: Secondary | ICD-10-CM

## 2018-06-06 DIAGNOSIS — Z87891 Personal history of nicotine dependence: Secondary | ICD-10-CM | POA: Diagnosis not present

## 2018-06-06 HISTORY — PX: TEE WITHOUT CARDIOVERSION: SHX5443

## 2018-06-06 SURGERY — ECHOCARDIOGRAM, TRANSESOPHAGEAL
Anesthesia: Moderate Sedation

## 2018-06-06 MED ORDER — SODIUM CHLORIDE 0.9 % IV SOLN
INTRAVENOUS | Status: DC
Start: 2018-06-06 — End: 2018-06-06

## 2018-06-06 MED ORDER — BUTAMBEN-TETRACAINE-BENZOCAINE 2-2-14 % EX AERO
INHALATION_SPRAY | CUTANEOUS | Status: DC | PRN
  Administered 2018-06-06: 2 via TOPICAL

## 2018-06-06 MED ORDER — MIDAZOLAM HCL 10 MG/2ML IJ SOLN
INTRAMUSCULAR | Status: DC | PRN
  Administered 2018-06-06 (×2): 1 mg via INTRAVENOUS

## 2018-06-06 MED ORDER — FENTANYL CITRATE (PF) 100 MCG/2ML IJ SOLN
INTRAMUSCULAR | Status: DC | PRN
  Administered 2018-06-06: 25 ug via INTRAVENOUS

## 2018-06-06 MED ORDER — FENTANYL CITRATE (PF) 100 MCG/2ML IJ SOLN
INTRAMUSCULAR | Status: AC
  Filled 2018-06-06: qty 2

## 2018-06-06 MED ORDER — SODIUM CHLORIDE 0.9 % IV SOLN: INTRAVENOUS | Status: DC

## 2018-06-06 MED ORDER — MIDAZOLAM HCL 5 MG/ML IJ SOLN
INTRAMUSCULAR | Status: AC
  Filled 2018-06-06: qty 2

## 2018-06-06 NOTE — Progress Notes (Signed)
  Echocardiogram Echocardiogram Transesophageal has been performed.  Zachary [name] T Chyann Ambrocio 06/06/2018, 1:00 PM

## 2018-06-06 NOTE — Interval H&P Note (Signed)
History and Physical Interval Note:  06/06/2018 12:31 PM  Zachary [name]  has presented today for surgery, with the diagnosis of HEART FAILURE  The various methods of treatment have been discussed with the patient and family. After consideration of risks, benefits and other options for treatment, the patient has consented to  Procedure(s): TRANSESOPHAGEAL ECHOCARDIOGRAM (TEE) (N/A) as a surgical intervention .  The patient's history has been reviewed, patient examined, no change in status, stable for surgery.  I have reviewed the patient's chart and labs.  Questions were answered to the patient's satisfaction.     Dalton [employer]

## 2018-06-06 NOTE — Discharge Instructions (Signed)
Transesophageal Echocardiogram Transesophageal echocardiography (TEE) is a special type of test that produces images of the heart by using sound waves (echocardiogram). This type of echocardiography can obtain better images of the heart than standard echocardiography. TEE is done by passing a flexible tube down the esophagus. The heart is located in front of the esophagus. Because the heart and esophagus are close to one another, your health care provider can take very clear, detailed pictures of the heart via ultrasound waves. TEE may be done:  If your health care provider needs more information based on standard echocardiography findings.  If you had a stroke. This might have happened because a clot formed in your heart. TEE can visualize different areas of the heart and check for clots.  To check valve anatomy and function.  To check for infection on the inside of your heart (endocarditis).  To evaluate the dividing wall (septum) of the heart and presence of a hole that did not close after birth (patent foramen ovale or atrial septal defect).  To help diagnose a tear in the wall of the aorta (aortic dissection).  During cardiac valve surgery. This allows the surgeon to assess the valve repair before closing the chest.  During a variety of other cardiac procedures to guide positioning of catheters.  Sometimes before a cardioversion, which is a shock to convert heart rhythm back to normal.  Tell a health care provider about:  Any allergies you have.  All medicines you are taking, including vitamins, herbs, eye drops, creams, and over-the-counter medicines.  Any problems you or family members have had with anesthetic medicines.  Any blood disorders you have.  Any surgeries you have had.  Any medical conditions you have.  Swallowing difficulties.  An esophageal obstruction. What are the risks? Generally, TEE is a safe procedure. However, as with any procedure, complications can  occur. Possible complications include an esophageal tear (rupture). What happens before the procedure?  Do not eat or drink for 6 hours before the procedure or as directed by your health care provider.  Arrange for someone to drive you home after the procedure. Do not drive yourself home. During the procedure, you will be given medicines that can continue to make you feel drowsy and can impair your reflexes.  An IV access tube will be started in the arm. What happens during the procedure?  A medicine to help you relax (sedative) will be given through the IV access tube.  A medicine may be sprayed or gargled to numb the back of the throat.  Your blood pressure, heart rate, and breathing (vital signs) will be monitored during the procedure.  The TEE probe is a long, flexible tube. The tip of the probe is placed into the back of the mouth, and you will be asked to swallow. This helps to pass the tip of the probe into the esophagus. Once the tip of the probe is in the correct area, your health care provider can take pictures of the heart.  TEE is usually not a painful procedure. You may feel the probe press against the back of the throat. The probe does not enter the trachea and does not affect your breathing. What happens after the procedure?  You will be in bed, resting, until you have fully returned to consciousness.  When you first awaken, your throat may feel slightly sore and will probably still feel numb. This will improve slowly over time.  You will not be allowed to eat or drink until  it is clear that the numbness has improved.  Once you have been able to drink, urinate, and sit on the edge of the bed without feeling sick to your stomach (nausea) or dizzy, you may be cleared to go home.  You should have a friend or family member with you for the next 24 hours after your procedure. This information is not intended to replace advice given to you by your health care provider. Make  sure you discuss any questions you have with your health care provider. Document Released: 02/25/2003 Document Revised: 05/12/2016 Document Reviewed: 06/06/2013 Elsevier Interactive Patient Education  2018 Elsevier Inc. Moderate Conscious Sedation, Adult Sedation is the use of medicines to promote relaxation and relieve discomfort and anxiety. Moderate conscious sedation is a type of sedation. Under moderate conscious sedation, you are less alert than normal, but you are still able to respond to instructions, touch, or both. Moderate conscious sedation is used during short medical and dental procedures. It is milder than deep sedation, which is a type of sedation under which you cannot be easily woken up. It is also milder than general anesthesia, which is the use of medicines to make you unconscious. Moderate conscious sedation allows you to return to your regular activities sooner. Tell a health care provider about:  Any allergies you have.  All medicines you are taking, including vitamins, herbs, eye drops, creams, and over-the-counter medicines.  Use of steroids (by mouth or creams).  Any problems you or family members have had with sedatives and anesthetic medicines.  Any blood disorders you have.  Any surgeries you have had.  Any medical conditions you have, such as sleep apnea.  Whether you are pregnant or may be pregnant.  Any use of cigarettes, alcohol, marijuana, or street drugs. What are the risks? Generally, this is a safe procedure. However, problems may occur, including:  Getting too much medicine (oversedation).  Nausea.  Allergic reaction to medicines.  Trouble breathing. If this happens, a breathing tube may be used to help with breathing. It will be removed when you are awake and breathing on your own.  Heart trouble.  Lung trouble.  What happens before the procedure? Staying hydrated Follow instructions from your health care provider about hydration,  which may include:  Up to 2 hours before the procedure - you may continue to drink clear liquids, such as water, clear fruit juice, black coffee, and plain tea.  Eating and drinking restrictions Follow instructions from your health care provider about eating and drinking, which may include:  8 hours before the procedure - stop eating heavy meals or foods such as meat, fried foods, or fatty foods.  6 hours before the procedure - stop eating light meals or foods, such as toast or cereal.  6 hours before the procedure - stop drinking milk or drinks that contain milk.  2 hours before the procedure - stop drinking clear liquids.  Medicine  Ask your health care provider about:  Changing or stopping your regular medicines. This is especially important if you are taking diabetes medicines or blood thinners.  Taking medicines such as aspirin and ibuprofen. These medicines can thin your blood. Do not take these medicines before your procedure if your health care provider instructs you not to.  Tests and exams  You will have a physical exam.  You may have blood tests done to show: ? How well your kidneys and liver are working. ? How well your blood can clot. General instructions  Plan to  have someone take you home from the hospital or clinic.  If you will be going home right after the procedure, plan to have someone with you for 24 hours. What happens during the procedure?  An IV tube will be inserted into one of your veins.  Medicine to help you relax (sedative) will be given through the IV tube.  The medical or dental procedure will be performed. What happens after the procedure?  Your blood pressure, heart rate, breathing rate, and blood oxygen level will be monitored often until the medicines you were given have worn off.  Do not drive for 24 hours. This information is not intended to replace advice given to you by your health care provider. Make sure you discuss any questions  you have with your health care provider. Document Released: 08/30/2001 Document Revised: 05/10/2016 Document Reviewed: 03/26/2016 Elsevier Interactive Patient Education  [employer].

## 2018-06-06 NOTE — CV Procedure (Signed)
Procedure: TEE  Sedation: Versed 2 mg IV, Fentanyl 25 mcg IV  Indication: PFO  Findings: Please see echo section for full report.  Normal LV size with EF 55%.  Normal wall thickness and motion.  The LV is compressed by the enlarged RV. The RV is severely dilated with moderately decreased systolic function.  D-shaped interventricular septum suggestive of RV pressure/volume overload.  Severe right atrial enlargement, left atrium.  There appeared to be a small PFO with positive bubble study.  Moderate-severe TR, peak RV-RA gradient 59 mmHg.  Trivial MR.  Trileaflet aortic valve with trivial aortic insufficiency.  No aortic stenosis.  Normal caliber aorta with minimal plaque.   Impression: Small PFO.  Will review with Dr. Excell Seltzer, I am not sure that closure here would be very beneficial.   Zachary [name] 06/06/2018 12:50 PM

## 2018-06-07 ENCOUNTER — Encounter (HOSPITAL_COMMUNITY): Payer: Self-pay | Admitting: Cardiology

## 2018-06-07 NOTE — Progress Notes (Signed)
Please let Mr Fank know that I reviewed his TEE with Dr. Excell Seltzer.  PFO is very small and closing it would be very unlikely to improve his oxygen saturation so would not suggest closure procedure.

## 2018-06-07 NOTE — Progress Notes (Signed)
Reviewed images. PFO appears very small and I agree not suitable for closure. Don't think it's playing a role in hypoxemia. thx

## 2018-06-12 ENCOUNTER — Encounter (HOSPITAL_COMMUNITY)
Admission: RE | Admit: 2018-06-12 | Discharge: 2018-06-12 | Disposition: A | Discharge status: Home / Self Care | Payer: No Typology Code available for payment source | Source: Ambulatory Visit | Attending: Internal Medicine | Admitting: Internal Medicine

## 2018-06-14 ENCOUNTER — Telehealth (HOSPITAL_COMMUNITY): Payer: Self-pay | Admitting: Cardiology

## 2018-06-14 ENCOUNTER — Telehealth: Payer: Self-pay | Admitting: Internal Medicine

## 2018-06-14 DIAGNOSIS — J438 Other emphysema: Secondary | ICD-10-CM

## 2018-06-14 NOTE — Telephone Encounter (Signed)
        Attempted to call Diane Coad with [facility] with [facility] Diane is requesting a physical therapy referral from MW. I did not receive an answer at time of call. I have left a voicemail message for pt to return call. X1

## 2018-06-14 NOTE — Telephone Encounter (Signed)
Patients wife left message on triage line requesting the results of his TEE   Attempted to return call to patient. LMOM

## 2018-06-18 ENCOUNTER — Other Ambulatory Visit: Payer: Self-pay | Admitting: Family Medicine

## 2018-06-18 DIAGNOSIS — J441 Chronic obstructive pulmonary disease with (acute) exacerbation: Secondary | ICD-10-CM

## 2018-06-18 NOTE — Telephone Encounter (Signed)
Attempted to call Diane. I did not receive an answer. I have left a message for Diane to return our call.

## 2018-06-18 NOTE — Progress Notes (Signed)
Called pt. Left message to call back.

## 2018-06-19 NOTE — Telephone Encounter (Signed)
Attempted to contact Diane but unable to reach her. Left a message for Diane to return call.

## 2018-06-20 NOTE — Telephone Encounter (Signed)
Called and spoke with Diane, she states that the patient went to rehab for orientation and they states that he is not strong enough for the program. They have suggested that the patient go to physical therapy first.    MW please advise thank you

## 2018-06-20 NOTE — Telephone Encounter (Signed)
Do advanced home pulmonary rehab if eligible

## 2018-06-20 NOTE — Telephone Encounter (Signed)
Order has been placed per Dr. Sherene Sires.

## 2018-06-22 ENCOUNTER — Telehealth: Payer: Self-pay | Admitting: Family Medicine

## 2018-06-22 DIAGNOSIS — I50812 Chronic right heart failure: Secondary | ICD-10-CM

## 2018-06-22 DIAGNOSIS — J84112 Idiopathic pulmonary fibrosis: Secondary | ICD-10-CM

## 2018-06-22 NOTE — Telephone Encounter (Signed)
Okay to go ahead and put in the referral for both

## 2018-06-22 NOTE — Telephone Encounter (Signed)
Pt family called : They want referral to [facility] for cardio and for pulmon.  They are not happy with the DRs in [place], [place].  She is aware of referral process and will call back in a week if she has not heard from [place].

## 2018-06-25 NOTE — Telephone Encounter (Signed)
Both referrals placed.

## 2018-06-29 ENCOUNTER — Telehealth: Payer: Self-pay | Admitting: Internal Medicine

## 2018-06-29 DIAGNOSIS — J438 Other emphysema: Secondary | ICD-10-CM

## 2018-06-29 NOTE — Telephone Encounter (Signed)
That's fine

## 2018-06-29 NOTE — Telephone Encounter (Signed)
Spoke with Diane at [facility]. States that pt has not heard about his home health referral. Advised her that we placed the order for this back a few weeks ago. After looking into the pt's chart, the order was canceled due to [facility] declining the referral due to the pt living in an area they don't service. A new order will be placed for this. Diane told us that the pt was very upset with the care that he was receiving from our office. I contacted the pt and let him know that we are working on this for him and we will contact him as soon as we hear something.

## 2018-06-29 NOTE — Telephone Encounter (Signed)
Lauren is needing to know if patient needs nursing as well. MW please advise, thank you.

## 2018-06-29 NOTE — Telephone Encounter (Signed)
Called Lauren back-also spoke with [employer]. Both are aware that MW is fine with nursing as well. Nothing more needed at this time.

## 2018-07-02 ENCOUNTER — Telehealth: Payer: Self-pay | Admitting: Internal Medicine

## 2018-07-02 DIAGNOSIS — J449 Chronic obstructive pulmonary disease, unspecified: Secondary | ICD-10-CM

## 2018-07-02 NOTE — Telephone Encounter (Signed)
Order has been placed for pulm rehab and specified in there for pt to do home pulm rehab with the company [facility].  Nothing further needed.

## 2018-07-02 NOTE — Telephone Encounter (Signed)
Do a home pulmonary rehab consult with the company [facility]  " HHS eval and treatment" for copd dx   - the coordinator is Philis Fendt at [phone]

## 2018-07-02 NOTE — Telephone Encounter (Signed)
Dr Sherene Sires- pt did not qualify for home PT, are you ok with ordering outpt PT?

## 2018-07-04 ENCOUNTER — Telehealth: Payer: Self-pay | Admitting: Internal Medicine

## 2018-07-04 DIAGNOSIS — J441 Chronic obstructive pulmonary disease with (acute) exacerbation: Secondary | ICD-10-CM

## 2018-07-04 DIAGNOSIS — J449 Chronic obstructive pulmonary disease, unspecified: Secondary | ICD-10-CM

## 2018-07-04 DIAGNOSIS — I50812 Chronic right heart failure: Secondary | ICD-10-CM

## 2018-07-04 NOTE — Telephone Encounter (Signed)
Order has been placed. Pt is aware. Nothing further was needed. 

## 2018-07-04 NOTE — Telephone Encounter (Signed)
Fine with me - all other orders though should be thru his pcp unless directly pulmonary related

## 2018-07-04 NOTE — Telephone Encounter (Signed)
Called and spoke with pt regarding O2 sats dropping in last 4 days. Pt was seen by home health nurse in [place] on Monday 07/02/18 She advised pt that since O2 sats drop into 70's when standing then sitting Pt does not qualify for home bound care, but is advised to try PT before pulm rehab  Pt is requesting an order for PT near him in [place].  MW please advise

## 2018-07-09 ENCOUNTER — Telehealth (HOSPITAL_COMMUNITY): Payer: Self-pay | Admitting: *Deleted

## 2018-07-09 NOTE — Addendum Note (Signed)
Addended by: Sheran Luz on: 07/09/2018 10:56 AM   Modules accepted: Orders

## 2018-07-09 NOTE — Telephone Encounter (Signed)
Adendum: [facility]) is needing a new order for pt's PT. I spoke with Cordelia Pen, [facility], and was advised a new order needs to be placed. Order placed. Cordelia Pen is aware and will get pt's PT order resolved. Will sign off.

## 2018-07-09 NOTE — Telephone Encounter (Signed)
Patient called regarding TEE results and needing PFO closed.  Per Dr. Shirlee Latch no benefits for closing PFO.  Patient is aware and no further questions.

## 2018-07-10 ENCOUNTER — Telehealth: Payer: Self-pay | Admitting: Internal Medicine

## 2018-07-10 ENCOUNTER — Other Ambulatory Visit: Payer: Self-pay

## 2018-07-10 ENCOUNTER — Ambulatory Visit: Payer: Self-pay | Admitting: Thoracic Surgery (Cardiothoracic Vascular Surgery)

## 2018-07-10 NOTE — Telephone Encounter (Signed)
Spoke with Zachary [name] at [facility], aware of res.  Requests that these recs be faxed to her at [phone].  This has been faxed.  Nothing further needed.

## 2018-07-10 NOTE — Telephone Encounter (Signed)
Stop losartan and use SBP > 75 as long as pt not having light headedness as parameter   Any further questions re bp to dr Shirlee Latch

## 2018-07-10 NOTE — Telephone Encounter (Signed)
Called and informed the pt regarding the Losartan, pt verbalized understanding.   Dr. Sherene Sires, regarding the vital signs parameters this is for the pt's PT/rehab. The pt is unable to perform PT duties if the 'standard' parameters are used. For example, Elease Hashimoto with [facility] states PT will need to be stopped if pt's HR exceeds 100 or if their spO2 is <90%. Please advise what parameters you would like to use for pt's HR, BP and spO2 when performing PT tasks. Thanks.

## 2018-07-10 NOTE — Telephone Encounter (Signed)
Spoke to [employer] with [facility] and was advised pt's BP is 76/53, HR 93, spO2 is 86 on 4lpm. Pt states he feels at his baseline but states he has been rushing around today and this is what happens to his BP and spO2 when he has a lot of activity. Elease Hashimoto is there for an initial assessment prior to pt's PT. Elease Hashimoto states if Dr. Sherene Sires would like to continue with PT then the nurse will need different parameters otherwise PT will not be able to do performed because of such abnormal vitals. Pt denies any new signs or symptoms. Pt denied dizziness, swelling, n/v/d, f/c/s and pt was alert and oriented.   Dr. Sherene Sires please advise further. Thanks.

## 2018-07-10 NOTE — Telephone Encounter (Signed)
sats need to be > 85 % and HR < 160 at all times

## 2018-07-13 NOTE — Telephone Encounter (Signed)
Pt aware of results See phone note 07/09/18

## 2018-08-10 ENCOUNTER — Ambulatory Visit: Payer: Self-pay | Admitting: Internal Medicine

## 2018-08-12 ENCOUNTER — Other Ambulatory Visit: Payer: Self-pay | Admitting: Family Medicine

## 2018-08-12 DIAGNOSIS — J441 Chronic obstructive pulmonary disease with (acute) exacerbation: Secondary | ICD-10-CM

## 2018-08-15 ENCOUNTER — Other Ambulatory Visit: Payer: Self-pay | Admitting: Family Medicine

## 2018-08-15 DIAGNOSIS — J441 Chronic obstructive pulmonary disease with (acute) exacerbation: Secondary | ICD-10-CM

## 2018-08-22 ENCOUNTER — Telehealth: Payer: Self-pay | Admitting: Family Medicine

## 2018-08-22 DIAGNOSIS — K219 Gastro-esophageal reflux disease without esophagitis: Secondary | ICD-10-CM

## 2018-08-23 NOTE — Telephone Encounter (Signed)
Go ahead and do a referral to gastroenterology, diagnosis GERD uncontrolled

## 2018-08-24 NOTE — Telephone Encounter (Signed)
Referral order placed to GI, patient aware

## 2018-08-29 ENCOUNTER — Other Ambulatory Visit (HOSPITAL_COMMUNITY): Payer: Self-pay | Admitting: Nephrology

## 2018-08-29 DIAGNOSIS — N183 Chronic kidney disease, stage 3 unspecified: Secondary | ICD-10-CM

## 2018-08-31 ENCOUNTER — Ambulatory Visit (HOSPITAL_COMMUNITY): Payer: Medicare Other | Attending: Nephrology

## 2018-09-06 ENCOUNTER — Ambulatory Visit (INDEPENDENT_AMBULATORY_CARE_PROVIDER_SITE_OTHER): Payer: Self-pay | Admitting: Internal Medicine

## 2018-09-18 DEATH — deceased

## 2018-11-08 ENCOUNTER — Other Ambulatory Visit: Payer: Self-pay | Admitting: Family Medicine
